# Patient Record
Sex: Female | Born: 1955 | Race: White | Hispanic: No | Marital: Married | Smoking: Never smoker
Health system: Southern US, Community
[De-identification: ages and names within clinical notes are randomized; demographics above are authoritative.]

## PROBLEM LIST (undated history)

## (undated) DIAGNOSIS — F419 Anxiety disorder, unspecified: Secondary | ICD-10-CM

## (undated) DIAGNOSIS — R51 Headache: Secondary | ICD-10-CM

## (undated) DIAGNOSIS — Z1211 Encounter for screening for malignant neoplasm of colon: Secondary | ICD-10-CM

## (undated) DIAGNOSIS — Z87442 Personal history of urinary calculi: Secondary | ICD-10-CM

## (undated) DIAGNOSIS — H269 Unspecified cataract: Secondary | ICD-10-CM

## (undated) DIAGNOSIS — H6690 Otitis media, unspecified, unspecified ear: Secondary | ICD-10-CM

## (undated) DIAGNOSIS — G709 Myoneural disorder, unspecified: Secondary | ICD-10-CM

## (undated) DIAGNOSIS — M1990 Unspecified osteoarthritis, unspecified site: Secondary | ICD-10-CM

## (undated) DIAGNOSIS — E039 Hypothyroidism, unspecified: Secondary | ICD-10-CM

## (undated) DIAGNOSIS — Z124 Encounter for screening for malignant neoplasm of cervix: Secondary | ICD-10-CM

## (undated) DIAGNOSIS — Z1239 Encounter for other screening for malignant neoplasm of breast: Secondary | ICD-10-CM

## (undated) DIAGNOSIS — R011 Cardiac murmur, unspecified: Secondary | ICD-10-CM

## (undated) DIAGNOSIS — Z8669 Personal history of other diseases of the nervous system and sense organs: Secondary | ICD-10-CM

## (undated) DIAGNOSIS — D689 Coagulation defect, unspecified: Secondary | ICD-10-CM

## (undated) DIAGNOSIS — T7840XA Allergy, unspecified, initial encounter: Secondary | ICD-10-CM

## (undated) DIAGNOSIS — R519 Headache, unspecified: Secondary | ICD-10-CM

## (undated) HISTORY — DX: Hypothyroidism, unspecified: E03.9

## (undated) HISTORY — PX: SPINE SURGERY: SHX786

## (undated) HISTORY — DX: Hypercalcemia: E83.52

## (undated) HISTORY — PX: PARTIAL HYSTERECTOMY: SHX80

## (undated) HISTORY — PX: JOINT REPLACEMENT: SHX530

## (undated) HISTORY — DX: Encounter for screening for malignant neoplasm of cervix: Z12.4

## (undated) HISTORY — DX: Anxiety disorder, unspecified: F41.9

## (undated) HISTORY — DX: Allergy, unspecified, initial encounter: T78.40XA

## (undated) HISTORY — DX: Encounter for screening for malignant neoplasm of colon: Z12.11

## (undated) HISTORY — PX: TOTAL ABDOMINAL HYSTERECTOMY: SHX209

## (undated) HISTORY — PX: CARDIAC VALVE REPLACEMENT: SHX585

## (undated) HISTORY — DX: Unspecified cataract: H26.9

## (undated) HISTORY — DX: Otitis media, unspecified, unspecified ear: H66.90

## (undated) HISTORY — PX: ANTERIOR CERVICAL DECOMP/DISCECTOMY FUSION: SHX1161

## (undated) HISTORY — DX: Coagulation defect, unspecified: D68.9

## (undated) HISTORY — DX: Unspecified osteoarthritis, unspecified site: M19.90

## (undated) HISTORY — DX: Encounter for other screening for malignant neoplasm of breast: Z12.39

## (undated) HISTORY — PX: OTHER SURGICAL HISTORY: SHX169

## (undated) HISTORY — DX: Personal history of other diseases of the nervous system and sense organs: Z86.69

## (undated) HISTORY — DX: Myoneural disorder, unspecified: G70.9

---

## 1987-09-14 HISTORY — PX: CHOLECYSTECTOMY: SHX55

## 2005-02-05 ENCOUNTER — Ambulatory Visit: Payer: Self-pay | Admitting: Unknown Physician Specialty

## 2005-02-10 ENCOUNTER — Ambulatory Visit: Payer: Self-pay | Admitting: Unknown Physician Specialty

## 2006-03-17 ENCOUNTER — Ambulatory Visit: Payer: Self-pay | Admitting: Unknown Physician Specialty

## 2007-04-18 ENCOUNTER — Ambulatory Visit: Payer: Self-pay | Admitting: Unknown Physician Specialty

## 2008-04-19 ENCOUNTER — Ambulatory Visit: Payer: Self-pay | Admitting: Unknown Physician Specialty

## 2008-05-03 ENCOUNTER — Ambulatory Visit: Payer: Self-pay | Admitting: Internal Medicine

## 2008-05-17 ENCOUNTER — Ambulatory Visit: Payer: Self-pay | Admitting: Internal Medicine

## 2008-06-07 LAB — HM COLONOSCOPY: HM Colonoscopy: NORMAL

## 2008-09-19 ENCOUNTER — Ambulatory Visit: Payer: Self-pay | Admitting: Internal Medicine

## 2009-04-22 ENCOUNTER — Ambulatory Visit: Payer: Self-pay | Admitting: Unknown Physician Specialty

## 2009-12-04 ENCOUNTER — Ambulatory Visit: Payer: Self-pay

## 2010-01-11 HISTORY — PX: MENISCUS REPAIR: SHX5179

## 2010-01-23 ENCOUNTER — Ambulatory Visit: Payer: Self-pay | Admitting: General Practice

## 2010-01-30 ENCOUNTER — Ambulatory Visit: Payer: Self-pay | Admitting: General Practice

## 2010-03-13 LAB — HM MAMMOGRAPHY: HM Mammogram: NORMAL

## 2010-06-12 ENCOUNTER — Ambulatory Visit: Payer: Self-pay | Admitting: Unknown Physician Specialty

## 2010-10-07 ENCOUNTER — Ambulatory Visit: Payer: Self-pay | Admitting: Internal Medicine

## 2010-10-09 LAB — HM DEXA SCAN

## 2011-06-23 ENCOUNTER — Ambulatory Visit: Payer: Self-pay | Admitting: Unknown Physician Specialty

## 2011-07-10 LAB — HM MAMMOGRAPHY: HM Mammogram: NORMAL

## 2011-09-02 ENCOUNTER — Telehealth: Payer: Self-pay | Admitting: Internal Medicine

## 2011-09-02 NOTE — Telephone Encounter (Signed)
[phone] Pt called is schedule to be here 09/17/11 for cpx but she will be out of her  liboxel grams  1 daily [facility] [address] [facility] get [facility] so you will know exactly what shes taking

## 2011-09-02 NOTE — Telephone Encounter (Signed)
Will wait to receive fax from pharmacy .

## 2011-09-09 ENCOUNTER — Other Ambulatory Visit: Payer: Self-pay | Admitting: *Deleted

## 2011-09-09 MED ORDER — LEVOTHYROXINE SODIUM 88 MCG PO TABS
88.0000 ug | ORAL_TABLET | Freq: Every day | ORAL | Status: DC
Start: 2011-09-09 — End: 2012-11-24

## 2011-09-09 NOTE — Telephone Encounter (Signed)
Faxed refill request from [facility] [address], last filled 06/10/11.

## 2011-09-10 ENCOUNTER — Encounter: Payer: Self-pay | Admitting: Internal Medicine

## 2011-09-14 HISTORY — PX: ANTERIOR FUSION CERVICAL SPINE: SUR626

## 2011-09-15 NOTE — Telephone Encounter (Signed)
Rx has been done. 

## 2011-09-17 ENCOUNTER — Encounter: Payer: Self-pay | Admitting: Internal Medicine

## 2011-09-17 ENCOUNTER — Ambulatory Visit (INDEPENDENT_AMBULATORY_CARE_PROVIDER_SITE_OTHER): Payer: PRIVATE HEALTH INSURANCE | Admitting: Internal Medicine

## 2011-09-17 DIAGNOSIS — Z1239 Encounter for other screening for malignant neoplasm of breast: Secondary | ICD-10-CM

## 2011-09-17 DIAGNOSIS — Z1211 Encounter for screening for malignant neoplasm of colon: Secondary | ICD-10-CM

## 2011-09-17 DIAGNOSIS — R5383 Other fatigue: Secondary | ICD-10-CM

## 2011-09-17 DIAGNOSIS — D649 Anemia, unspecified: Secondary | ICD-10-CM

## 2011-09-17 DIAGNOSIS — Z23 Encounter for immunization: Secondary | ICD-10-CM

## 2011-09-17 DIAGNOSIS — E039 Hypothyroidism, unspecified: Secondary | ICD-10-CM

## 2011-09-17 DIAGNOSIS — R5381 Other malaise: Secondary | ICD-10-CM

## 2011-09-17 DIAGNOSIS — Z124 Encounter for screening for malignant neoplasm of cervix: Secondary | ICD-10-CM

## 2011-09-17 LAB — COMPREHENSIVE METABOLIC PANEL
ALT: 20 U/L (ref 0–35)
AST: 24 U/L (ref 0–37)
Albumin: 4.4 g/dL (ref 3.5–5.2)
Alkaline Phosphatase: 70 U/L (ref 39–117)
Glucose, Bld: 87 mg/dL (ref 70–99)
Potassium: 4.2 mEq/L (ref 3.5–5.1)
Sodium: 143 mEq/L (ref 135–145)
Total Bilirubin: 0.5 mg/dL (ref 0.3–1.2)
Total Protein: 6.8 g/dL (ref 6.0–8.3)

## 2011-09-17 LAB — CBC WITH DIFFERENTIAL/PLATELET
Eosinophils Absolute: 0.1 10*3/uL (ref 0.0–0.7)
Eosinophils Relative: 2.1 % (ref 0.0–5.0)
HCT: 36.9 % (ref 36.0–46.0)
Lymphs Abs: 1.3 10*3/uL (ref 0.7–4.0)
MCHC: 34.1 g/dL (ref 30.0–36.0)
MCV: 91.8 fl (ref 78.0–100.0)
Monocytes Absolute: 0.4 10*3/uL (ref 0.1–1.0)
Neutrophils Relative %: 69 % (ref 43.0–77.0)
Platelets: 259 10*3/uL (ref 150.0–400.0)
WBC: 6.3 10*3/uL (ref 4.5–10.5)

## 2011-09-17 LAB — IRON AND TIBC: TIBC: 336 ug/dL (ref 250–470)

## 2011-09-17 LAB — TSH: TSH: 1.76 u[IU]/mL (ref 0.35–5.50)

## 2011-09-17 LAB — FERRITIN: Ferritin: 27.9 ng/mL (ref 10.0–291.0)

## 2011-09-17 NOTE — Patient Instructions (Signed)
I recommend that you join a gym for weight lifiting and aerobics to preserve your good health and increase your bone density

## 2011-09-17 NOTE — Progress Notes (Signed)
Subjective:    Patient ID: Theresa [name], female    DOB: 1956/01/30, 56 y.o.   MRN: [id number]  HPI Annual Exam-Postmenopausal Patient presents for annual exam. The patient has no complaints today. The patient is sexually active. GYN screening history: last pap: approximate date July 2012 and was normal and last mammogram: approximate date OCTOBER 2012 and was normal. The patient is not taking hormone replacement therapy. Patient denies post-menopausal vaginal bleeding. The patient wears seatbelts: yes. The patient participates in regular exercise: yes. Has the patient ever been transfused or tattooed?: no. The patient reports that there is not domestic violence in her life.  Past Medical History  Diagnosis Date  . Unspecified otitis media   . Hypercalcemia   . Breast screening, unspecified   . Special screening for malignant neoplasms, colon   . Screening for malignant neoplasm of the cervix   . Unspecified hypothyroidism     thyroid nodule  . H/O: Bell's palsy   . Osteoporosis     Past Surgical History  Procedure Date  . Partial hysterectomy     secondary to fibroids  . Meniscus repair May 2011    [facility]  . Cholecystectomy 1989  . Dental implant       Review of Systems  Constitutional: Negative for fever, chills, appetite change, fatigue and unexpected weight change.  HENT: Negative for ear pain, congestion, sore throat, trouble swallowing, neck pain, voice change and sinus pressure.   Eyes: Negative for visual disturbance.  Respiratory: Negative for cough, shortness of breath, wheezing and stridor.   Cardiovascular: Negative for chest pain, palpitations and leg swelling.  Gastrointestinal: Negative for nausea, vomiting, abdominal pain, diarrhea, constipation, blood in stool, abdominal distention and anal bleeding.  Genitourinary: Negative for dysuria, flank pain, vaginal bleeding, vaginal discharge, difficulty urinating and vaginal pain.  Musculoskeletal: Negative for  myalgias, arthralgias and gait problem.  Skin: Negative for color change and rash.  Neurological: Negative for dizziness and headaches.  Hematological: Negative for adenopathy. Does not bruise/bleed easily.  Psychiatric/Behavioral: Negative for suicidal ideas, sleep disturbance and dysphoric mood. The patient is not nervous/anxious.    BP 110/64  Pulse 64  Temp(Src) 98.3 F (36.8 C) (Oral)  Wt 141 lb (63.957 kg)     Objective:   Physical Exam  General Appearance:    Alert, cooperative, no distress, appears stated age Head:    Normocephalic, without obvious abnormality, atraumatic Eyes:    PERRL, conjunctiva/corneas clear, EOM's intact, fundi    benign, both eyes Ears:    Normal TM's and external ear canals, both ears Nose:   Nares normal, septum midline, mucosa normal, no drainage    or sinus tenderness Throat:   Lips, mucosa, and tongue normal; teeth and gums normal Neck:   Supple, symmetrical, trachea midline, no adenopathy;    thyroid:  no enlargement/tenderness/nodules; no carotid   bruit or JVD Back:     Symmetric, no curvature, ROM normal, no CVA tenderness Lungs:     Clear to auscultation bilaterally, respirations unlabored Chest Wall:    No tenderness or deformity  Heart:    Regular rate and rhythm, S1 and S2 normal, no murmur, rub   or gallop Abdomen:     Soft, non-tender, bowel sounds active all four quadrants,    no masses, no organomegaly Extremities:   Extremities normal, atraumatic, no cyanosis or edema Pulses:   2+ and symmetric all extremities Skin:   Skin color, texture, turgor normal, no rashes or lesions Lymph nodes:  Cervical, supraclavicular, and axillary nodes normal Neurologic:   CNII-XII intact, normal strength, sensation and reflexes    throughout       Assessment & Plan:   Unspecified hypothyroidism Managed with Synthroid,  TSH due  Screening for cervical cancer fone every 2 to 3 years, cervicx intact per patient.  Last one done July 2012 by  [facility]  Breast screening, unspecified Mammogram and bresat exam were done by [facility] July/Oct 2012.  Special screening for malignant neoplasms, colon Done July 2009.  Normal, Follow up in 2019.    Updated Medication List Outpatient Encounter Prescriptions as of 09/17/2011  Medication Sig Dispense Refill  . aspirin 81 MG tablet Take 81 mg by mouth daily.        [name] levothyroxine (SYNTHROID, LEVOTHROID) 88 MCG tablet Take 1 tablet (88 mcg total) by mouth daily.  90 tablet  5  . Multiple Vitamin (MULTIVITAMIN) tablet Take 1 tablet by mouth daily.        . Calcium Carbonate-Vitamin D (CALCIUM 600+D) 600-400 MG-UNIT per tablet Take by mouth as directed.       . Cholecalciferol (VITAMIN D3) 1000 UNITS CAPS Take by mouth daily.        . Coenzyme Q10 (CO Q10) 100 MG CAPS Take by mouth daily.            Subjective:          Review of Systems    Objective:        Assessment:      Plan:     See After Visit Summary for Counseling Recommendations

## 2011-09-18 DIAGNOSIS — Z1211 Encounter for screening for malignant neoplasm of colon: Secondary | ICD-10-CM | POA: Insufficient documentation

## 2011-09-18 DIAGNOSIS — Z124 Encounter for screening for malignant neoplasm of cervix: Secondary | ICD-10-CM | POA: Insufficient documentation

## 2011-09-18 DIAGNOSIS — Z1231 Encounter for screening mammogram for malignant neoplasm of breast: Secondary | ICD-10-CM | POA: Insufficient documentation

## 2011-09-18 DIAGNOSIS — E039 Hypothyroidism, unspecified: Secondary | ICD-10-CM | POA: Insufficient documentation

## 2011-09-18 DIAGNOSIS — Z1239 Encounter for other screening for malignant neoplasm of breast: Secondary | ICD-10-CM | POA: Insufficient documentation

## 2011-09-18 NOTE — Assessment & Plan Note (Signed)
Mammogram and bresat exam were done by [facility] July/Oct 2012.

## 2011-09-18 NOTE — Assessment & Plan Note (Signed)
Managed with Synthroid,  TSH due

## 2011-09-18 NOTE — Assessment & Plan Note (Signed)
fone every 2 to 3 years, cervicx intact per patient.  Last one done July 2012 by [facility]

## 2011-09-18 NOTE — Assessment & Plan Note (Signed)
Done July 2009.  Normal, Follow up in 2019.

## 2012-04-13 DIAGNOSIS — M5030 Other cervical disc degeneration, unspecified cervical region: Secondary | ICD-10-CM | POA: Insufficient documentation

## 2012-05-04 ENCOUNTER — Encounter: Payer: Self-pay | Admitting: Internal Medicine

## 2012-06-09 LAB — HM MAMMOGRAPHY: HM Mammogram: NORMAL

## 2012-10-23 ENCOUNTER — Encounter: Payer: Self-pay | Admitting: Internal Medicine

## 2012-10-23 DIAGNOSIS — M47812 Spondylosis without myelopathy or radiculopathy, cervical region: Secondary | ICD-10-CM | POA: Insufficient documentation

## 2012-11-24 ENCOUNTER — Telehealth: Payer: Self-pay | Admitting: Internal Medicine

## 2012-11-24 MED ORDER — LEVOTHYROXINE SODIUM 88 MCG PO TABS: 88.0000 ug | ORAL_TABLET | Freq: Every day | ORAL | Status: DC

## 2012-11-24 NOTE — Telephone Encounter (Signed)
See unrouted note.  

## 2012-11-24 NOTE — Telephone Encounter (Signed)
Yes ok to fil for 30 days levothyroxine 88 mcg .  E mailed.

## 2012-11-24 NOTE — Telephone Encounter (Signed)
Please advise if ok to foll with #30 and 0 refills to cover until her appt?

## 2012-11-24 NOTE — Telephone Encounter (Signed)
levothyroxine (SYNTHROID, LEVOTHROID) 88 MCG tablet   Patient has scheduled her physical ,but will run out of her medication next week.  Please put lab work in for her physical . She is schedule for the lab on 4.21.14

## 2012-11-24 NOTE — Telephone Encounter (Signed)
Med filled.  

## 2012-12-15 ENCOUNTER — Encounter: Payer: Self-pay | Admitting: Internal Medicine

## 2012-12-15 ENCOUNTER — Ambulatory Visit (INDEPENDENT_AMBULATORY_CARE_PROVIDER_SITE_OTHER): Payer: PRIVATE HEALTH INSURANCE | Admitting: Internal Medicine

## 2012-12-15 VITALS — BP 117/70 | HR 57 | Temp 98.1°F | Resp 15 | Wt 131.5 lb

## 2012-12-15 DIAGNOSIS — E039 Hypothyroidism, unspecified: Secondary | ICD-10-CM

## 2012-12-15 DIAGNOSIS — E010 Iodine-deficiency related diffuse (endemic) goiter: Secondary | ICD-10-CM

## 2012-12-15 DIAGNOSIS — E049 Nontoxic goiter, unspecified: Secondary | ICD-10-CM

## 2012-12-15 DIAGNOSIS — M47812 Spondylosis without myelopathy or radiculopathy, cervical region: Secondary | ICD-10-CM

## 2012-12-15 NOTE — Progress Notes (Signed)
Patient ID: Theresa [name], female   DOB: [birth year], 57 y.o.   MRN: [id number]   Patient Active Problem List  Diagnosis  . Screening for cervical cancer  . Special screening for malignant neoplasms, colon  . Breast screening, unspecified  . Unspecified hypothyroidism  . Cervical spine degeneration  . Thyromegaly    Subjective:  CC:   Chief Complaint  Patient presents with  . Follow-up    Patient reports swelling in area of thyroid.    HPI:   Theresa [name] a 57 y.o. female who presents with Thyroid enlargement. Patient notes that ever since her cervical spine fusion which was done in December 2013 by Dr. Jeral Fruit, she has noticed an asymmetric enlargement of her thyroid and a feeling of thickness in her esophagus which Dr. Jeral Fruit told her would remain for several months to a year after surgery. She's not having a dysphagia  . She has  a strong family history of nodules and goiters.  She is endorsing several symptoms of hypothyroidism which are nonspecific including dry skin and fatigue. She has not gained any weight. She has not had an excessive amount of hair loss. Unfortunately her neck pain has returned and she is now having epidural steroid injections. She is considering retiring early.   Past Medical History  Diagnosis Date  . Unspecified otitis media   . Hypercalcemia   . Breast screening, unspecified   . Special screening for malignant neoplasms, colon   . Screening for malignant neoplasm of the cervix   . Unspecified hypothyroidism     thyroid nodule  . H/O: Bell's palsy   . Osteoporosis     Past Surgical History  Procedure Laterality Date  . Partial hysterectomy      secondary to fibroids  . Meniscus repair  May 2011    [facility]  . Cholecystectomy  1989  . Dental implant    . Anterior fusion cervical spine  jan 2014    [facility]  c5-c6       The following portions of the patient's history were reviewed and updated as appropriate: Allergies, current medications,  and problem list.    Review of Systems:   Patient denies headache, fevers, malaise, unintentional weight loss, skin rash, eye pain, sinus congestion and sinus pain, sore throat, dysphagia,  hemoptysis , cough, dyspnea, wheezing, chest pain, palpitations, orthopnea, edema, abdominal pain, nausea, melena, diarrhea, constipation, flank pain, dysuria, hematuria, urinary  Frequency, nocturia, numbness, tingling, seizures,  Focal weakness, Loss of consciousness,  Tremor, insomnia, depression, anxiety, and suicidal ideation.     History   Social History  . Marital Status: Married    Spouse Name: N/A    Number of Children: N/A  . Years of Education: N/A   Occupational History  . dental hygienist    Social History Main Topics  . Smoking status: Never Smoker   . Smokeless tobacco: Not on file  . Alcohol Use: 3.5 oz/week    7 drink(s) per week     Comment: Occasional  . Drug Use: Not on file  . Sexually Active: Not on file   Other Topics Concern  . Not on file   Social History Narrative   Lives with spouse, son is at [facility].    Objective:  BP 117/70  Pulse 57  Temp(Src) 98.1 F (36.7 C) (Oral)  Resp 15  Wt 131 lb 8 oz (59.648 kg)  SpO2 98%  General appearance: alert, cooperative and appears stated age Ears: normal TM's and external  ear canals both ears Throat: lips, mucosa, and tongue normal; teeth and gums normal Neck: no adenopathy, no carotid bruit, supple, symmetrical, trachea midline and thyroid asymmetri, c larger on right, no nodules  Back: symmetric, no curvature. ROM normal. No CVA tenderness. Lungs: clear to auscultation bilaterally Heart: regular rate and rhythm, S1, S2 normal, no murmur, click, rub or gallop Abdomen: soft, non-tender; bowel sounds normal; no masses,  no organomegaly Pulses: 2+ and symmetric Skin: Skin color, texture, turgor normal. No rashes or lesions Lymph nodes: Cervical, supraclavicular, and axillary nodes normal.  Assessment and  Plan:  Cervical spine degeneration She underwent surgery by Dr. Jeral Fruit in December 2013 and was symptom-free for a few months but is now having recurrence of pain requiring epidural injections.  Thyromegaly She does appear to have thyroid enlargement on the right side today. However it is difficult determine whether the swelling is due to soft tissue swelling from her recent anterior cervical spine fusion. I have ordered a neck ultrasound and repeat thyroid function tests.   A total of 25 minutes was spent with patient more than half of which was spent in counseling, reviewing records from other providers and coordination of care.  Updated Medication List Outpatient Encounter Prescriptions as of 12/15/2012  Medication Sig Dispense Refill  . Calcium Carbonate-Vitamin D (CALCIUM 600+D) 600-400 MG-UNIT per tablet Take by mouth as directed.       . Cholecalciferol (VITAMIN D3) 1000 UNITS CAPS Take by mouth daily.        [name] levothyroxine (SYNTHROID, LEVOTHROID) 88 MCG tablet Take 1 tablet (88 mcg total) by mouth daily.  30 tablet  0  . Multiple Vitamin (MULTIVITAMIN) tablet Take 1 tablet by mouth daily.        [name] aspirin 81 MG tablet Take 81 mg by mouth daily.        . Coenzyme Q10 (CO Q10) 100 MG CAPS Take by mouth daily.         No facility-administered encounter medications on file as of 12/15/2012.     Orders Placed This Encounter  Procedures  . US Soft Tissue Head/Neck  . TSH + free T4    No Follow-up on file.

## 2012-12-16 ENCOUNTER — Encounter: Payer: Self-pay | Admitting: Internal Medicine

## 2012-12-16 DIAGNOSIS — E010 Iodine-deficiency related diffuse (endemic) goiter: Secondary | ICD-10-CM | POA: Insufficient documentation

## 2012-12-16 NOTE — Assessment & Plan Note (Signed)
She underwent surgery by Dr. Jeral Fruit in December 2013 and was symptom-free for a few months but is now having recurrence of pain requiring epidural injections.

## 2012-12-16 NOTE — Assessment & Plan Note (Signed)
She does appear to have thyroid enlargement on the right side today. However it is difficult determine whether the swelling is due to soft tissue swelling from her recent anterior cervical spine fusion. I have ordered a neck ultrasound and repeat thyroid function tests.

## 2012-12-18 ENCOUNTER — Encounter: Payer: Self-pay | Admitting: Internal Medicine

## 2012-12-25 ENCOUNTER — Encounter: Payer: Self-pay | Admitting: Internal Medicine

## 2012-12-25 ENCOUNTER — Ambulatory Visit (HOSPITAL_COMMUNITY)
Admission: RE | Admit: 2012-12-25 | Discharge: 2012-12-25 | Disposition: A | Discharge status: Home / Self Care | Payer: PRIVATE HEALTH INSURANCE | Source: Ambulatory Visit | Attending: Internal Medicine | Admitting: Internal Medicine

## 2012-12-25 DIAGNOSIS — E010 Iodine-deficiency related diffuse (endemic) goiter: Secondary | ICD-10-CM

## 2012-12-25 DIAGNOSIS — E049 Nontoxic goiter, unspecified: Secondary | ICD-10-CM | POA: Insufficient documentation

## 2013-01-01 ENCOUNTER — Other Ambulatory Visit: Payer: Self-pay | Admitting: Internal Medicine

## 2013-01-01 ENCOUNTER — Other Ambulatory Visit (INDEPENDENT_AMBULATORY_CARE_PROVIDER_SITE_OTHER): Payer: PRIVATE HEALTH INSURANCE

## 2013-01-01 DIAGNOSIS — R5381 Other malaise: Secondary | ICD-10-CM

## 2013-01-01 DIAGNOSIS — E785 Hyperlipidemia, unspecified: Secondary | ICD-10-CM

## 2013-01-01 NOTE — Telephone Encounter (Signed)
Rx sent to pharmacy by escript  

## 2013-01-02 ENCOUNTER — Telehealth: Payer: Self-pay | Admitting: *Deleted

## 2013-01-02 LAB — COMPREHENSIVE METABOLIC PANEL
ALT: 20 U/L (ref 0–35)
AST: 21 U/L (ref 0–37)
Alkaline Phosphatase: 59 U/L (ref 39–117)
Calcium: 9.2 mg/dL (ref 8.4–10.5)
Chloride: 105 mEq/L (ref 96–112)
Creatinine, Ser: 0.8 mg/dL (ref 0.4–1.2)
Potassium: 4.2 mEq/L (ref 3.5–5.1)

## 2013-01-02 LAB — CBC WITH DIFFERENTIAL/PLATELET
Basophils Absolute: 0 10*3/uL (ref 0.0–0.1)
Basophils Relative: 0.2 % (ref 0.0–3.0)
Eosinophils Absolute: 0.2 10*3/uL (ref 0.0–0.7)
Hemoglobin: 14.4 g/dL (ref 12.0–15.0)
Lymphocytes Relative: 17.6 % (ref 12.0–46.0)
Lymphs Abs: 1.1 10*3/uL (ref 0.7–4.0)
MCHC: 33.4 g/dL (ref 30.0–36.0)
MCV: 92.1 fl (ref 78.0–100.0)
Monocytes Absolute: 0.6 10*3/uL (ref 0.1–1.0)
Neutro Abs: 4.2 10*3/uL (ref 1.4–7.7)
RDW: 14.2 % (ref 11.5–14.6)

## 2013-01-02 LAB — LIPID PANEL
Cholesterol: 207 mg/dL — ABNORMAL HIGH (ref 0–200)
HDL: 56.1 mg/dL (ref >39.00)
Total CHOL/HDL Ratio: 4
VLDL: 18.2 mg/dL (ref 0.0–40.0)

## 2013-01-02 LAB — TSH: TSH: 1.1 u[IU]/mL (ref 0.35–5.50)

## 2013-01-02 NOTE — Telephone Encounter (Signed)
I have tubes on the pt, but no labels on them or orders in the chart, what labs and dx?  Thank you   

## 2013-01-02 NOTE — Telephone Encounter (Signed)
I have tubes on the pt, but no labels on them or orders in the chart, what labs and dx?  Thank you

## 2013-01-02 NOTE — Telephone Encounter (Signed)
I did not order anything on this patient

## 2013-01-03 ENCOUNTER — Encounter: Payer: Self-pay | Admitting: Internal Medicine

## 2013-01-05 ENCOUNTER — Ambulatory Visit (INDEPENDENT_AMBULATORY_CARE_PROVIDER_SITE_OTHER): Payer: PRIVATE HEALTH INSURANCE | Admitting: Internal Medicine

## 2013-01-05 ENCOUNTER — Encounter: Payer: Self-pay | Admitting: Internal Medicine

## 2013-01-05 VITALS — BP 118/65 | HR 60 | Temp 98.7°F | Resp 16 | Ht 66.0 in | Wt 130.5 lb

## 2013-01-05 DIAGNOSIS — E039 Hypothyroidism, unspecified: Secondary | ICD-10-CM

## 2013-01-05 DIAGNOSIS — H409 Unspecified glaucoma: Secondary | ICD-10-CM

## 2013-01-05 DIAGNOSIS — E010 Iodine-deficiency related diffuse (endemic) goiter: Secondary | ICD-10-CM

## 2013-01-05 DIAGNOSIS — Z1239 Encounter for other screening for malignant neoplasm of breast: Secondary | ICD-10-CM

## 2013-01-05 DIAGNOSIS — Z0000 Encounter for general adult medical examination without abnormal findings: Secondary | ICD-10-CM

## 2013-01-05 DIAGNOSIS — E049 Nontoxic goiter, unspecified: Secondary | ICD-10-CM

## 2013-01-05 DIAGNOSIS — Z124 Encounter for screening for malignant neoplasm of cervix: Secondary | ICD-10-CM

## 2013-01-05 DIAGNOSIS — M47812 Spondylosis without myelopathy or radiculopathy, cervical region: Secondary | ICD-10-CM

## 2013-01-05 NOTE — Progress Notes (Signed)
Patient ID: Theresa [name], female   DOB: Sep 27, 1955, 57 y.o.   MRN: [id number]    Subjective:     Theresa [name] is a 57 y.o. female and is here for a comprehensive physical exam. The patient reports no problems.  History   Social History  . Marital Status: Married    Spouse Name: N/A    Number of Children: N/A  . Years of Education: N/A   Occupational History  . dental hygienist    Social History Main Topics  . Smoking status: Never Smoker   . Smokeless tobacco: Not on file  . Alcohol Use: 3.5 oz/week    7 drink(s) per week     Comment: Occasional  . Drug Use: Not on file  . Sexually Active: Not on file   Other Topics Concern  . Not on file   Social History Narrative   Lives with spouse, son is at [facility].   Health Maintenance  Topic Date Due  . Mammogram  06/16/2012  . Influenza Vaccine  05/14/2013  . Pap Smear  03/17/2014  . Colonoscopy  06/07/2018  . Tetanus/tdap  09/16/2021    The following portions of the patient's history were reviewed and updated as appropriate: allergies, current medications, past family history, past medical history, past social history, past surgical history and problem list.  Review of Systems A comprehensive review of systems was negative.   Objective:  BP 118/65  Pulse 60  Temp(Src) 98.7 F (37.1 C) (Oral)  Resp 16  Ht 5\' 6"  (1.676 m)  Wt 130 lb 8 oz (59.194 kg)  BMI 21.07 kg/m2  SpO2 98% General appearance: alert, cooperative and appears stated age Ears: normal TM's and external ear canals both ears Throat: lips, mucosa, and tongue normal; teeth and gums normal Neck: no adenopathy, no carotid bruit, supple, symmetrical, trachea midline and thyroid not enlarged, symmetric, no tenderness/mass/nodules Back: symmetric, no curvature. ROM normal. No CVA tenderness. Lungs: clear to auscultation bilaterally Heart: regular rate and rhythm, S1, S2 normal, no murmur, click, rub or gallop Abdomen: soft, non-tender; bowel sounds  normal; no masses,  no organomegaly Pulses: 2+ and symmetric Skin: Skin color, texture, turgor normal. No rashes or lesions Lymph nodes: Cervical, supraclavicular, and axillary nodes normal.    Assessment:   Breast screening, unspecified Mammograms and breast exams are ordered and done by her gynecologist.    Cervical spine degeneration Receiving periodic C2-C4 nerve blocks for return of symptoms post surgical decompression by [facility] Dec 2013. Theresa [name] is managed with diazepam and oxycodone prescribed by [facility].   Thyromegaly Thyroid ultrasound noted enlargement of right thyroid lobe without discrete nodules.  TSH is normal on current dose of synthroid .,  Will repeat both in 6 months   Unspecified hypothyroidism Thyroid function is normal on current dose. No changes today   Glaucoma (increased eye pressure) Managed by [facility],   Screening for cervical cancer Managed by GYN, [facility]. Normal per patient Sept 2013.  Routine general medical examination at a health care facility Annual comprehensive exam was done excluding breast, pelvic and PAP smear. All screenings have been addressed .    Updated Medication List Outpatient Encounter Prescriptions as of 01/05/2013  Medication Sig Dispense Refill  . Ascorbic Acid (VITAMIN C) 1000 MG tablet Take 1,000 mg by mouth daily.      [name] aspirin 81 MG tablet Take 81 mg by mouth daily.        . Cholecalciferol (VITAMIN D3) 1000 UNITS CAPS Take by  mouth daily.        [name] levothyroxine (SYNTHROID, LEVOTHROID) 88 MCG tablet TAKE ONE TABLET BY MOUTH ONCE DAILY  30 tablet  11  . Multiple Vitamin (MULTIVITAMIN) tablet Take 1 tablet by mouth daily.        . Calcium Carbonate-Vitamin D (CALCIUM 600+D) 600-400 MG-UNIT per tablet Take by mouth as directed.       . Coenzyme Q10 (CO Q10) 100 MG CAPS Take by mouth daily.         No facility-administered encounter medications on file as of 01/05/2013.

## 2013-01-05 NOTE — Patient Instructions (Addendum)
Your recent blood work was excellent.  i would like to repeat your thyroid ultrasound and thyroid function in 6 months

## 2013-01-07 ENCOUNTER — Encounter: Payer: Self-pay | Admitting: Internal Medicine

## 2013-01-07 DIAGNOSIS — Z0001 Encounter for general adult medical examination with abnormal findings: Secondary | ICD-10-CM | POA: Insufficient documentation

## 2013-01-07 DIAGNOSIS — H409 Unspecified glaucoma: Secondary | ICD-10-CM | POA: Insufficient documentation

## 2013-01-07 NOTE — Assessment & Plan Note (Signed)
Thyroid function is normal on current dose. No changes today  

## 2013-01-07 NOTE — Assessment & Plan Note (Addendum)
Receiving periodic C2-C4 nerve blocks for return of symptoms post surgical decompression by [facility] Dec 2013. Danielle Dess is managed with diazepam and oxycodone prescribed by [facility].

## 2013-01-07 NOTE — Assessment & Plan Note (Addendum)
Thyroid ultrasound noted enlargement of right thyroid lobe without discrete nodules.  TSH is normal on current dose of synthroid .,  Will repeat both in 6 months

## 2013-01-07 NOTE — Assessment & Plan Note (Signed)
Managed by GYN, [facility]. Normal per patient Sept 2013.

## 2013-01-07 NOTE — Assessment & Plan Note (Signed)
Managed by [facility],

## 2013-01-07 NOTE — Assessment & Plan Note (Signed)
Annual comprehensive exam was done excluding breast, pelvic and PAP smear. All screenings have been addressed .  

## 2013-01-07 NOTE — Assessment & Plan Note (Signed)
Mammograms and breast exams are ordered and done by her gynecologist.

## 2013-05-14 LAB — HM PAP SMEAR: HM Pap smear: NORMAL

## 2013-06-06 ENCOUNTER — Encounter: Payer: Self-pay | Admitting: Internal Medicine

## 2013-06-06 DIAGNOSIS — E010 Iodine-deficiency related diffuse (endemic) goiter: Secondary | ICD-10-CM

## 2013-06-06 DIAGNOSIS — E039 Hypothyroidism, unspecified: Secondary | ICD-10-CM

## 2013-06-08 ENCOUNTER — Encounter: Payer: Self-pay | Admitting: Emergency Medicine

## 2013-06-08 ENCOUNTER — Other Ambulatory Visit (INDEPENDENT_AMBULATORY_CARE_PROVIDER_SITE_OTHER): Payer: PRIVATE HEALTH INSURANCE

## 2013-06-08 DIAGNOSIS — E049 Nontoxic goiter, unspecified: Secondary | ICD-10-CM

## 2013-06-08 DIAGNOSIS — E039 Hypothyroidism, unspecified: Secondary | ICD-10-CM

## 2013-06-08 DIAGNOSIS — E010 Iodine-deficiency related diffuse (endemic) goiter: Secondary | ICD-10-CM

## 2013-06-09 LAB — TSH+FREE T4: TSH: 1.5 u[IU]/mL (ref 0.450–4.500)

## 2013-06-11 ENCOUNTER — Encounter: Payer: Self-pay | Admitting: Internal Medicine

## 2013-06-13 LAB — HM MAMMOGRAPHY: HM Mammogram: NORMAL

## 2013-06-15 ENCOUNTER — Ambulatory Visit: Payer: Self-pay | Admitting: Internal Medicine

## 2013-06-19 ENCOUNTER — Telehealth: Payer: Self-pay | Admitting: Internal Medicine

## 2013-06-19 ENCOUNTER — Encounter: Payer: Self-pay | Admitting: *Deleted

## 2013-06-19 NOTE — Telephone Encounter (Signed)
Her thyroid ultrasound was normal

## 2013-06-19 NOTE — Telephone Encounter (Signed)
Letter mailed

## 2013-06-30 ENCOUNTER — Encounter: Payer: Self-pay | Admitting: Internal Medicine

## 2013-07-06 ENCOUNTER — Encounter: Payer: Self-pay | Admitting: Internal Medicine

## 2013-07-06 DIAGNOSIS — M8580 Other specified disorders of bone density and structure, unspecified site: Secondary | ICD-10-CM

## 2013-07-14 LAB — HM DEXA SCAN

## 2013-07-19 ENCOUNTER — Other Ambulatory Visit: Payer: Self-pay

## 2013-08-02 ENCOUNTER — Ambulatory Visit: Payer: Self-pay | Admitting: Internal Medicine

## 2013-08-08 ENCOUNTER — Telehealth: Payer: Self-pay | Admitting: Internal Medicine

## 2013-08-08 DIAGNOSIS — M8580 Other specified disorders of bone density and structure, unspecified site: Secondary | ICD-10-CM

## 2013-08-08 DIAGNOSIS — M810 Age-related osteoporosis without current pathological fracture: Secondary | ICD-10-CM | POA: Insufficient documentation

## 2013-08-08 NOTE — Telephone Encounter (Signed)
Bone Density scores received, she has osteopenia,  Mild to oderate.  Would repeat in 2 years and consider therapy then if there is a significant change. Continue calcium, vitamin d and weight bearing exercise on a regular basis.

## 2013-08-14 ENCOUNTER — Encounter: Payer: Self-pay | Admitting: Internal Medicine

## 2013-08-14 NOTE — Telephone Encounter (Signed)
Pt sent myChart message, requesting results. Responded via myChart.

## 2013-08-23 ENCOUNTER — Other Ambulatory Visit: Payer: Self-pay | Admitting: Neurosurgery

## 2013-08-23 DIAGNOSIS — M5412 Radiculopathy, cervical region: Secondary | ICD-10-CM

## 2013-08-30 ENCOUNTER — Encounter: Payer: Self-pay | Admitting: Internal Medicine

## 2013-08-31 ENCOUNTER — Ambulatory Visit
Admission: RE | Admit: 2013-08-31 | Discharge: 2013-08-31 | Disposition: A | Discharge status: Home / Self Care | Payer: PRIVATE HEALTH INSURANCE | Source: Ambulatory Visit | Attending: Neurosurgery | Admitting: Neurosurgery

## 2013-08-31 VITALS — BP 117/59 | HR 54

## 2013-08-31 DIAGNOSIS — M5412 Radiculopathy, cervical region: Secondary | ICD-10-CM

## 2013-08-31 DIAGNOSIS — M47812 Spondylosis without myelopathy or radiculopathy, cervical region: Secondary | ICD-10-CM

## 2013-08-31 MED ORDER — ONDANSETRON HCL 4 MG/2ML IJ SOLN: 4.0000 mg | Freq: Four times a day (QID) | INTRAMUSCULAR | Status: DC | PRN

## 2013-08-31 MED ORDER — DIAZEPAM 5 MG PO TABS: 10.0000 mg | ORAL_TABLET | Freq: Once | ORAL | Status: DC

## 2013-08-31 MED ORDER — IOHEXOL 300 MG/ML  SOLN
10.0000 mL | Freq: Once | INTRAMUSCULAR | Status: AC | PRN
  Administered 2013-08-31: 10 mL via INTRATHECAL

## 2013-09-03 DIAGNOSIS — M5412 Radiculopathy, cervical region: Secondary | ICD-10-CM | POA: Insufficient documentation

## 2013-10-14 ENCOUNTER — Telehealth: Payer: Self-pay | Admitting: Internal Medicine

## 2013-10-14 DIAGNOSIS — M8580 Other specified disorders of bone density and structure, unspecified site: Secondary | ICD-10-CM

## 2013-10-14 NOTE — Telephone Encounter (Signed)
Bone density scores have worsened.  Will need to discuss starting treatment at next visit.

## 2013-10-14 NOTE — Assessment & Plan Note (Signed)
Worsening osteopenia by repeat DEXA Nov 2014

## 2013-10-15 NOTE — Telephone Encounter (Signed)
Pt called back told her Bone Density scores have worsened and Dr. Darrick Huntsmanullo will discuss treatment at your next visit. Pt verbalized understanding.

## 2013-10-15 NOTE — Telephone Encounter (Signed)
Left message on voicemail to call office on home and mobile.  

## 2013-12-24 ENCOUNTER — Other Ambulatory Visit: Payer: Self-pay | Admitting: Internal Medicine

## 2014-01-11 ENCOUNTER — Encounter: Payer: Self-pay | Admitting: Internal Medicine

## 2014-01-11 ENCOUNTER — Ambulatory Visit (INDEPENDENT_AMBULATORY_CARE_PROVIDER_SITE_OTHER): Payer: PRIVATE HEALTH INSURANCE | Admitting: Internal Medicine

## 2014-01-11 ENCOUNTER — Encounter: Payer: PRIVATE HEALTH INSURANCE | Admitting: Internal Medicine

## 2014-01-11 VITALS — BP 108/62 | HR 63 | Temp 97.7°F | Resp 16 | Ht 66.0 in | Wt 125.0 lb

## 2014-01-11 DIAGNOSIS — M8580 Other specified disorders of bone density and structure, unspecified site: Secondary | ICD-10-CM

## 2014-01-11 DIAGNOSIS — E010 Iodine-deficiency related diffuse (endemic) goiter: Secondary | ICD-10-CM

## 2014-01-11 DIAGNOSIS — Z0000 Encounter for general adult medical examination without abnormal findings: Secondary | ICD-10-CM

## 2014-01-11 DIAGNOSIS — M47812 Spondylosis without myelopathy or radiculopathy, cervical region: Secondary | ICD-10-CM

## 2014-01-11 DIAGNOSIS — E559 Vitamin D deficiency, unspecified: Secondary | ICD-10-CM

## 2014-01-11 DIAGNOSIS — M949 Disorder of cartilage, unspecified: Secondary | ICD-10-CM

## 2014-01-11 DIAGNOSIS — E049 Nontoxic goiter, unspecified: Secondary | ICD-10-CM

## 2014-01-11 DIAGNOSIS — R634 Abnormal weight loss: Secondary | ICD-10-CM

## 2014-01-11 DIAGNOSIS — Z13220 Encounter for screening for lipoid disorders: Secondary | ICD-10-CM

## 2014-01-11 DIAGNOSIS — M899 Disorder of bone, unspecified: Secondary | ICD-10-CM

## 2014-01-11 DIAGNOSIS — E039 Hypothyroidism, unspecified: Secondary | ICD-10-CM

## 2014-01-11 LAB — COMPREHENSIVE METABOLIC PANEL
ALBUMIN: 4.6 g/dL (ref 3.5–5.2)
ALK PHOS: 72 U/L (ref 39–117)
ALT: 24 U/L (ref 0–35)
AST: 23 U/L (ref 0–37)
BUN: 13 mg/dL (ref 6–23)
CO2: 30 mEq/L (ref 19–32)
Calcium: 9.7 mg/dL (ref 8.4–10.5)
Chloride: 103 mEq/L (ref 96–112)
Creatinine, Ser: 0.7 mg/dL (ref 0.4–1.2)
GFR: 90.58 mL/min (ref >60.00)
GLUCOSE: 89 mg/dL (ref 70–99)
POTASSIUM: 4.6 meq/L (ref 3.5–5.1)
SODIUM: 141 meq/L (ref 135–145)
Total Bilirubin: 0.8 mg/dL (ref 0.3–1.2)
Total Protein: 7.2 g/dL (ref 6.0–8.3)

## 2014-01-11 LAB — TSH: TSH: 1.12 u[IU]/mL (ref 0.35–5.50)

## 2014-01-11 LAB — MAGNESIUM: Magnesium: 2 mg/dL (ref 1.5–2.5)

## 2014-01-11 MED ORDER — DULOXETINE HCL 30 MG PO CPEP: 30.0000 mg | ORAL_CAPSULE | Freq: Two times a day (BID) | ORAL | Status: DC

## 2014-01-11 NOTE — Progress Notes (Signed)
Patient ID: Theresa [name], female   DOB: 12-Jan-1956, 58 y.o.   MRN: [id number]   Subjective:     Theresa [name] is a 58 y.o. female and is here for a comprehensive physical exam. The patient reports problems - with neck pain.Theresa [name].Has had a second cervical disk fusion , initially of 5 , 6 and 7,[facility], followed by disks 3,, 4, and 5, since last visit for persistent radiculopathy and pain.  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   Takes percocet occasionally , diazepam for spasm . Currently working 5 hours daily and scheduled  to return to full time in 2 weeks,  And already having a daily pain and difficulty completing a half day .  Considering filing for disability.    History   Social History  . Marital Status: Married    Spouse Name: N/A    Number of Children: N/A  . Years of Education: N/A   Occupational History  . dental hygienist    Social History Main Topics  . Smoking status: Never Smoker   . Smokeless tobacco: Not on file  . Alcohol Use: 2.5 oz/week    5 drink(s) per week     Comment: Occasional  . Drug Use: Not on file  . Sexual Activity: Not on file   Other Topics Concern  . Not on file   Social History Narrative   Lives with spouse, [facility] [place].   Health Maintenance  Topic Date Due  . Influenza Vaccine  04/13/2014  . Mammogram  06/14/2015  . Pap Smear  05/14/2016  . Colonoscopy  06/07/2018  . Tetanus/tdap  09/16/2021    The following portions of the patient's history were reviewed and updated as appropriate: allergies, current medications, past family history, past medical history, past social history, past surgical history and problem list.  Review of Systems A comprehensive review of systems was negative.   Objective:   BP 108/62  Pulse 63  Temp(Src) 97.7 F (36.5 C) (Oral)  Resp 16  Ht 5\' 6"  (1.676 m)   Wt 125 lb (56.7 kg)  BMI 20.19 kg/m2  SpO2 95%  General appearance: alert, cooperative and appears stated age Head: Normocephalic, without obvious abnormality, atraumatic Eyes: conjunctivae/corneas clear. PERRL, EOM's intact. Fundi benign. Ears: normal TM's and external ear canals both ears Nose: Nares normal. Septum midline. Mucosa normal. No drainage or sinus tenderness. Throat: lips, mucosa, and tongue normal; teeth and gums normal Neck: no adenopathy, no carotid bruit, no JVD, supple, symmetrical, trachea midline, mild thyromegaly, asymmetric, surgical incision well healed, no tenderness/mass/nodules Lungs: clear to auscultation bilaterally Breasts: normal appearance, no masses or tenderness Heart: regular rate and rhythm, S1, S2 normal, no murmur, click, rub or gallop Abdomen: soft, non-tender; bowel sounds normal; no masses,  no organomegaly Extremities: extremities normal, atraumatic, no cyanosis or edema Pulses: 2+ and symmetric Skin: Skin color, texture, turgor normal. No rashes or lesions Neurologic: Alert and oriented X 3, normal strength and tone. Normal symmetric reflexes. Normal coordination and gait.    Assessment and plan:   Routine general medical examination at a health care facility Annual comprehensive exam was done excluding breast, pelvic and PAP smear. All screenings have been addressed .     Osteopenia Bone Density scores reviewed.  , she has osteopenia,  Mild to oderate.  Would repeat in  2 years and consider therapy then if there is a significant change. Continue calcium, vitamin d and weight bearing exercise on a regular basis.    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  tiral of cymbalta discussed for pain management.   Thyromegaly Thyroid ultrasound noted enlargement of right thyroid lobe without discrete nodules.  TSH is again  normal on current dose of synthroid . Lab Results  Component Value Date   TSH 1.12 01/11/2014       Unspecified hypothyroidism Thyroid function is WNL on current dose.  No current changes needed.   Lab Results  Component Value Date   TSH 1.12 01/11/2014     Screening for hyperlipidemia Fasting lipids are normal.  Lab Results  Component Value Date   CHOL 207* 01/02/2013   HDL 56.10 01/02/2013   LDLDIRECT 129.4 01/02/2013   TRIG 91.0 01/02/2013   CHOLHDL 4 01/02/2013      Updated Medication List Outpatient Encounter Prescriptions as of 01/11/2014  Medication Sig  . Calcium Carbonate-Vitamin D (CALCIUM 600+D) 600-400 MG-UNIT per tablet Take by mouth as directed.   . Cholecalciferol (VITAMIN D3) 1000 UNITS CAPS Take by mouth daily.    . diazepam (VALIUM) 5 MG tablet Take 1 tablet by mouth daily as needed. As needed for muscle spasm  . levothyroxine (SYNTHROID, LEVOTHROID) 88 MCG tablet TAKE ONE TABLET BY MOUTH ONCE DAILY  . Multiple Vitamin (MULTIVITAMIN) tablet Take 1 tablet by mouth daily.    Theresa [name]. oxyCODONE-acetaminophen (PERCOCET) 7.5-325 MG per tablet Take 1 tablet by mouth every 6 (six) hours as needed. As needed for pain  . Ascorbic Acid (VITAMIN C) 1000 MG tablet Take 1,000 mg by mouth daily.  Theresa [name]. aspirin 81 MG tablet Take 81 mg by mouth daily.    . Coenzyme Q10 (CO Q10) 100 MG CAPS Take by mouth daily.    . DULoxetine (CYMBALTA) 30 MG capsule Take 1 capsule (30 mg total) by mouth 2 (two) times daily.

## 2014-01-11 NOTE — Progress Notes (Signed)
Pre-visit discussion using our clinic review tool. No additional management support is needed unless otherwise documented below in the visit note.  

## 2014-01-11 NOTE — Patient Instructions (Addendum)
Trial of cymbalta for pain management.  Start with 30 mg daily after dinner.  If tolerated incease to 60 mg daily after 1-2 weeks  You have progression of osteopenia .  I want you to try to get 1800 mg calcium daily .   Your vitamin D RDA is 1000 units daily (unless your level is low today on labs)

## 2014-01-12 ENCOUNTER — Encounter: Payer: Self-pay | Admitting: Internal Medicine

## 2014-01-12 LAB — VITAMIN D 25 HYDROXY (VIT D DEFICIENCY, FRACTURES): VIT D 25 HYDROXY: 43 ng/mL (ref 30–89)

## 2014-01-13 ENCOUNTER — Encounter: Payer: Self-pay | Admitting: Internal Medicine

## 2014-01-13 DIAGNOSIS — Z13220 Encounter for screening for lipoid disorders: Secondary | ICD-10-CM

## 2014-01-13 NOTE — Assessment & Plan Note (Signed)
Thyroid function is WNL on current dose.  No current changes needed.   Lab Results  Component Value Date   TSH 1.12 01/11/2014

## 2014-01-13 NOTE — Assessment & Plan Note (Signed)
Bone Density scores reviewed.  , she has osteopenia,  Mild to oderate.  Would repeat in 2 years and consider therapy then if there is a significant change. Continue calcium, vitamin d and weight bearing exercise on a regular basis.

## 2014-01-13 NOTE — Assessment & Plan Note (Signed)
Fasting lipids are normal.  Lab Results  Component Value Date   CHOL 207* 01/02/2013   HDL 56.10 01/02/2013   LDLDIRECT 129.4 01/02/2013   TRIG 91.0 01/02/2013   CHOLHDL 4 01/02/2013

## 2014-01-13 NOTE — Assessment & Plan Note (Signed)
Annual comprehensive exam was done excluding breast, pelvic and PAP smear. All screenings have been addressed .  

## 2014-01-13 NOTE — Assessment & Plan Note (Signed)
Thyroid ultrasound noted enlargement of right thyroid lobe without discrete nodules.  TSH is again normal on current dose of synthroid . Lab Results  Component Value Date   TSH 1.12 01/11/2014

## 2014-01-13 NOTE — Assessment & Plan Note (Addendum)
With second anterior cervical disk fusion followed by incomplete relied of neck and shoulder pain, limited mobility and recurrent headaches.  tiral of cymbalta discussed for pain management.

## 2014-01-14 MED ORDER — LEVOTHYROXINE SODIUM 88 MCG PO TABS: ORAL_TABLET | ORAL | Status: DC

## 2014-03-04 DIAGNOSIS — M542 Cervicalgia: Secondary | ICD-10-CM | POA: Insufficient documentation

## 2014-05-01 ENCOUNTER — Encounter: Payer: Self-pay | Admitting: Internal Medicine

## 2014-06-25 LAB — HM MAMMOGRAPHY: HM Mammogram: NORMAL

## 2015-01-17 ENCOUNTER — Telehealth: Payer: Self-pay | Admitting: *Deleted

## 2015-01-17 ENCOUNTER — Other Ambulatory Visit (INDEPENDENT_AMBULATORY_CARE_PROVIDER_SITE_OTHER): Payer: PRIVATE HEALTH INSURANCE

## 2015-01-17 DIAGNOSIS — E038 Other specified hypothyroidism: Secondary | ICD-10-CM | POA: Diagnosis not present

## 2015-01-17 DIAGNOSIS — E034 Atrophy of thyroid (acquired): Secondary | ICD-10-CM | POA: Diagnosis not present

## 2015-01-17 DIAGNOSIS — R5383 Other fatigue: Secondary | ICD-10-CM

## 2015-01-17 DIAGNOSIS — E785 Hyperlipidemia, unspecified: Secondary | ICD-10-CM

## 2015-01-17 DIAGNOSIS — Z1159 Encounter for screening for other viral diseases: Secondary | ICD-10-CM

## 2015-01-17 LAB — CBC WITH DIFFERENTIAL/PLATELET
BASOS PCT: 1.2 % (ref 0.0–3.0)
Basophils Absolute: 0.1 10*3/uL (ref 0.0–0.1)
Eosinophils Absolute: 0.2 10*3/uL (ref 0.0–0.7)
Eosinophils Relative: 3.3 % (ref 0.0–5.0)
HCT: 41.2 % (ref 36.0–46.0)
Hemoglobin: 14.1 g/dL (ref 12.0–15.0)
LYMPHS PCT: 25.1 % (ref 12.0–46.0)
Lymphs Abs: 1.7 10*3/uL (ref 0.7–4.0)
MCHC: 34.2 g/dL (ref 30.0–36.0)
MCV: 89.8 fl (ref 78.0–100.0)
Monocytes Absolute: 0.7 10*3/uL (ref 0.1–1.0)
Monocytes Relative: 10.1 % (ref 3.0–12.0)
Neutro Abs: 4 10*3/uL (ref 1.4–7.7)
Neutrophils Relative %: 60.3 % (ref 43.0–77.0)
Platelets: 296 10*3/uL (ref 150.0–400.0)
RBC: 4.59 Mil/uL (ref 3.87–5.11)
RDW: 13.1 % (ref 11.5–15.5)
WBC: 6.7 10*3/uL (ref 4.0–10.5)

## 2015-01-17 LAB — LIPID PANEL
CHOL/HDL RATIO: 3
Cholesterol: 188 mg/dL (ref 0–200)
HDL: 62.9 mg/dL (ref >39.00)
LDL Cholesterol: 110 mg/dL — ABNORMAL HIGH (ref 0–99)
NONHDL: 125.1
Triglycerides: 78 mg/dL (ref 0.0–149.0)
VLDL: 15.6 mg/dL (ref 0.0–40.0)

## 2015-01-17 LAB — TSH: TSH: 1.64 u[IU]/mL (ref 0.35–4.50)

## 2015-01-17 LAB — COMPREHENSIVE METABOLIC PANEL
ALT: 21 U/L (ref 0–35)
AST: 21 U/L (ref 0–37)
Albumin: 4.2 g/dL (ref 3.5–5.2)
Alkaline Phosphatase: 82 U/L (ref 39–117)
BUN: 16 mg/dL (ref 6–23)
CO2: 36 mEq/L — ABNORMAL HIGH (ref 19–32)
CREATININE: 0.79 mg/dL (ref 0.40–1.20)
Calcium: 9.8 mg/dL (ref 8.4–10.5)
Chloride: 102 mEq/L (ref 96–112)
GFR: 79.79 mL/min (ref >60.00)
Glucose, Bld: 94 mg/dL (ref 70–99)
Potassium: 4.6 mEq/L (ref 3.5–5.1)
Sodium: 141 mEq/L (ref 135–145)
Total Bilirubin: 0.5 mg/dL (ref 0.2–1.2)
Total Protein: 6.8 g/dL (ref 6.0–8.3)

## 2015-01-17 NOTE — Telephone Encounter (Signed)
Labs and dx?  

## 2015-01-17 NOTE — Telephone Encounter (Signed)
Eber JonesCarolyn,  They were put in the wrong way  CBC w diff Lipid CMEt  tSh  hep C  Vit d

## 2015-01-18 LAB — HEPATITIS C ANTIBODY: HCV AB: NEGATIVE

## 2015-01-19 ENCOUNTER — Encounter: Payer: Self-pay | Admitting: Internal Medicine

## 2015-01-20 MED ORDER — LEVOTHYROXINE SODIUM 88 MCG PO TABS: ORAL_TABLET | ORAL | Status: DC

## 2015-01-24 ENCOUNTER — Encounter: Payer: Self-pay | Admitting: Internal Medicine

## 2015-01-24 ENCOUNTER — Ambulatory Visit (INDEPENDENT_AMBULATORY_CARE_PROVIDER_SITE_OTHER): Payer: PRIVATE HEALTH INSURANCE | Admitting: Internal Medicine

## 2015-01-24 VITALS — BP 122/60 | HR 60 | Temp 97.8°F | Resp 14 | Ht 66.25 in | Wt 132.5 lb

## 2015-01-24 DIAGNOSIS — E038 Other specified hypothyroidism: Secondary | ICD-10-CM

## 2015-01-24 DIAGNOSIS — Z0000 Encounter for general adult medical examination without abnormal findings: Secondary | ICD-10-CM

## 2015-01-24 DIAGNOSIS — E034 Atrophy of thyroid (acquired): Secondary | ICD-10-CM

## 2015-01-24 NOTE — Patient Instructions (Signed)
Your last bone density test was done in 2014 and had a significant change in your T score, so we will repeat it this Fall .  I recommend getting the majority of your calcium and Vitamin D  through diet rather than supplements,  given the recent association of calcium supplements with increased coronary artery calcium scores  Try the [employer] brand almond milk called "Aalmond Light" in the Original formula.  Delicious,  Low carb,  Low cal,  Cholesterol free!  Health Maintenance Adopting a healthy lifestyle and getting preventive care can go a long way to promote health and wellness. Talk with your health care provider about what schedule of regular examinations is right for you. This is a good chance for you to check in with your provider about disease prevention and staying healthy. In between checkups, there are plenty of things you can do on your own. Experts have done a lot of research about which lifestyle changes and preventive measures are most likely to keep you healthy. Ask your health care provider for more information. WEIGHT AND DIET  Eat a healthy diet  Be sure to include plenty of vegetables, fruits, low-fat dairy products, and lean protein.  Do not eat a lot of foods high in solid fats, added sugars, or salt.  Get regular exercise. This is one of the most important things you can do for your health.  Most adults should exercise for at least 150 minutes each week. The exercise should increase your heart rate and make you sweat (moderate-intensity exercise).  Most adults should also do strengthening exercises at least twice a week. This is in addition to the moderate-intensity exercise.  Maintain a healthy weight  Body mass index (BMI) is a measurement that can be used to identify possible weight problems. It estimates body fat based on height and weight. Your health care provider can help determine your BMI and help you achieve or maintain a healthy weight.  For females 57 years of  age and older:   A BMI below 18.5 is considered underweight.  A BMI of 18.5 to 24.9 is normal.  A BMI of 25 to 29.9 is considered overweight.  A BMI of 30 and above is considered obese.  Watch levels of cholesterol and blood lipids  You should start having your blood tested for lipids and cholesterol at 57 years of age, then have this test every 5 years.  You may need to have your cholesterol levels checked more often if:  Your lipid or cholesterol levels are high.  You are older than 57 years of age.  You are at high risk for heart disease.  CANCER SCREENING   Lung Cancer  Lung cancer screening is recommended for adults 57-28 years old who are at high risk for lung cancer because of a history of smoking.  A yearly low-dose CT scan of the lungs is recommended for people who:  Currently smoke.  Have quit within the past 15 years.  Have at least a 30-pack-year history of smoking. A pack year is smoking an average of one pack of cigarettes a day for 1 year.  Yearly screening should continue until it has been 15 years since you quit.  Yearly screening should stop if you develop a health problem that would prevent you from having lung cancer treatment.  Breast Cancer  Practice breast self-awareness. This means understanding how your breasts normally appear and feel.  It also means doing regular breast self-exams. Let your health care provider  know about any changes, no matter how small.  If you are in your 20s or 30s, you should have a clinical breast exam (CBE) by a health care provider every 1-3 years as part of a regular health exam.  If you are 37 or older, have a CBE every year. Also consider having a breast X-ray (mammogram) every year.  If you have a family history of breast cancer, talk to your health care provider about genetic screening.  If you are at high risk for breast cancer, talk to your health care provider about having an MRI and a mammogram every  year.  Breast cancer gene (BRCA) assessment is recommended for women who have family members with BRCA-related cancers. BRCA-related cancers include:  Breast.  Ovarian.  Tubal.  Peritoneal cancers.  Results of the assessment will determine the need for genetic counseling and BRCA1 and BRCA2 testing. Cervical Cancer Routine pelvic examinations to screen for cervical cancer are no longer recommended for nonpregnant women who are considered low risk for cancer of the pelvic organs (ovaries, uterus, and vagina) and who do not have symptoms. A pelvic examination may be necessary if you have symptoms including those associated with pelvic infections. Ask your health care provider if a screening pelvic exam is right for you.   The Pap test is the screening test for cervical cancer for women who are considered at risk.  If you had a hysterectomy for a problem that was not cancer or a condition that could lead to cancer, then you no longer need Pap tests.  If you are older than 57 years, and you have had normal Pap tests for the past 10 years, you no longer need to have Pap tests.  If you have had past treatment for cervical cancer or a condition that could lead to cancer, you need Pap tests and screening for cancer for at least 20 years after your treatment.  If you no longer get a Pap test, assess your risk factors if they change (such as having a new sexual partner). This can affect whether you should start being screened again.  Some women have medical problems that increase their chance of getting cervical cancer. If this is the case for you, your health care provider may recommend more frequent screening and Pap tests.  The human papillomavirus (HPV) test is another test that may be used for cervical cancer screening. The HPV test looks for the virus that can cause cell changes in the cervix. The cells collected during the Pap test can be tested for HPV.  The HPV test can be used to screen  women 8 years of age and older. Getting tested for HPV can extend the interval between normal Pap tests from three to five years.  An HPV test also should be used to screen women of any age who have unclear Pap test results.  After 57 years of age, women should have HPV testing as often as Pap tests.  Colorectal Cancer  This type of cancer can be detected and often prevented.  Routine colorectal cancer screening usually begins at 57 years of age and continues through 57 years of age.  Your health care provider may recommend screening at an earlier age if you have risk factors for colon cancer.  Your health care provider may also recommend using home test kits to check for hidden blood in the stool.  A small camera at the end of a tube can be used to examine your colon directly (  sigmoidoscopy or colonoscopy). This is done to check for the earliest forms of colorectal cancer.  Routine screening usually begins at age 25.  Direct examination of the colon should be repeated every 5-10 years through 57 years of age. However, you may need to be screened more often if early forms of precancerous polyps or small growths are found. Skin Cancer  Check your skin from head to toe regularly.  Tell your health care provider about any new moles or changes in moles, especially if there is a change in a mole's shape or color.  Also tell your health care provider if you have a mole that is larger than the size of a pencil eraser.  Always use sunscreen. Apply sunscreen liberally and repeatedly throughout the day.  Protect yourself by wearing long sleeves, pants, a wide-brimmed hat, and sunglasses whenever you are outside. HEART DISEASE, DIABETES, AND HIGH BLOOD PRESSURE   Have your blood pressure checked at least every 1-2 years. High blood pressure causes heart disease and increases the risk of stroke.  If you are between 35 years and 67 years old, ask your health care provider if you should take  aspirin to prevent strokes.  Have regular diabetes screenings. This involves taking a blood sample to check your fasting blood sugar level.  If you are at a normal weight and have a low risk for diabetes, have this test once every three years after 57 years of age.  If you are overweight and have a high risk for diabetes, consider being tested at a younger age or more often. PREVENTING INFECTION  Hepatitis B  If you have a higher risk for hepatitis B, you should be screened for this virus. You are considered at high risk for hepatitis B if:  You were born in a country where hepatitis B is common. Ask your health care provider which countries are considered high risk.  Your parents were born in a high-risk country, and you have not been immunized against hepatitis B (hepatitis B vaccine).  You have HIV or AIDS.  You use needles to inject street drugs.  You live with someone who has hepatitis B.  You have had sex with someone who has hepatitis B.  You get hemodialysis treatment.  You take certain medicines for conditions, including cancer, organ transplantation, and autoimmune conditions. Hepatitis C  Blood testing is recommended for:  Everyone born from [birth year] through 1963.  Anyone with known risk factors for hepatitis C. Sexually transmitted infections (STIs)  You should be screened for sexually transmitted infections (STIs) including gonorrhea and chlamydia if:  You are sexually active and are younger than 57 years of age.  You are older than 57 years of age and your health care provider tells you that you are at risk for this type of infection.  Your sexual activity has changed since you were last screened and you are at an increased risk for chlamydia or gonorrhea. Ask your health care provider if you are at risk.  If you do not have HIV, but are at risk, it may be recommended that you take a prescription medicine daily to prevent HIV infection. This is called  pre-exposure prophylaxis (PrEP). You are considered at risk if:  You are sexually active and do not regularly use condoms or know the HIV status of your partner(s).  You take drugs by injection.  You are sexually active with a partner who has HIV. Talk with your health care provider about whether you are at  high risk of being infected with HIV. If you choose to begin PrEP, you should first be tested for HIV. You should then be tested every 3 months for as long as you are taking PrEP.  PREGNANCY   If you are premenopausal and you may become pregnant, ask your health care provider about preconception counseling.  If you may become pregnant, take 400 to 800 micrograms (mcg) of folic acid every day.  If you want to prevent pregnancy, talk to your health care provider about birth control (contraception). OSTEOPOROSIS AND MENOPAUSE   Osteoporosis is a disease in which the bones lose minerals and strength with aging. This can result in serious bone fractures. Your risk for osteoporosis can be identified using a bone density scan.  If you are 43 years of age or older, or if you are at risk for osteoporosis and fractures, ask your health care provider if you should be screened.  Ask your health care provider whether you should take a calcium or vitamin D supplement to lower your risk for osteoporosis.  Menopause may have certain physical symptoms and risks.  Hormone replacement therapy may reduce some of these symptoms and risks. Talk to your health care provider about whether hormone replacement therapy is right for you.  HOME CARE INSTRUCTIONS   Schedule regular health, dental, and eye exams.  Stay current with your immunizations.   Do not use any tobacco products including cigarettes, chewing tobacco, or electronic cigarettes.  If you are pregnant, do not drink alcohol.  If you are breastfeeding, limit how much and how often you drink alcohol.  Limit alcohol intake to no more than 1  drink per day for nonpregnant women. One drink equals 12 ounces of beer, 5 ounces of wine, or 1 ounces of hard liquor.  Do not use street drugs.  Do not share needles.  Ask your health care provider for help if you need support or information about quitting drugs.  Tell your health care provider if you often feel depressed.  Tell your health care provider if you have ever been abused or do not feel safe at home. Document Released: 03/15/2011 Document Revised: 01/14/2014 Document Reviewed: 08/01/2013 [facility] Patient Information 2015 [place], [place]. This information is not intended to replace advice given to you by your health care provider. Make sure you discuss any questions you have with your health care provider.

## 2015-01-24 NOTE — Progress Notes (Signed)
Patient ID: Theresa [name], female   DOB: 05/10/1956, 59 y.o.   MRN: [id number]   Subjective:     Theresa [name] is a 59 y.o. female and is here for a comprehensive physical exam. The patient reports no problems.  History   Social History  . Marital Status: Married    Spouse Name: N/A  . Number of Children: N/A  . Years of Education: N/A   Occupational History  . dental hygienist    Social History Main Topics  . Smoking status: Never Smoker   . Smokeless tobacco: Not on file  . Alcohol Use: 2.5 oz/week    5 drink(s) per week     Comment: Occasional  . Drug Use: Not on file  . Sexual Activity: Not on file   Other Topics Concern  . Not on file   Social History Narrative   Lives with spouse, [facility].   Health Maintenance  Topic Date Due  . HIV Screening  04/04/1973  . INFLUENZA VACCINE  04/14/2015  . PAP SMEAR  05/14/2016  . MAMMOGRAM  06/25/2016  . COLONOSCOPY  06/07/2018  . TETANUS/TDAP  09/16/2021    The following portions of the patient's history were reviewed and updated as appropriate: allergies, current medications, past family history, past medical history, past social history, past surgical history and problem list.  Review of Systems  Patient denies headache, fevers, malaise, unintentional weight loss, skin rash, eye pain, sinus congestion and sinus pain, sore throat, dysphagia,  hemoptysis , cough, dyspnea, wheezing, chest pain, palpitations, orthopnea, edema, abdominal pain, nausea, melena, diarrhea, constipation, flank pain, dysuria, hematuria, urinary  Frequency, nocturia, numbness, tingling, seizures,  Focal weakness, Loss of consciousness,  Tremor, insomnia, depression, anxiety, and suicidal ideation.      Objective:   BP 122/60 mmHg  Pulse 60  Temp(Src) 97.8 F (36.6 C) (Oral)  Resp 14  Ht 5' 6.25" (1.683 m)  Wt 132 lb 8 oz (60.102 kg)  BMI 21.22 kg/m2  SpO2 98%   General appearance: alert, cooperative and appears stated  age Head: Normocephalic, without obvious abnormality, atraumatic Eyes: conjunctivae/corneas clear. PERRL, EOM's intact. Fundi benign. Ears: normal TM's and external ear canals both ears Nose: Nares normal. Septum midline. Mucosa normal. No drainage or sinus tenderness. Throat: lips, mucosa, and tongue normal; teeth and gums normal Neck: no adenopathy, no carotid bruit, no JVD, supple, symmetrical, trachea midline and thyroid not enlarged, symmetric, no tenderness/mass/nodules Lungs: clear to auscultation bilaterally Breasts: normal appearance, no masses or tenderness Heart: regular rate and rhythm, S1, S2 normal, no murmur, click, rub or gallop Abdomen: soft, non-tender; bowel sounds normal; no masses,  no organomegaly Extremities: extremities normal, atraumatic, no cyanosis or edema Pulses: 2+ and symmetric Skin: Skin color, texture, turgor normal. No rashes or lesions Neurologic: Alert and oriented X 3, normal strength and tone. Normal symmetric reflexes. Normal coordination and gait.   Assessment and Plan:   Hypothyroidism Thyroid function is WNL on current dose.  No current changes needed.   Lab Results  Component Value Date   TSH 1.64 01/17/2015      Visit for preventive health examination Annual wellness  exam was done as well as a comprehensive physical exam and management of acute and chronic conditions .  During the course of the visit the patient was educated and counseled about appropriate screening and preventive services including :  diabetes screening, lipid analysis with projected  10 year  risk for CAD , nutrition counseling,  colorectal cancer screening, and recommended immunizations.  Printed recommendations for health maintenance screenings was given.     Updated Medication List Outpatient Encounter Prescriptions as of 01/24/2015  Medication Sig  . aspirin 81 MG tablet Take 81 mg by mouth daily.    . Calcium Carbonate-Vitamin D (CALCIUM 600+D) 600-400 MG-UNIT per  tablet Take by mouth as directed.   . Cholecalciferol (VITAMIN D3) 1000 UNITS CAPS Take by mouth daily.    [name]. levothyroxine (SYNTHROID, LEVOTHROID) 88 MCG tablet TAKE ONE TABLET BY MOUTH ONCE DAILY  . Multiple Vitamin (MULTIVITAMIN) tablet Take 1 tablet by mouth daily.    . [DISCONTINUED] Ascorbic Acid (VITAMIN C) 1000 MG tablet Take 1,000 mg by mouth daily.  . [DISCONTINUED] Coenzyme Q10 (CO Q10) 100 MG CAPS Take by mouth daily.    . [DISCONTINUED] diazepam (VALIUM) 5 MG tablet Take 1 tablet by mouth daily as needed. As needed for muscle spasm  . [DISCONTINUED] DULoxetine (CYMBALTA) 30 MG capsule Take 1 capsule (30 mg total) by mouth 2 (two) times daily.  . [DISCONTINUED] oxyCODONE-acetaminophen (PERCOCET) 7.5-325 MG per tablet Take 1 tablet by mouth every 6 (six) hours as needed. As needed for pain   No facility-administered encounter medications on file as of 01/24/2015.

## 2015-01-24 NOTE — Progress Notes (Signed)
Pre visit review using our clinic review tool, if applicable. No additional management support is needed unless otherwise documented below in the visit note. 

## 2015-01-26 NOTE — Assessment & Plan Note (Signed)

## 2015-01-26 NOTE — Assessment & Plan Note (Signed)
Thyroid function is WNL on current dose.  No current changes needed.   Lab Results  Component Value Date   TSH 1.64 01/17/2015

## 2015-01-27 MED ORDER — LEVOTHYROXINE SODIUM 88 MCG PO TABS: ORAL_TABLET | ORAL | Status: DC

## 2015-01-27 NOTE — Telephone Encounter (Signed)
Refill for Levothyroxine sent as requested.

## 2015-04-25 ENCOUNTER — Telehealth: Payer: Self-pay | Admitting: Internal Medicine

## 2015-04-25 DIAGNOSIS — M8580 Other specified disorders of bone density and structure, unspecified site: Secondary | ICD-10-CM

## 2015-04-25 DIAGNOSIS — Z1239 Encounter for other screening for malignant neoplasm of breast: Secondary | ICD-10-CM

## 2015-04-25 NOTE — Telephone Encounter (Signed)
Pt called to request a mammogram (needs to be in November, and scheduled at [facility]) and bone density scan. Please advise/msn

## 2015-04-29 NOTE — Telephone Encounter (Signed)
Orders in please schedule

## 2015-06-20 ENCOUNTER — Encounter: Payer: Self-pay | Admitting: Internal Medicine

## 2015-07-18 ENCOUNTER — Ambulatory Visit
Admission: RE | Admit: 2015-07-18 | Discharge: 2015-07-18 | Disposition: A | Discharge status: Home / Self Care | Payer: PRIVATE HEALTH INSURANCE | Source: Ambulatory Visit | Attending: Internal Medicine | Admitting: Internal Medicine

## 2015-07-18 DIAGNOSIS — Z1231 Encounter for screening mammogram for malignant neoplasm of breast: Secondary | ICD-10-CM | POA: Insufficient documentation

## 2015-07-18 DIAGNOSIS — Z1239 Encounter for other screening for malignant neoplasm of breast: Secondary | ICD-10-CM

## 2015-08-04 ENCOUNTER — Ambulatory Visit
Admission: RE | Admit: 2015-08-04 | Discharge: 2015-08-04 | Disposition: A | Discharge status: Home / Self Care | Payer: PRIVATE HEALTH INSURANCE | Source: Ambulatory Visit | Attending: Internal Medicine | Admitting: Internal Medicine

## 2015-08-04 DIAGNOSIS — M8580 Other specified disorders of bone density and structure, unspecified site: Secondary | ICD-10-CM | POA: Insufficient documentation

## 2015-08-04 DIAGNOSIS — Z13820 Encounter for screening for osteoporosis: Secondary | ICD-10-CM | POA: Insufficient documentation

## 2015-08-10 ENCOUNTER — Encounter: Payer: Self-pay | Admitting: Internal Medicine

## 2015-08-17 ENCOUNTER — Other Ambulatory Visit: Payer: Self-pay | Admitting: Internal Medicine

## 2015-11-14 ENCOUNTER — Other Ambulatory Visit: Payer: Self-pay | Admitting: Internal Medicine

## 2016-01-30 ENCOUNTER — Encounter: Payer: Self-pay | Admitting: Internal Medicine

## 2016-01-30 ENCOUNTER — Ambulatory Visit (INDEPENDENT_AMBULATORY_CARE_PROVIDER_SITE_OTHER): Payer: No Typology Code available for payment source | Admitting: Internal Medicine

## 2016-01-30 VITALS — BP 118/70 | HR 68 | Temp 98.0°F | Ht 66.0 in | Wt 133.0 lb

## 2016-01-30 DIAGNOSIS — F5105 Insomnia due to other mental disorder: Secondary | ICD-10-CM

## 2016-01-30 DIAGNOSIS — E559 Vitamin D deficiency, unspecified: Secondary | ICD-10-CM

## 2016-01-30 DIAGNOSIS — E034 Atrophy of thyroid (acquired): Secondary | ICD-10-CM

## 2016-01-30 DIAGNOSIS — Z90710 Acquired absence of both cervix and uterus: Secondary | ICD-10-CM | POA: Diagnosis not present

## 2016-01-30 DIAGNOSIS — Z0000 Encounter for general adult medical examination without abnormal findings: Secondary | ICD-10-CM

## 2016-01-30 DIAGNOSIS — M8580 Other specified disorders of bone density and structure, unspecified site: Secondary | ICD-10-CM

## 2016-01-30 DIAGNOSIS — F419 Anxiety disorder, unspecified: Secondary | ICD-10-CM

## 2016-01-30 DIAGNOSIS — M47812 Spondylosis without myelopathy or radiculopathy, cervical region: Secondary | ICD-10-CM

## 2016-01-30 DIAGNOSIS — E038 Other specified hypothyroidism: Secondary | ICD-10-CM

## 2016-01-30 DIAGNOSIS — R5382 Chronic fatigue, unspecified: Secondary | ICD-10-CM | POA: Diagnosis not present

## 2016-01-30 DIAGNOSIS — E785 Hyperlipidemia, unspecified: Secondary | ICD-10-CM

## 2016-01-30 MED ORDER — PREDNISONE 10 MG PO TABS: ORAL_TABLET | ORAL | Status: DC

## 2016-01-30 MED ORDER — RALOXIFENE HCL 60 MG PO TABS: 60.0000 mg | ORAL_TABLET | Freq: Every day | ORAL | Status: DC

## 2016-01-30 MED ORDER — ALPRAZOLAM 0.25 MG PO TABS: 0.2500 mg | ORAL_TABLET | Freq: Two times a day (BID) | ORAL | Status: DC | PRN

## 2016-01-30 NOTE — Progress Notes (Signed)
Patient ID: Theresa [name], female    DOB: 1956-03-18  Age: 60 y.o. MRN: [id number]  The patient is here for annual PHYSICAL examination and management of other chronic and acute problems.   PAP SMEARS done by Chad Side 2014 mammogram normal 2016 Normal colonoscopy 2009  [place]  Hep c neg 2016   Started having recurrence of neck problems a month ago , evidenced by recurrent occipital headaches.  She has a history of two prior cervical disk operations, the most recent surgery by Hilda Lias 2015 .  She has had fusion of multiple vertebra She has developed throat tightness, which in the past occurred with nerve impingement.  She has no radiculopathic symptoms and has not called botero yet.  The risk factors are reflected in the social history.  The roster of all physicians providing medical care to patient - is listed in the Snapshot section of the chart.   Home safety : The patient has smoke detectors in the home. They wear seatbelts.  There are no firearms at home. There is no violence in the home.   There is no risks for hepatitis, STDs or HIV. There is no   history of blood transfusion. They have no travel history to infectious disease endemic areas of the world.  The patient has seen their dentist in the last six month. They have seen their eye doctor in the last year.    They do not  have excessive sun exposure. Discussed the need for sun protection: hats, long sleeves and use of sunscreen if there is significant sun exposure.   Diet: the importance of a healthy diet is discussed. They do have a healthy diet.  The benefits of regular aerobic exercise were discussed. She walks 4 times per week ,  20 minutes.   Depression screen: there are no signs or vegative symptoms of depression- irritability, change in appetite, anhedonia, sadness/tearfullness.   The following portions of the patient's history were reviewed and updated as appropriate: allergies, current medications, past  family history, past medical history,  past surgical history, past social history  and problem list.  Visual acuity was not assessed per patient preference since she has regular follow up with her ophthalmologist. Hearing and body mass index were assessed and reviewed.   During the course of the visit the patient was educated and counseled about appropriate screening and preventive services including : fall prevention , diabetes screening, nutrition counseling, colorectal cancer screening, and recommended immunizations.    CC: The primary encounter diagnosis was Hypothyroidism due to acquired atrophy of thyroid. Diagnoses of Chronic fatigue, Hyperlipidemia, Vitamin D deficiency, S/P TAH (total abdominal hysterectomy), Visit for preventive health examination, Cervical spine degeneration, Insomnia secondary to anxiety, and Osteopenia were also pertinent to this visit.  History Allyah has a past medical history of Unspecified otitis media; Hypercalcemia; Breast screening, unspecified; Special screening for malignant neoplasms, colon; Screening for malignant neoplasm of the cervix; Unspecified hypothyroidism; H/O: Bell's palsy; and Osteoporosis.   She has past surgical history that includes Partial hysterectomy; Meniscus repair (May 2011); Cholecystectomy (1989); dental implant; Anterior fusion cervical spine (jan 2014); Spine surgery; and Anterior cervical decomp/discectomy fusion (Dec 2014).   Her family history includes Alzheimer's disease in her mother; Breast cancer (age of onset: 36) in her sister; Cancer (age of onset: 20) in her paternal aunt and sister; Hyperlipidemia in her father; Thyroid nodules in her maternal grandmother, mother, and sister.She reports that she has never smoked. She does not have any smokeless  tobacco history on file. She reports that she drinks about 2.5 oz of alcohol per week. Her drug history is not on file.  Outpatient Prescriptions Prior to Visit  Medication Sig  Dispense Refill  . aspirin 81 MG tablet Take 81 mg by mouth daily.      . Calcium Carbonate-Vitamin D (CALCIUM 600+D) 600-400 MG-UNIT per tablet Take by mouth as directed.     . Cholecalciferol (VITAMIN D3) 1000 UNITS CAPS Take by mouth daily.      [name] levothyroxine (SYNTHROID, LEVOTHROID) 88 MCG tablet TAKE ONE TABLET BY MOUTH ONCE DAILY 90 tablet 0  . Multiple Vitamin (MULTIVITAMIN) tablet Take 1 tablet by mouth daily.       No facility-administered medications prior to visit.    Review of Systems  Patient denies  fevers, malaise, unintentional weight loss, skin rash, eye pain, sinus congestion and sinus pain, sore throat,   hemoptysis , cough, dyspnea, wheezing, chest pain, palpitations, orthopnea, edema, abdominal pain, nausea, melena, diarrhea, constipation, flank pain, dysuria, hematuria, urinary  Frequency, nocturia, numbness, tingling, seizures,  Focal weakness, Loss of consciousness,  Tremor,, depression,  and suicidal ideation.     Objective:  BP 118/70 mmHg  Pulse 68  Temp(Src) 98 F (36.7 C) (Oral)  Ht  (1.676 m)  Wt 133 lb (60.328 kg)  BMI 21.48 kg/m2  Physical Exam   General Appearance:    Alert, cooperative, no distress, appears stated age  Head:    Normocephalic, without obvious abnormality, atraumatic  Eyes:    PERRL, conjunctiva/corneas clear, EOM's intact, fundi    benign, both eyes  Ears:    Normal TM's and external ear canals, both ears  Nose:   Nares normal, septum midline, mucosa normal, no drainage    or sinus tenderness  Throat:   Lips, mucosa, and tongue normal; teeth and gums normal  Neck:   Supple, symmetrical, trachea midline, no adenopathy;    thyroid:  no enlargement/tenderness/nodules; no carotid   bruit or JVD  Back:     Symmetric, no curvature, ROM normal, no CVA tenderness  Lungs:     Clear to auscultation bilaterally, respirations unlabored  Chest Wall:    No tenderness or deformity   Heart:    Regular rate and rhythm, S1 and S2 normal,  no murmur, rub   or gallop  Breast Exam:    No tenderness, masses, or nipple abnormality  Abdomen:     Soft, non-tender, bowel sounds active all four quadrants,    no masses, no organomegaly  Genitalia:    Pelvic: cervix surgically absent, external genitalia normal, no adnexal masses or tenderness,  rectovaginal septum normal, uterus surgically  absent and vagina normal without discharge  Extremities:   Extremities normal, atraumatic, no cyanosis or edema  Pulses:   2+ and symmetric all extremities  Skin:   Skin color, texture, turgor normal, no rashes or lesions  Lymph nodes:   Cervical, supraclavicular, and axillary nodes normal  Neurologic:   CNII-XII intact, normal strength, sensation and reflexes    throughout      Assessment & Plan:   Problem List Items Addressed This Visit    Cervical spine degeneration    She has undergone ACDF of C3-C5 one year after ACDF of C5 to C7,  Symptoms currently endorsing  Throat tightness,  Mild dysphagia.  Trial of prednisone taper.       Relevant Medications   predniSONE (DELTASONE) 10 MG tablet   Visit for preventive health examination  Annual comprehensive preventive exam was done as well as an evaluation and management of chronic conditions .  During the course of the visit the patient was educated and counseled about appropriate screening and preventive services including :  diabetes screening, lipid analysis with projected  10 year  risk for CAD , nutrition counseling, breast, cervical and colorectal cancer screening, and recommended immunizations.  Printed recommendations for health maintenance screenings was given.       Osteopenia    she has osteopenia,  Mild to moderate.   No change by 2 yr follow u p Nov 2016 DEXA  T Score -2.[name]. discussed trial of Evista,  No contraiindications       Insomnia secondary to anxiety    Early morning waking occurring 3-4 times per week. Contributors include constant worry about her adult son who is  clinically depressed.  Discussed prn use of alprazolam,  The risks and benefits of benzodiazepine use were discussed with patient today including excessive sedation leading to respiratory depression,  impaired thinking/driving, and addiction.  Patient was advised to avoid concurrent use with alcohol, to use medication only as needed and not to share with others  .        Relevant Medications   ALPRAZolam (XANAX) 0.25 MG tablet   S/P TAH (total abdominal hysterectomy)   Hypothyroidism - Primary   Relevant Orders   TSH    Other Visit Diagnoses    Chronic fatigue        Relevant Orders    Comprehensive metabolic panel    CBC with Differential/Platelet    Lipid panel    HIV antibody    Hyperlipidemia        Vitamin D deficiency        Relevant Orders    VITAMIN D 25 Hydroxy (Vit-D Deficiency, Fractures)       I am having Ms. Kosmicki start on ALPRAZolam, predniSONE, and raloxifene. I am also having her maintain her Calcium Carbonate-Vitamin D, multivitamin, aspirin, Vitamin D3, and levothyroxine.  Meds ordered this encounter  Medications  . ALPRAZolam (XANAX) 0.25 MG tablet    Sig: Take 1 tablet (0.25 mg total) by mouth 2 (two) times daily as needed for anxiety.    Dispense:  30 tablet    Refill:  5  . predniSONE (DELTASONE) 10 MG tablet    Sig: 6 tablets on Day 1 , then reduce by 1 tablet daily until gone    Dispense:  21 tablet    Refill:  0  . raloxifene (EVISTA) 60 MG tablet    Sig: Take 1 tablet (60 mg total) by mouth daily.    Dispense:  30 tablet    Refill:  5    There are no discontinued medications.  Follow-up: Return in about 5 days (around 02/04/2016), or fasting labs  asap .   Sherlene ShamsULLO, Beverlyann Broxterman L, MD

## 2016-01-30 NOTE — Progress Notes (Signed)
Pre visit review using our clinic review tool, if applicable. No additional management support is needed unless otherwise documented below in the visit note. 

## 2016-01-30 NOTE — Patient Instructions (Signed)
6 day Prednisone taper fo ryour neck  Alprazolam low dose for early morning waking  Evista for osteopenia (T scores -2.3 )  Raloxifene tablets What is this medicine? RALOXIFENE (ral OX i feen) reduces the amount of calcium lost from bones. It is used to treat and prevent osteoporosis in women who have experienced menopause. This medicine may be used for other purposes; ask your health care provider or pharmacist if you have questions. What should I tell my health care provider before I take this medicine? They need to know if you have any of these conditions: -a history of blood clots -cancer -heart failure -liver disease -premenopausal -an unusual or allergic reaction to raloxifene, other medicines, foods, dyes, or preservatives -pregnant or trying to get pregnant -breast-feeding How should I use this medicine? Take this medicine by mouth with a glass of water. Follow the directions on the prescription label. The tablets can be taken with or without food. Take your doses at regular intervals. Do not take your medicine more often than directed. Talk to your pediatrician regarding the use of this medicine in children. Special care may be needed. Overdosage: If you think you have taken too much of this medicine contact a poison control center or emergency room at once. NOTE: This medicine is only for you. Do not share this medicine with others. What if I miss a dose? If you miss a dose, take it as soon as you can. If it is almost time for your next dose, take only that dose. Do not take double or extra doses. What may interact with this medicine? -ampicillin -cholestyramine -colestipol -diazepam -diazoxide -female hormones like hormone replacement therapy -lidocaine -warfarin This list may not describe all possible interactions. Give your health care provider a list of all the medicines, herbs, non-prescription drugs, or dietary supplements you use. Also tell them if you smoke,  drink alcohol, or use illegal drugs. Some items may interact with your medicine. What should I watch for while using this medicine? Visit your doctor or health care professional for regular checks on your progress. Do not stop taking this medicine except on the advice of your doctor or health care professional. You should make sure you get enough calcium and vitamin D in your diet while you are taking this medicine. Discuss your dietary needs with your health care professional or nutritionist. Exercise may help to prevent bone loss. Discuss your exercise needs with your doctor or health care professional. This medicine can rarely cause blood clots. You should avoid long periods of bed rest while taking this medicine. If you are going to have surgery, tell your doctor or health care professional that you are taking this medicine. This medicine should be stopped at least 3 days before surgery. After surgery, it should be restarted only after you are walking again. It should not be restarted while you still need long periods of bed rest. You should not smoke while taking this medicine. Smoking may also increase your risk of blood clots. Smoking can also decrease the effects of this medicine. This medicine does not prevent hot flashes. It may cause hot flashes in some patients at the start of therapy. What side effects may I notice from receiving this medicine? Side effects that you should report to your doctor or health care professional as soon as possible: -change in vision -chest pain -difficulty breathing -leg pain or swelling -skin rash, itching Side effects that usually do not require medical attention (report to your doctor or  health care professional if they continue or are bothersome): -fluid build-up -leg cramps -stomach pain -sweating This list may not describe all possible side effects. Call your doctor for medical advice about side effects. You may report side effects to FDA at  1-800-FDA-1088. Where should I keep my medicine? Keep out of the reach of children. Store at room temperature between 15 and 30 degrees C (59 and 86 degrees F). Throw away any unused medicine after the expiration date. NOTE: This sheet is a summary. It may not cover all possible information. If you have questions about this medicine, talk to your doctor, pharmacist, or health care provider.    2016, Elsevier/Gold Standard. (2008-08-15 15:15:14)  Menopause is a normal process in which your reproductive ability comes to an end. This process happens gradually over a span of months to years, usually between the ages of 9 and 51. Menopause is complete when you have missed 12 consecutive menstrual periods. It is important to talk with your health care provider about some of the most common conditions that affect postmenopausal women, such as heart disease, cancer, and bone loss (osteoporosis). Adopting a healthy lifestyle and getting preventive care can help to promote your health and wellness. Those actions can also lower your chances of developing some of these common conditions. WHAT SHOULD I KNOW ABOUT MENOPAUSE? During menopause, you may experience a number of symptoms, such as:  Moderate-to-severe hot flashes.  Night sweats.  Decrease in sex drive.  Mood swings.  Headaches.  Tiredness.  Irritability.  Memory problems.  Insomnia. Choosing to treat or not to treat menopausal changes is an individual decision that you make with your health care provider. WHAT SHOULD I KNOW ABOUT HORMONE REPLACEMENT THERAPY AND SUPPLEMENTS? Hormone therapy products are effective for treating symptoms that are associated with menopause, such as hot flashes and night sweats. Hormone replacement carries certain risks, especially as you become older. If you are thinking about using estrogen or estrogen with progestin treatments, discuss the benefits and risks with your health care provider. WHAT SHOULD I  KNOW ABOUT HEART DISEASE AND STROKE? Heart disease, heart attack, and stroke become more likely as you age. This may be due, in part, to the hormonal changes that your body experiences during menopause. These can affect how your body processes dietary fats, triglycerides, and cholesterol. Heart attack and stroke are both medical emergencies. There are many things that you can do to help prevent heart disease and stroke:  Have your blood pressure checked at least every 1-2 years. High blood pressure causes heart disease and increases the risk of stroke.  If you are 7-72 years old, ask your health care provider if you should take aspirin to prevent a heart attack or a stroke.  Do not use any tobacco products, including cigarettes, chewing tobacco, or electronic cigarettes. If you need help quitting, ask your health care provider.  It is important to eat a healthy diet and maintain a healthy weight.  Be sure to include plenty of vegetables, fruits, low-fat dairy products, and lean protein.  Avoid eating foods that are high in solid fats, added sugars, or salt (sodium).  Get regular exercise. This is one of the most important things that you can do for your health.  Try to exercise for at least 150 minutes each week. The type of exercise that you do should increase your heart rate and make you sweat. This is known as moderate-intensity exercise.  Try to do strengthening exercises at least twice each  week. Do these in addition to the moderate-intensity exercise.  Know your numbers.Ask your health care provider to check your cholesterol and your blood glucose. Continue to have your blood tested as directed by your health care provider. WHAT SHOULD I KNOW ABOUT CANCER SCREENING? There are several types of cancer. Take the following steps to reduce your risk and to catch any cancer development as early as possible. Breast Cancer  Practice breast self-awareness.  This means understanding how  your breasts normally appear and feel.  It also means doing regular breast self-exams. Let your health care provider know about any changes, no matter how small.  If you are 40 or older, have a clinician do a breast exam (clinical breast exam or CBE) every year. Depending on your age, family history, and medical history, it may be recommended that you also have a yearly breast X-ray (mammogram).  If you have a family history of breast cancer, talk with your health care provider about genetic screening.  If you are at high risk for breast cancer, talk with your health care provider about having an MRI and a mammogram every year.  Breast cancer (BRCA) gene test is recommended for women who have family members with BRCA-related cancers. Results of the assessment will determine the need for genetic counseling and BRCA1 and for BRCA2 testing. BRCA-related cancers include these types:  Breast. This occurs in males or females.  Ovarian.  Tubal. This may also be called fallopian tube cancer.  Cancer of the abdominal or pelvic lining (peritoneal cancer).  Prostate.  Pancreatic. Cervical, Uterine, and Ovarian Cancer Your health care provider may recommend that you be screened regularly for cancer of the pelvic organs. These include your ovaries, uterus, and vagina. This screening involves a pelvic exam, which includes checking for microscopic changes to the surface of your cervix (Pap test).  For women ages 21-65, health care providers may recommend a pelvic exam and a Pap test every three years. For women ages 37-65, they may recommend the Pap test and pelvic exam, combined with testing for human papilloma virus (HPV), every five years. Some types of HPV increase your risk of cervical cancer. Testing for HPV may also be done on women of any age who have unclear Pap test results.  Other health care providers may not recommend any screening for nonpregnant women who are considered low risk for  pelvic cancer and have no symptoms. Ask your health care provider if a screening pelvic exam is right for you.  If you have had past treatment for cervical cancer or a condition that could lead to cancer, you need Pap tests and screening for cancer for at least 20 years after your treatment. If Pap tests have been discontinued for you, your risk factors (such as having a new sexual partner) need to be reassessed to determine if you should start having screenings again. Some women have medical problems that increase the chance of getting cervical cancer. In these cases, your health care provider may recommend that you have screening and Pap tests more often.  If you have a family history of uterine cancer or ovarian cancer, talk with your health care provider about genetic screening.  If you have vaginal bleeding after reaching menopause, tell your health care provider.  There are currently no reliable tests available to screen for ovarian cancer. Lung Cancer Lung cancer screening is recommended for adults 34-46 years old who are at high risk for lung cancer because of a history of smoking.  A yearly low-dose CT scan of the lungs is recommended if you:  Currently smoke.  Have a history of at least 30 pack-years of smoking and you currently smoke or have quit within the past 15 years. A pack-year is smoking an average of one pack of cigarettes per day for one year. Yearly screening should:  Continue until it has been 15 years since you quit.  Stop if you develop a health problem that would prevent you from having lung cancer treatment. Colorectal Cancer  This type of cancer can be detected and can often be prevented.  Routine colorectal cancer screening usually begins at age 65 and continues through age 75.  If you have risk factors for colon cancer, your health care provider may recommend that you be screened at an earlier age.  If you have a family history of colorectal cancer, talk with  your health care provider about genetic screening.  Your health care provider may also recommend using home test kits to check for hidden blood in your stool.  A small camera at the end of a tube can be used to examine your colon directly (sigmoidoscopy or colonoscopy). This is done to check for the earliest forms of colorectal cancer.  Direct examination of the colon should be repeated every 5-10 years until age 36. However, if early forms of precancerous polyps or small growths are found or if you have a family history or genetic risk for colorectal cancer, you may need to be screened more often. Skin Cancer  Check your skin from head to toe regularly.  Monitor any moles. Be sure to tell your health care provider:  About any new moles or changes in moles, especially if there is a change in a mole's shape or color.  If you have a mole that is larger than the size of a pencil eraser.  If any of your family members has a history of skin cancer, especially at a young age, talk with your health care provider about genetic screening.  Always use sunscreen. Apply sunscreen liberally and repeatedly throughout the day.  Whenever you are outside, protect yourself by wearing long sleeves, pants, a wide-brimmed hat, and sunglasses. WHAT SHOULD I KNOW ABOUT OSTEOPOROSIS? Osteoporosis is a condition in which bone destruction happens more quickly than new bone creation. After menopause, you may be at an increased risk for osteoporosis. To help prevent osteoporosis or the bone fractures that can happen because of osteoporosis, the following is recommended:  If you are 47-39 years old, get at least 1,000 mg of calcium and at least 600 mg of vitamin D per day.  If you are older than age 61 but younger than age 71, get at least 1,200 mg of calcium and at least 600 mg of vitamin D per day.  If you are older than age 76, get at least 1,200 mg of calcium and at least 800 mg of vitamin D per day. Smoking  and excessive alcohol intake increase the risk of osteoporosis. Eat foods that are rich in calcium and vitamin D, and do weight-bearing exercises several times each week as directed by your health care provider. WHAT SHOULD I KNOW ABOUT HOW MENOPAUSE AFFECTS [place]? Depression may occur at any age, but it is more common as you become older. Common symptoms of depression include:  Low or sad mood.  Changes in sleep patterns.  Changes in appetite or eating patterns.  Feeling an overall lack of motivation or enjoyment of activities that  you previously enjoyed.  Frequent crying spells. Talk with your health care provider if you think that you are experiencing depression. WHAT SHOULD I KNOW ABOUT IMMUNIZATIONS? It is important that you get and maintain your immunizations. These include:  Tetanus, diphtheria, and pertussis (Tdap) booster vaccine.  Influenza every year before the flu season begins.  Pneumonia vaccine.  Shingles vaccine. Your health care provider may also recommend other immunizations.   This information is not intended to replace advice given to you by your health care provider. Make sure you discuss any questions you have with your health care provider.   Document Released: 10/22/2005 Document Revised: 09/20/2014 Document Reviewed: 05/02/2014 Elsevier Interactive Patient Education [employer].

## 2016-01-31 DIAGNOSIS — F419 Anxiety disorder, unspecified: Secondary | ICD-10-CM | POA: Insufficient documentation

## 2016-01-31 DIAGNOSIS — F5105 Insomnia due to other mental disorder: Secondary | ICD-10-CM

## 2016-01-31 NOTE — Assessment & Plan Note (Signed)
She has undergone ACDF of C3-C5 one year after ACDF of C5 to C7,  Symptoms currently endorsing  Throat tightness,  Mild dysphagia.  Trial of prednisone taper.

## 2016-01-31 NOTE — Assessment & Plan Note (Signed)
Annual comprehensive preventive exam was done as well as an evaluation and management of chronic conditions .  During the course of the visit the patient was educated and counseled about appropriate screening and preventive services including :  diabetes screening, lipid analysis with projected  10 year  risk for CAD , nutrition counseling, breast, cervical and colorectal cancer screening, and recommended immunizations.  Printed recommendations for health maintenance screenings was given 

## 2016-01-31 NOTE — Assessment & Plan Note (Signed)
she has osteopenia,  Mild to moderate.   No change by 2 yr follow u p Nov 2016 DEXA  T Score -2.[name]. discussed trial of Evista,  No contraiindications

## 2016-01-31 NOTE — Assessment & Plan Note (Signed)
Early morning waking occurring 3-4 times per week. Contributors include constant worry about her adult son who is clinically depressed.  Discussed prn use of alprazolam,  The risks and benefits of benzodiazepine use were discussed with patient today including excessive sedation leading to respiratory depression,  impaired thinking/driving, and addiction.  Patient was advised to avoid concurrent use with alcohol, to use medication only as needed and not to share with others  .

## 2016-02-13 ENCOUNTER — Other Ambulatory Visit (INDEPENDENT_AMBULATORY_CARE_PROVIDER_SITE_OTHER): Payer: No Typology Code available for payment source

## 2016-02-13 DIAGNOSIS — R5382 Chronic fatigue, unspecified: Secondary | ICD-10-CM | POA: Diagnosis not present

## 2016-02-13 DIAGNOSIS — E034 Atrophy of thyroid (acquired): Secondary | ICD-10-CM

## 2016-02-13 DIAGNOSIS — E559 Vitamin D deficiency, unspecified: Secondary | ICD-10-CM

## 2016-02-13 LAB — COMPREHENSIVE METABOLIC PANEL
ALBUMIN: 4.3 g/dL (ref 3.6–5.1)
ALT: 17 U/L (ref 6–29)
AST: 20 U/L (ref 10–35)
Alkaline Phosphatase: 64 U/L (ref 33–130)
BUN: 18 mg/dL (ref 7–25)
CALCIUM: 9.8 mg/dL (ref 8.6–10.4)
CHLORIDE: 102 mmol/L (ref 98–110)
CO2: 29 mmol/L (ref 20–31)
CREATININE: 0.95 mg/dL (ref 0.50–1.05)
Glucose, Bld: 94 mg/dL (ref 65–99)
Potassium: 4.9 mmol/L (ref 3.5–5.3)
Sodium: 141 mmol/L (ref 135–146)
Total Bilirubin: 0.6 mg/dL (ref 0.2–1.2)
Total Protein: 6.5 g/dL (ref 6.1–8.1)

## 2016-02-13 LAB — CBC WITH DIFFERENTIAL/PLATELET
BASOS ABS: 63 {cells}/uL (ref 0–200)
Basophils Relative: 1 %
EOS PCT: 3 %
Eosinophils Absolute: 189 cells/uL (ref 15–500)
HCT: 41.8 % (ref 35.0–45.0)
Hemoglobin: 13.9 g/dL (ref 11.7–15.5)
Lymphocytes Relative: 23 %
Lymphs Abs: 1449 cells/uL (ref 850–3900)
MCH: 30.3 pg (ref 27.0–33.0)
MCHC: 33.3 g/dL (ref 32.0–36.0)
MCV: 91.3 fL (ref 80.0–100.0)
MONOS PCT: 8 %
MPV: 10 fL (ref 7.5–12.5)
Monocytes Absolute: 504 cells/uL (ref 200–950)
NEUTROS ABS: 4095 {cells}/uL (ref 1500–7800)
Neutrophils Relative %: 65 %
PLATELETS: 255 10*3/uL (ref 140–400)
RBC: 4.58 MIL/uL (ref 3.80–5.10)
RDW: 14 % (ref 11.0–15.0)
WBC: 6.3 10*3/uL (ref 3.8–10.8)

## 2016-02-13 LAB — LIPID PANEL
CHOL/HDL RATIO: 2 ratio (ref <=5.0)
CHOLESTEROL: 203 mg/dL — AB (ref 125–200)
HDL: 100 mg/dL (ref >=46)
LDL Cholesterol: 92 mg/dL (ref <130)
Triglycerides: 56 mg/dL (ref <150)
VLDL: 11 mg/dL (ref <30)

## 2016-02-13 LAB — TSH: TSH: 1.35 m[IU]/L

## 2016-02-14 LAB — VITAMIN D 25 HYDROXY (VIT D DEFICIENCY, FRACTURES): VIT D 25 HYDROXY: 44 ng/mL (ref 30–100)

## 2016-02-14 LAB — HIV ANTIBODY (ROUTINE TESTING W REFLEX): HIV 1&2 Ab, 4th Generation: NONREACTIVE

## 2016-02-15 ENCOUNTER — Encounter: Payer: Self-pay | Admitting: Internal Medicine

## 2016-02-16 ENCOUNTER — Other Ambulatory Visit: Payer: Self-pay

## 2016-02-16 MED ORDER — LEVOTHYROXINE SODIUM 88 MCG PO TABS: 88.0000 ug | ORAL_TABLET | Freq: Every day | ORAL | Status: DC

## 2016-04-26 ENCOUNTER — Ambulatory Visit (INDEPENDENT_AMBULATORY_CARE_PROVIDER_SITE_OTHER): Payer: No Typology Code available for payment source | Admitting: Internal Medicine

## 2016-04-26 DIAGNOSIS — W57XXXA Bitten or stung by nonvenomous insect and other nonvenomous arthropods, initial encounter: Secondary | ICD-10-CM

## 2016-04-26 DIAGNOSIS — S70361A Insect bite (nonvenomous), right thigh, initial encounter: Secondary | ICD-10-CM | POA: Diagnosis not present

## 2016-04-26 MED ORDER — CEPHALEXIN 500 MG PO CAPS: 500.0000 mg | ORAL_CAPSULE | Freq: Four times a day (QID) | ORAL | 0 refills | Status: DC

## 2016-04-26 MED ORDER — PREDNISONE 10 MG PO TABS: ORAL_TABLET | ORAL | 0 refills | Status: DC

## 2016-04-26 NOTE — Patient Instructions (Signed)
Prednisone taper and keflex for 7 days   Please take a probiotic ( Align, Floraque or Culturelle), the generic version of one of these over the counter medications, or a PROBIOTIC BEVERAGE  (kombucha,  KEVITA DRINKS AVAILABLE IN THE REFRIGERATED VEGETABLE SECTION OF ANY GROCERY STORE ) Yogurt, or another dietary source) for a minimum of 3 weeks to prevent a serious antibiotic associated diarrhea  Called clostridium dificile colitis.  Taking a probiotic may also prevent vaginitis due to yeast infections and can be continued indefinitely if you feel that it improves your digestion or your elimination (bowels).  Schiff makes a delicious probiotic for $20 (chocolate truffle style)  That [employer] carries  Use Zyrtec or allegra for the itching

## 2016-04-26 NOTE — Progress Notes (Signed)
Pre visit review using our clinic review tool, if applicable. No additional management support is needed unless otherwise documented below in the visit note. 

## 2016-04-26 NOTE — Progress Notes (Signed)
Subjective:  Patient ID: Theresa [name], female    DOB: 07/08/1956  Age: 60 y.o. MRN: [id number]  CC: The encounter diagnosis was Insect bite of thigh with local reaction, right, initial encounter.  HPI Theresa [name] presents for  Localized skin reaction to presumed Bug bite ON RIGHT LATERAL THIGH,  Occurred 24 hours ago.  Central area started itching and swelling and grew from the initial size of [name] quarter to the area of 3 b x 3 cm.  No fevers , no drainage,  Area is warm and red and itchy. Has been taking Benadryl and throat has been scratchy .   Outpatient Medications Prior to Visit  Medication Sig Dispense Refill  . ALPRAZolam (XANAX) 0.25 MG tablet Take 1 tablet (0.25 mg total) by mouth 2 (two) times daily as needed for anxiety. 30 tablet 5  . aspirin 81 MG tablet Take 81 mg by mouth daily.      . Calcium Carbonate-Vitamin D (CALCIUM 600+D) 600-400 MG-UNIT per tablet Take by mouth as directed.     [name] levothyroxine (SYNTHROID, LEVOTHROID) 88 MCG tablet Take 1 tablet (88 mcg total) by mouth daily. 90 tablet 3  . Multiple Vitamin (MULTIVITAMIN) tablet Take 1 tablet by mouth daily.      . raloxifene (EVISTA) 60 MG tablet Take 1 tablet (60 mg total) by mouth daily. 30 tablet 5  . Cholecalciferol (VITAMIN D3) 1000 UNITS CAPS Take by mouth daily.      . predniSONE (DELTASONE) 10 MG tablet 6 tablets on Day 1 , then reduce by 1 tablet daily until gone 21 tablet 0   No facility-administered medications prior to visit.     Review of Systems;  Patient denies headache, fevers, malaise, unintentional weight loss, skin rash, eye pain, sinus congestion and sinus pain, sore throat, dysphagia,  hemoptysis , cough, dyspnea, wheezing, chest pain, palpitations, orthopnea, edema, abdominal pain, nausea, melena, diarrhea, constipation, flank pain, dysuria, hematuria, urinary  Frequency, nocturia, numbness, tingling, seizures,  Focal weakness, Loss of consciousness,  Tremor, insomnia, depression, anxiety, and  suicidal ideation.      Objective:  BP (!) 126/58   Pulse 66   Temp 98.6 F (37 C)   Resp 12   Wt 138 lb (62.6 kg)   BMI 22.27 kg/m   BP Readings from Last 3 Encounters:  04/26/16 (!) 126/58  01/30/16 118/70  01/24/15 122/60    Wt Readings from Last 3 Encounters:  04/26/16 138 lb (62.6 kg)  01/30/16 133 lb (60.3 kg)  01/24/15 132 lb 8 oz (60.1 kg)    General appearance: alert, cooperative and appears stated age Ears: normal TM's and external ear canals both ears Throat: lips, mucosa, and tongue normal; teeth and gums normal Neck: no adenopathy, no carotid bruit, supple, symmetrical, trachea midline and thyroid not enlarged, symmetric, no tenderness/mass/nodules Skin: right thigh with sequelae of inset bite with surrounding cellulitis.  Lymph nodes: Cervical, supraclavicular, and axillary nodes normal.  No results found for: HGBA1C  Lab Results  Component Value Date   CREATININE 0.95 02/13/2016   CREATININE 0.79 01/17/2015   CREATININE 0.7 01/11/2014    Lab Results  Component Value Date   WBC 6.3 02/13/2016   HGB 13.9 02/13/2016   HCT 41.8 02/13/2016   PLT 255 02/13/2016   GLUCOSE 94 02/13/2016   CHOL 203 (H) 02/13/2016   TRIG 56 02/13/2016   HDL 100 02/13/2016   LDLDIRECT 129.4 01/02/2013   LDLCALC 92 02/13/2016   ALT 17 02/13/2016  AST 20 02/13/2016   NA 141 02/13/2016   K 4.9 02/13/2016   CL 102 02/13/2016   CREATININE 0.95 02/13/2016   BUN 18 02/13/2016   CO2 29 02/13/2016   TSH 1.35 02/13/2016    Dg Bone Density  Result Date: 08/04/2015 EXAM: DUAL X-RAY ABSORPTIOMETRY (DXA) FOR BONE MINERAL DENSITY IMPRESSION: Dear Dr. Duncan Dulleresa Theo Krumholz, Your patient Theresa Peonebecca [name] Etheredge completed [name] FRAX assessment on 08/04/2015 using the Lunar iDXA DXA System (analysis version: 14.10) manufactured by [employer]. The following summarizes the results of our evaluation. PATIENT BIOGRAPHICAL: Name: Theresa [name], Theresa [name] Patient ID: [id number] Birth Date: 03-30-1956 Height:     66.0 in. Gender:     [age]    [age]       Weight:    138.5 lbs. Ethnicity:  White                            Exam Date: 08/04/2015 FRAX* RESULTS:  (version: 3.5) 10-year Probability of Fracture1 Major Osteoporotic Fracture2 Hip Fracture 16.8% 1.5% Population: [place] (Caucasian) Risk Factors: Family Hist. (Parent hip fracture) Based on Femur (Right) Neck BMD 1 -The 10-year probability of fracture may be lower than reported if the patient has received treatment. 2 -Major Osteoporotic Fracture: Clinical Spine, Forearm, Hip or Shoulder *FRAX is [name] [employer]support[employer]administrative [employer]rad[employer] of the [employer] [facility] Metabolic Bone Disease, [name] World Science writerHealth Organization (WHO) [employer]. ASSESSMENT: The probability of [name] major osteoporotic fracture is 16.8% within the next ten years. The probability of [name] hip fracture is 1.5% within the next ten years. . Dear Dr. Duncan Dulleresa Cashe Gatt, Your patient Theresa [name] completed [name] BMD test on 08/04/2015 using the Lunar iDXA DXA System (analysis version: 14.10) manufactured by [employer]. The following summarizes the results of our evaluation. PATIENT BIOGRAPHICAL: Name: Theresa [name], Theresa [name] Patient ID: [id number] Birth Date: 03-30-1956 Height: 66.0 in. Gender: Female Exam Date: 08/04/2015 Weight: 138.5 lbs. Indications: Caucasian, Family Hx of Osteoporosis, Height Loss, Hypothyroid, Hysterectomy, Parent Hip Fracture, Postmenopausal, Family Hist. (Parent hip fracture) Fractures: Treatments: CALCIUM VIT D, LEVOTHYROXINE ASSESSMENT: The BMD measured at Femur Neck Right is 0.728 g/cm2 with [name] T-score of -2.2. This patient is considered osteopenic according to World Health Organization [facility](WHO) criteria. Site Region Measured Measured WHO Young Adult BMD Date       Age      Classification T-score AP Spine L1-L4 08/04/2015 57.3 Osteopenia -1.6 1.000 g/cm2 AP Spine L1-L4 08/02/2013 55.3 Osteopenia -1.6 0.997 g/cm2 AP Spine L1-L4 10/07/2010 52.5 Osteopenia -1.1 1.064 g/cm2 AP Spine L1-L4  09/19/2008 50.4 Normal -1.0 1.072 g/cm2 DualFemur Neck Right 08/04/2015 57.3 Osteopenia -2.2 0.728 g/cm2 DualFemur Neck Right 08/02/2013 55.3 Osteopenia -2.1 0.745 g/cm2 DualFemur Neck Right 10/07/2010 52.5 Osteopenia -1.5 0.833 g/cm2 DualFemur Neck Right 09/19/2008 50.4 Osteopenia -1.3 0.852 g/cm2 World Health Organization [facility](WHO) criteria for post-menopausal, Caucasian Women: Normal:       T-score at or above -1 SD Osteopenia:   T-score between -1 and -2.5 SD Osteoporosis: T-score at or below -2.5 SD RECOMMENDATIONS: National Osteoporosis Foundation recommends that FDA-approved medical therapies be considered in postmenopausal women and men age 58 or older with [name]: 1. Hip or vertebral (clinical or morphometric) fracture. 2. T-score of < -2.5 at the spine or hip. 3. Ten-year fracture probability by FRAX of 3% or greater for hip fracture or 20% or greater for major osteoporotic fracture. All treatment decisions require clinical judgment and  consideration of individual patient factors, including patient preferences, co-morbidities, previous drug use, risk factors not captured in the FRAX model (e.g. falls, vitamin D deficiency, increased bone turnover, interval significant decline in bone density) and possible under - or over-estimation of fracture risk by FRAX. All patients should ensure an adequate intake of dietary calcium (1200 mg/d) and vitamin D (800 IU daily) unless contraindicated. FOLLOW-UP: People with diagnosed cases of osteoporosis or at high risk for fracture should have regular bone mineral density tests. For patients eligible for Medicare, routine testing is allowed once every 2 years. The testing frequency can be increased to one year for patients who have rapidly progressing disease, those who are receiving or discontinuing medical therapy to restore bone mass, or have additional risk factors. I have reviewed this report, and agree with the above findings. [facility] Electronically Signed    By: Bretta BangWilliam  Woodruff III M.D.   On: 08/04/2015 14:08    Assessment & Plan:   Problem List Items Addressed This Visit    Insect bite of thigh with local reaction    Will treat with keflex  And prednisone for surrounding  Erythema        Other Visit Diagnoses   None.     I have discontinued Ms. Pavey's Vitamin D3 and predniSONE. I am also having her start on cephALEXin and predniSONE. Additionally, I am having her maintain her Calcium Carbonate-Vitamin D, multivitamin, aspirin, ALPRAZolam, raloxifene, and levothyroxine.  Meds ordered this encounter  Medications  . cephALEXin (KEFLEX) 500 MG capsule    Sig: Take 1 capsule (500 mg total) by mouth 4 (four) times daily.    Dispense:  28 capsule    Refill:  0  . predniSONE (DELTASONE) 10 MG tablet    Sig: 6 tablets on Day 1 , then reduce by 1 tablet daily until gone    Dispense:  21 tablet    Refill:  0    Medications Discontinued During This Encounter  Medication Reason  . predniSONE (DELTASONE) 10 MG tablet Completed Course  . Cholecalciferol (VITAMIN D3) 1000 UNITS CAPS Duplicate    Follow-up: No Follow-up on file.   Sherlene ShamsULLO, Jackelin Correia L, MD

## 2016-04-27 DIAGNOSIS — W57XXXA Bitten or stung by nonvenomous insect and other nonvenomous arthropods, initial encounter: Secondary | ICD-10-CM | POA: Insufficient documentation

## 2016-04-27 DIAGNOSIS — S70369A Insect bite (nonvenomous), unspecified thigh, initial encounter: Secondary | ICD-10-CM | POA: Insufficient documentation

## 2016-04-27 NOTE — Assessment & Plan Note (Signed)
Will treat with keflex  And prednisone for surrounding  Erythema

## 2016-05-07 ENCOUNTER — Other Ambulatory Visit: Payer: Self-pay | Admitting: Family

## 2016-05-08 LAB — HEPATITIS B SURFACE ANTIBODY,QUALITATIVE: HEP B SURFACE AB, QUAL: REACTIVE

## 2016-06-10 ENCOUNTER — Other Ambulatory Visit: Payer: Self-pay | Admitting: Internal Medicine

## 2016-06-10 DIAGNOSIS — Z1231 Encounter for screening mammogram for malignant neoplasm of breast: Secondary | ICD-10-CM

## 2016-07-16 ENCOUNTER — Encounter: Payer: Self-pay | Admitting: Internal Medicine

## 2016-07-23 ENCOUNTER — Ambulatory Visit
Admission: RE | Admit: 2016-07-23 | Discharge: 2016-07-23 | Disposition: A | Discharge status: Home / Self Care | Payer: No Typology Code available for payment source | Source: Ambulatory Visit | Attending: Internal Medicine | Admitting: Internal Medicine

## 2016-07-23 DIAGNOSIS — Z1231 Encounter for screening mammogram for malignant neoplasm of breast: Secondary | ICD-10-CM | POA: Diagnosis not present

## 2016-08-24 ENCOUNTER — Other Ambulatory Visit: Payer: Self-pay | Admitting: Internal Medicine

## 2016-08-24 NOTE — Telephone Encounter (Signed)
Last filled 05/28/16. Last OV where Xanax discussed was 01/30/16. Has no scheduled follow up.

## 2016-08-24 NOTE — Telephone Encounter (Signed)
Needs OV every 6 month for alprazolam refill.s last OV august , so she needs a feb appt please schedule

## 2016-10-05 DIAGNOSIS — M549 Dorsalgia, unspecified: Secondary | ICD-10-CM | POA: Insufficient documentation

## 2016-10-22 DIAGNOSIS — M25559 Pain in unspecified hip: Secondary | ICD-10-CM | POA: Insufficient documentation

## 2016-10-22 DIAGNOSIS — M5440 Lumbago with sciatica, unspecified side: Secondary | ICD-10-CM | POA: Insufficient documentation

## 2016-10-22 DIAGNOSIS — M47816 Spondylosis without myelopathy or radiculopathy, lumbar region: Secondary | ICD-10-CM | POA: Insufficient documentation

## 2016-10-22 DIAGNOSIS — M5416 Radiculopathy, lumbar region: Secondary | ICD-10-CM | POA: Insufficient documentation

## 2016-10-22 DIAGNOSIS — M5136 Other intervertebral disc degeneration, lumbar region: Secondary | ICD-10-CM | POA: Insufficient documentation

## 2016-10-28 DIAGNOSIS — M5126 Other intervertebral disc displacement, lumbar region: Secondary | ICD-10-CM | POA: Insufficient documentation

## 2016-12-14 ENCOUNTER — Other Ambulatory Visit: Payer: Self-pay | Admitting: Internal Medicine

## 2016-12-14 DIAGNOSIS — E034 Atrophy of thyroid (acquired): Secondary | ICD-10-CM

## 2016-12-14 DIAGNOSIS — Z13220 Encounter for screening for lipoid disorders: Secondary | ICD-10-CM

## 2016-12-14 DIAGNOSIS — Z0000 Encounter for general adult medical examination without abnormal findings: Secondary | ICD-10-CM

## 2016-12-14 NOTE — Telephone Encounter (Signed)
Refilled: 08/24/2016 Last OV: 01/30/2016 Next OV: not scheduled

## 2016-12-14 NOTE — Telephone Encounter (Signed)
Refill denied.  6 month follow up required for refills on all benzodiazepines

## 2016-12-15 NOTE — Telephone Encounter (Signed)
Scheduled pt both an office visit for a CPE and a fasting lab appt for the week before. Ordered a CMP, Vitamin D, TSH, CBC, and a lipid panel. Is there anything else that needs to be ordered?

## 2016-12-19 MED ORDER — ALPRAZOLAM 0.25 MG PO TABS: 0.2500 mg | ORAL_TABLET | Freq: Two times a day (BID) | ORAL | 0 refills | Status: DC | PRN

## 2016-12-19 NOTE — Telephone Encounter (Signed)
Perfect.  Thank you. Alprazolam refilled for 30 days,  Please print

## 2016-12-20 MED ORDER — ALPRAZOLAM 0.25 MG PO TABS: 0.2500 mg | ORAL_TABLET | Freq: Two times a day (BID) | ORAL | 0 refills | Status: DC | PRN

## 2016-12-20 NOTE — Telephone Encounter (Signed)
Signed and faxed

## 2016-12-20 NOTE — Telephone Encounter (Signed)
Printed and placed in quick sign folder 

## 2017-01-21 ENCOUNTER — Encounter: Payer: Self-pay | Admitting: Internal Medicine

## 2017-01-28 ENCOUNTER — Other Ambulatory Visit (INDEPENDENT_AMBULATORY_CARE_PROVIDER_SITE_OTHER): Payer: No Typology Code available for payment source

## 2017-01-28 DIAGNOSIS — Z0000 Encounter for general adult medical examination without abnormal findings: Secondary | ICD-10-CM

## 2017-01-28 DIAGNOSIS — Z13220 Encounter for screening for lipoid disorders: Secondary | ICD-10-CM

## 2017-01-28 DIAGNOSIS — E034 Atrophy of thyroid (acquired): Secondary | ICD-10-CM

## 2017-01-28 LAB — CBC WITH DIFFERENTIAL/PLATELET
BASOS ABS: 64 {cells}/uL (ref 0–200)
Basophils Relative: 1 %
EOS ABS: 320 {cells}/uL (ref 15–500)
Eosinophils Relative: 5 %
HEMATOCRIT: 39.1 % (ref 35.0–45.0)
Hemoglobin: 13.1 g/dL (ref 11.7–15.5)
Lymphocytes Relative: 19 %
Lymphs Abs: 1216 cells/uL (ref 850–3900)
MCH: 31.6 pg (ref 27.0–33.0)
MCHC: 33.5 g/dL (ref 32.0–36.0)
MCV: 94.2 fL (ref 80.0–100.0)
MONO ABS: 704 {cells}/uL (ref 200–950)
MONOS PCT: 11 %
MPV: 10 fL (ref 7.5–12.5)
NEUTROS ABS: 4096 {cells}/uL (ref 1500–7800)
Neutrophils Relative %: 64 %
PLATELETS: 257 10*3/uL (ref 140–400)
RBC: 4.15 MIL/uL (ref 3.80–5.10)
RDW: 14.2 % (ref 11.0–15.0)
WBC: 6.4 10*3/uL (ref 3.8–10.8)

## 2017-01-28 LAB — COMPREHENSIVE METABOLIC PANEL
ALT: 14 U/L (ref 6–29)
AST: 19 U/L (ref 10–35)
Albumin: 4.3 g/dL (ref 3.6–5.1)
Alkaline Phosphatase: 56 U/L (ref 33–130)
BILIRUBIN TOTAL: 0.5 mg/dL (ref 0.2–1.2)
BUN: 10 mg/dL (ref 7–25)
CALCIUM: 8.8 mg/dL (ref 8.6–10.4)
CO2: 30 mmol/L (ref 20–31)
CREATININE: 0.82 mg/dL (ref 0.50–1.05)
Chloride: 104 mmol/L (ref 98–110)
GLUCOSE: 86 mg/dL (ref 65–99)
Potassium: 4.7 mmol/L (ref 3.5–5.3)
SODIUM: 141 mmol/L (ref 135–146)
Total Protein: 6.2 g/dL (ref 6.1–8.1)

## 2017-01-28 LAB — LIPID PANEL
CHOL/HDL RATIO: 2.1 ratio (ref <5.0)
Cholesterol: 182 mg/dL (ref <200)
HDL: 87 mg/dL (ref >50)
LDL Cholesterol: 83 mg/dL (ref <100)
Triglycerides: 60 mg/dL (ref <150)
VLDL: 12 mg/dL (ref <30)

## 2017-01-28 NOTE — Addendum Note (Signed)
Addended by: Warden FillersWRIGHT, Keisa Blow S on: 01/28/2017 08:59 AM   Modules accepted: Orders

## 2017-01-29 LAB — VITAMIN D 25 HYDROXY (VIT D DEFICIENCY, FRACTURES): Vit D, 25-Hydroxy: 47 ng/mL (ref 30–100)

## 2017-01-29 LAB — TSH: TSH: 1.72 m[IU]/L

## 2017-01-31 ENCOUNTER — Encounter: Payer: Self-pay | Admitting: Internal Medicine

## 2017-02-04 ENCOUNTER — Ambulatory Visit (INDEPENDENT_AMBULATORY_CARE_PROVIDER_SITE_OTHER): Payer: No Typology Code available for payment source | Admitting: Internal Medicine

## 2017-02-04 ENCOUNTER — Encounter: Payer: Self-pay | Admitting: Internal Medicine

## 2017-02-04 VITALS — BP 138/74 | HR 69 | Temp 98.0°F | Resp 16 | Ht 66.0 in | Wt 136.6 lb

## 2017-02-04 DIAGNOSIS — M85859 Other specified disorders of bone density and structure, unspecified thigh: Secondary | ICD-10-CM | POA: Diagnosis not present

## 2017-02-04 DIAGNOSIS — E010 Iodine-deficiency related diffuse (endemic) goiter: Secondary | ICD-10-CM | POA: Diagnosis not present

## 2017-02-04 DIAGNOSIS — E034 Atrophy of thyroid (acquired): Secondary | ICD-10-CM

## 2017-02-04 DIAGNOSIS — Z0000 Encounter for general adult medical examination without abnormal findings: Secondary | ICD-10-CM

## 2017-02-04 DIAGNOSIS — I38 Endocarditis, valve unspecified: Secondary | ICD-10-CM | POA: Diagnosis not present

## 2017-02-04 MED ORDER — ALPRAZOLAM 0.25 MG PO TABS: 0.2500 mg | ORAL_TABLET | Freq: Two times a day (BID) | ORAL | 3 refills | Status: DC | PRN

## 2017-02-04 NOTE — Progress Notes (Signed)
Patient ID: Theresa [name], female    DOB: 1956/08/01  Age: 61 y.o. MRN: [id number]  The patient is here for annual  Physical examination and management of other chronic and acute problems.  Health Maintenance PAP normal 2014, [facility] Colonoscopy 2009 Mammogram nNov M nOV 201    The risk factors are reflected in the social history.  The roster of all physicians providing medical care to patient - is listed in the Snapshot section of the chart.  Activities of daily living:  The patient is 100% independent in all ADLs: dressing, toileting, feeding as well as independent mobility  Home safety : The patient has smoke detectors in the home. They wear seatbelts.  There are no firearms at home. There is no violence in the home.   There is no risks for hepatitis, STDs or HIV. There is no   history of blood transfusion. They have no travel history to infectious disease endemic areas of the world.  The patient has seen their dentist in the last six month. They have seen their eye doctor in the last year.  They do not  have excessive sun exposure. Discussed the need for sun protection: hats, long sleeves and use of sunscreen if there is significant sun exposure.   Diet: the importance of a healthy diet is discussed. They do have a healthy diet.  The benefits of regular aerobic exercise were discussed. Theresa [name] walks 4 times per week ,  20 minutes.   Depression screen: there are no signs or vegative symptoms of depression- irritability, change in appetite, anhedonia, sadness/tearfullness.   The following portions of the patient's history were reviewed and updated as appropriate: allergies, current medications, past family history, past medical history,  past surgical history, past social history  and problem list.  Visual acuity was not assessed per patient preference since Theresa [name] has regular follow up with her ophthalmologist. Hearing and body mass index were assessed and reviewed.   During the  course of the visit the patient was educated and counseled about appropriate screening and preventive services including : fall prevention , diabetes screening, nutrition counseling, colorectal cancer screening, and recommended immunizations.    CC: The primary encounter diagnosis was Thyromegaly. Diagnoses of Murmur, diastolic, Osteopenia of neck of femur, unspecified laterality, Visit for preventive health examination, Hypothyroidism due to acquired atrophy of thyroid, and Diastolic murmur were also pertinent to this visit.  Labs reviewed; all normal   Having back surgery by Nudelman at end of year  Fusion of L3 AND l4,  HAD NO RELIEF WITH ESI IN February   Started back on Evista after sister broke 8 bones.  Hot flashes not bad. Only off of it for a month.      [name] has a past medical history of Breast screening, unspecified; H/O: Bell's palsy; Hypercalcemia; Osteoporosis; Screening for malignant neoplasm of the cervix; Special screening for malignant neoplasms, colon; Unspecified hypothyroidism; and Unspecified otitis media.   Theresa [name] has a past surgical history that includes Partial hysterectomy; Meniscus repair (May 2011); Cholecystectomy (1989); dental implant; Anterior fusion cervical spine (jan 2014); Spine surgery; and Anterior cervical decomp/discectomy fusion (Dec 2014).   Her family history includes Alzheimer's disease in her mother; Breast cancer (age of onset: 56) in her sister; Cancer (age of onset: 17) in her paternal aunt and sister; Hyperlipidemia in her father; Thyroid nodules in her maternal grandmother, mother, and sister.Theresa [name] reports that Theresa [name] has never smoked. Theresa [name] has never used smokeless tobacco. Theresa [name] reports that  Theresa [name] drinks about 2.5 oz of alcohol per week . Her drug history is not on file.  Outpatient Medications Prior to Visit  Medication Sig Dispense Refill  . aspirin 81 MG tablet Take 81 mg by mouth daily.      [name] levothyroxine (SYNTHROID, LEVOTHROID) 88 MCG  tablet Take 1 tablet (88 mcg total) by mouth daily. 90 tablet 3  . Multiple Vitamin (MULTIVITAMIN) tablet Take 1 tablet by mouth daily.      . raloxifene (EVISTA) 60 MG tablet Take 1 tablet (60 mg total) by mouth daily. 30 tablet 5  . ALPRAZolam (XANAX) 0.25 MG tablet Take 1 tablet (0.25 mg total) by mouth 2 (two) times daily as needed. for anxiety 30 tablet 0  . Calcium Carbonate-Vitamin D (CALCIUM 600+D) 600-400 MG-UNIT per tablet Take by mouth as directed.     . cephALEXin (KEFLEX) 500 MG capsule Take 1 capsule (500 mg total) by mouth 4 (four) times daily. (Patient not taking: Reported on 02/04/2017) 28 capsule 0  . predniSONE (DELTASONE) 10 MG tablet 6 tablets on Day 1 , then reduce by 1 tablet daily until gone (Patient not taking: Reported on 02/04/2017) 21 tablet 0   No facility-administered medications prior to visit.     Review of Systems   Patient denies headache, fevers, malaise, unintentional weight loss, skin rash, eye pain, sinus congestion and sinus pain, sore throat, dysphagia,  hemoptysis , cough, dyspnea, wheezing, chest pain, palpitations, orthopnea, edema, abdominal pain, nausea, melena, diarrhea, constipation, flank pain, dysuria, hematuria, urinary  Frequency, nocturia, numbness, tingling, seizures,  Focal weakness, Loss of consciousness,  Tremor, insomnia, depression, anxiety, and suicidal ideation.      Objective:  BP 138/74 (BP Location: Left Arm, Patient Position: Sitting, Cuff Size: Normal)   Pulse 69   Temp 98 F (36.7 C) (Oral)   Resp 16   Ht 5\' 6"  (1.676 m)   Wt 136 lb 9.6 oz (62 kg)   SpO2 98%   BMI 22.05 kg/m   Physical Exam   General appearance: alert, cooperative and appears stated age Head: Normocephalic, without obvious abnormality, atraumatic Eyes: conjunctivae/corneas clear. PERRL, EOM's intact. Fundi benign. Ears: normal TM's and external ear canals both ears Nose: Nares normal. Septum midline. Mucosa normal. No drainage or sinus  tenderness. Throat: lips, mucosa, and tongue normal; teeth and gums normal Neck: no adenopathy, no carotid bruit, no JVD, supple, symmetrical, trachea midline and thyroid not enlarged, symmetric, no tenderness/mass/nodules Lungs: clear to auscultation bilaterally Breasts: normal appearance, no masses or tenderness Heart: regular rate and rhythm, S1, S2 normal, no murmur, click, rub or gallop Abdomen: soft, non-tender; bowel sounds normal; no masses,  no organomegaly Extremities: extremities normal, atraumatic, no cyanosis or edema Pulses: 2+ and symmetric Skin: Skin color, texture, turgor normal. No rashes or lesions Neurologic: Alert and oriented X 3, normal strength and tone. Normal symmetric reflexes. Normal coordination and gait.      Assessment & Plan:   Problem List Items Addressed This Visit    Visit for preventive health examination    Annual comprehensive preventive exam was done as well as an evaluation and management of chronic conditions .  During the course of the visit the patient was educated and counseled about appropriate screening and preventive services including :  diabetes screening, lipid analysis with projected  10 year  risk for CAD , nutrition counseling, breast, cervical and colorectal cancer screening, and recommended immunizations.  Printed recommendations for health maintenance screenings was given  Thyromegaly - Primary    Theresa [name] has an enlarged right thyroid lobe by previous ultrasound andis due fore repeat evaluation       Relevant Orders   US Soft Tissue Head/Neck   Osteopenia    Progression by most recent DEXA with T score -2.2 and risk of fracture 17%.  Theresa [name] has resumed Evista.  Discussed the current controversies surrounding the risks and benefits of calcium supplementation.  Encouraged her to increase dietary calcium through natural foods including almond/coconut milk.  Advised to suspend Evista one month prior to lumbar surgery to minimize risk of  perioperative blood clot       Hypothyroidism    Thyroid function is WNL on current dose.  No current changes needed.   Lab Results  Component Value Date   TSH 1.72 01/28/2017         Diastolic murmur    Hear on exam today.  ECHO ordered.        Other Visit Diagnoses    Murmur, diastolic       Relevant Orders   ECHOCARDIOGRAM COMPLETE      I have discontinued Ms. Latona's Calcium Carbonate-Vitamin D, cephALEXin, and predniSONE. I am also having her maintain her multivitamin, aspirin, raloxifene, levothyroxine, Biotin, Vitamin D3, and ALPRAZolam.  Meds ordered this encounter  Medications  . Biotin 1000 MCG CHEW    Sig: Chew by mouth.  . Cholecalciferol (VITAMIN D3) 1000 units CAPS    Sig: Take by mouth daily.  [name]. ALPRAZolam (XANAX) 0.25 MG tablet    Sig: Take 1 tablet (0.25 mg total) by mouth 2 (two) times daily as needed. for anxiety    Dispense:  30 tablet    Refill:  3    Medications Discontinued During This Encounter  Medication Reason  . Calcium Carbonate-Vitamin D (CALCIUM 600+D) 600-400 MG-UNIT per tablet Patient has not taken in last 30 days  . cephALEXin (KEFLEX) 500 MG capsule Therapy completed  . predniSONE (DELTASONE) 10 MG tablet Therapy completed  . ALPRAZolam (XANAX) 0.25 MG tablet Reorder    Follow-up: No Follow-up on file.   Sherlene ShamsULLO, TERESA L, MD

## 2017-02-04 NOTE — Patient Instructions (Addendum)
Ultrasound of thyroid has been ordered  ECHO ordered to evaluate your  Murmur   Continue 1000 IUS of D3 until October,  Then incraese to 2000 Ius  Daily   We wil check labs again the month of your surgery .  We will repeat DEXA IN  November.    SUSPEND Evista one month before your surgery to minimize risk of blood clot.  I recommend getting the majority of your calcium and Vitamin D  through diet rather than supplements given the recent association of calcium supplements with increased coronary artery calcium scores  Try the almond and cashew milks that most grocery stores  now carry  in the dairy  Section>   They are lactose free:  Silk brand Almond Light,  Original formula.  Delicious,  Low carb,  Low cal,  Cholesterol free   Here's a high calcium delicious smoothie recipe I use to get my calcium in :  Dannon  Lt Fit greek yougurt Almond milk premier protien  vanilla shake frozen dark cherries almond extract (spash) Nutmeg (tiny amount!)

## 2017-02-05 ENCOUNTER — Other Ambulatory Visit: Payer: Self-pay | Admitting: Internal Medicine

## 2017-02-06 DIAGNOSIS — I38 Endocarditis, valve unspecified: Secondary | ICD-10-CM | POA: Insufficient documentation

## 2017-02-06 NOTE — Assessment & Plan Note (Signed)
Progression by most recent DEXA with T score -2.2 and risk of fracture 17%.  She has resumed Evista.  Discussed the current controversies surrounding the risks and benefits of calcium supplementation.  Encouraged her to increase dietary calcium through natural foods including almond/coconut milk.  Advised to suspend Evista one month prior to lumbar surgery to minimize risk of perioperative blood clot

## 2017-02-06 NOTE — Assessment & Plan Note (Signed)
Annual comprehensive preventive exam was done as well as an evaluation and management of chronic conditions .  During the course of the visit the patient was educated and counseled about appropriate screening and preventive services including :  diabetes screening, lipid analysis with projected  10 year  risk for CAD , nutrition counseling, breast, cervical and colorectal cancer screening, and recommended immunizations.  Printed recommendations for health maintenance screenings was given 

## 2017-02-06 NOTE — Assessment & Plan Note (Signed)
She has an enlarged right thyroid lobe by previous ultrasound andis due fore repeat evaluation

## 2017-02-06 NOTE — Assessment & Plan Note (Signed)
Thyroid function is WNL on current dose.  No current changes needed.   Lab Results  Component Value Date   TSH 1.72 01/28/2017

## 2017-02-06 NOTE — Assessment & Plan Note (Signed)
Hear on exam today.  ECHO ordered.

## 2017-02-11 ENCOUNTER — Ambulatory Visit
Admission: RE | Admit: 2017-02-11 | Discharge: 2017-02-11 | Disposition: A | Discharge status: Home / Self Care | Payer: No Typology Code available for payment source | Source: Ambulatory Visit | Attending: Internal Medicine | Admitting: Internal Medicine

## 2017-02-11 DIAGNOSIS — E034 Atrophy of thyroid (acquired): Secondary | ICD-10-CM | POA: Insufficient documentation

## 2017-02-11 DIAGNOSIS — E010 Iodine-deficiency related diffuse (endemic) goiter: Secondary | ICD-10-CM

## 2017-02-11 DIAGNOSIS — E041 Nontoxic single thyroid nodule: Secondary | ICD-10-CM | POA: Insufficient documentation

## 2017-02-13 ENCOUNTER — Encounter: Payer: Self-pay | Admitting: Internal Medicine

## 2017-03-11 ENCOUNTER — Other Ambulatory Visit: Payer: Self-pay

## 2017-03-11 ENCOUNTER — Ambulatory Visit (INDEPENDENT_AMBULATORY_CARE_PROVIDER_SITE_OTHER): Payer: No Typology Code available for payment source

## 2017-03-11 DIAGNOSIS — I38 Endocarditis, valve unspecified: Secondary | ICD-10-CM

## 2017-03-11 DIAGNOSIS — Q248 Other specified congenital malformations of heart: Secondary | ICD-10-CM

## 2017-03-15 ENCOUNTER — Encounter: Payer: Self-pay | Admitting: Internal Medicine

## 2017-03-15 DIAGNOSIS — Q248 Other specified congenital malformations of heart: Secondary | ICD-10-CM | POA: Insufficient documentation

## 2017-03-15 NOTE — Assessment & Plan Note (Signed)
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   I recommended  follow up with one of our cardiologists to have annual surveillance of your valve and answer any questions she may have. .  I will make the referral.

## 2017-03-22 ENCOUNTER — Encounter: Payer: Self-pay | Admitting: Internal Medicine

## 2017-03-23 ENCOUNTER — Telehealth: Payer: Self-pay | Admitting: *Deleted

## 2017-03-23 ENCOUNTER — Other Ambulatory Visit: Payer: Self-pay | Admitting: Internal Medicine

## 2017-03-23 DIAGNOSIS — K921 Melena: Secondary | ICD-10-CM

## 2017-03-23 NOTE — Telephone Encounter (Signed)
Patient scheduled.

## 2017-03-23 NOTE — Telephone Encounter (Signed)
Spoke with pt to get a little more information and when asked about the spotting she stated that she told the person she spoke with that she was bleeding while having a bowel movement. The pt also stated that she has already spoken with Dr. Darrick Huntsmanullo via email and Dr. Darrick Huntsmanullo told her to schedule an appt. Pt would like to be scheduled for Tuesday July 17th at 4:30pm.

## 2017-03-23 NOTE — Telephone Encounter (Signed)
Patient requested to see Dr. Darrick Huntsmanullo only for spotting. Please give a time and date to place pt  Pt contact [phone] or [phone]

## 2017-03-25 ENCOUNTER — Encounter: Payer: Self-pay | Admitting: Internal Medicine

## 2017-03-29 ENCOUNTER — Ambulatory Visit (INDEPENDENT_AMBULATORY_CARE_PROVIDER_SITE_OTHER): Payer: No Typology Code available for payment source

## 2017-03-29 ENCOUNTER — Encounter: Payer: Self-pay | Admitting: Internal Medicine

## 2017-03-29 ENCOUNTER — Ambulatory Visit (INDEPENDENT_AMBULATORY_CARE_PROVIDER_SITE_OTHER): Payer: No Typology Code available for payment source | Admitting: Internal Medicine

## 2017-03-29 VITALS — BP 124/66 | HR 60 | Temp 98.4°F | Resp 15 | Ht 66.0 in | Wt 140.0 lb

## 2017-03-29 DIAGNOSIS — R197 Diarrhea, unspecified: Secondary | ICD-10-CM

## 2017-03-29 DIAGNOSIS — R1032 Left lower quadrant pain: Secondary | ICD-10-CM | POA: Diagnosis not present

## 2017-03-29 DIAGNOSIS — K921 Melena: Secondary | ICD-10-CM | POA: Diagnosis not present

## 2017-03-29 NOTE — Progress Notes (Signed)
Subjective:  Patient ID: Theresa [name], female    DOB: 16-Feb-1956  Age: 61 y.o. MRN: [id number]  CC: The primary encounter diagnosis was Abdominal pain, LLQ. Diagnoses of Diarrhea of presumed infectious origin and Hematochezia were also pertinent to this visit.  HPI Theresa PeonRebecca A Laffoon presents for evaluation of blood in stools.  First episode occurred on July 8  Stool was very loose,  Had explosive diarrhea for a full day and started with first episode,  Was ccompanied by  abdominal cramping, fever of 102, and body aches.  No nausea or vomiting.  Stools have been occurring every few hours .and have been liquid in consistency without variation. She denies any recent travel to endemic areas,  No recent camping,  No household members affected. .  No unintentional weight loss.      Prior to July 8 stools were formed and normal caliber and color. .    Last colonscopy normal Sept 2009    Outpatient Medications Prior to Visit  Medication Sig Dispense Refill  . ALPRAZolam (XANAX) 0.25 MG tablet Take 1 tablet (0.25 mg total) by mouth 2 (two) times daily as needed. for anxiety 30 tablet 3  . Biotin 1000 MCG CHEW Chew by mouth.    . Cholecalciferol (VITAMIN D3) 1000 units CAPS Take by mouth daily.    [name]. levothyroxine (SYNTHROID, LEVOTHROID) 88 MCG tablet TAKE ONE TABLET BY MOUTH ONCE DAILY 90 tablet 3  . Multiple Vitamin (MULTIVITAMIN) tablet Take 1 tablet by mouth daily.      . raloxifene (EVISTA) 60 MG tablet TAKE ONE TABLET BY MOUTH ONCE DAILY (Patient not taking: Reported on 03/29/2017) 30 tablet 5  . aspirin 81 MG tablet Take 81 mg by mouth daily.       No facility-administered medications prior to visit.     Review of Systems;  Patient denies headache, fevers, malaise, unintentional weight loss, skin rash, eye pain, sinus congestion and sinus pain, sore throat, dysphagia,  hemoptysis , cough, dyspnea, wheezing, chest pain, palpitations, orthopnea, edema, abdominal pain, nausea, melena, diarrhea,  constipation, flank pain, dysuria, hematuria, urinary  Frequency, nocturia, numbness, tingling, seizures,  Focal weakness, Loss of consciousness,  Tremor, insomnia, depression, anxiety, and suicidal ideation.      Objective:  BP 124/66 (BP Location: Left Arm, Patient Position: Sitting, Cuff Size: Normal)   Pulse 60   Temp 98.4 F (36.9 C) (Oral)   Resp 15   Ht 5\' 6"  (1.676 m)   Wt 140 lb (63.5 kg)   SpO2 98%   BMI 22.60 kg/m   BP Readings from Last 3 Encounters:  03/29/17 124/66  02/04/17 138/74  04/26/16 (!) 126/58    Wt Readings from Last 3 Encounters:  03/29/17 140 lb (63.5 kg)  02/04/17 136 lb 9.6 oz (62 kg)  04/26/16 138 lb (62.6 kg)    General appearance: alert, cooperative and appears stated age Ears: normal TM's and external ear canals both ears Throat: lips, mucosa, and tongue normal; teeth and gums normal Neck: no adenopathy, no carotid bruit, supple, symmetrical, trachea midline and thyroid not enlarged, symmetric, no tenderness/mass/nodules Back: symmetric, no curvature. ROM normal. No CVA tenderness. Lungs: clear to auscultation bilaterally Heart: regular rate and rhythm, S1, S2 normal, no murmur, click, rub or gallop Abdomen: soft, non-tender; bowel sounds normal; no masses,  no organomegaly Pulses: 2+ and symmetric Skin: Skin color, texture, turgor normal. No rashes or lesions Lymph nodes: Cervical, supraclavicular, and axillary nodes normal.  No results found for: HGBA1C  Lab Results  Component Value Date   CREATININE 0.82 01/28/2017   CREATININE 0.95 02/13/2016   CREATININE 0.79 01/17/2015    Lab Results  Component Value Date   WBC 6.4 01/28/2017   HGB 13.1 01/28/2017   HCT 39.1 01/28/2017   PLT 257 01/28/2017   GLUCOSE 86 01/28/2017   CHOL 182 01/28/2017   TRIG 60 01/28/2017   HDL 87 01/28/2017   LDLDIRECT 129.4 01/02/2013   LDLCALC 83 01/28/2017   ALT 14 01/28/2017   AST 19 01/28/2017   NA 141 01/28/2017   K 4.7 01/28/2017   CL 104  01/28/2017   CREATININE 0.82 01/28/2017   BUN 10 01/28/2017   CO2 30 01/28/2017   TSH 1.72 01/28/2017     Assessment & Plan:   Problem List Items Addressed This Visit    Hematochezia    Ddx includes infectious colitis Vs diverticulitis with diverticular bleed.  She is not toxic appearing, denies nausea,  and has not had a fever in 24 hours.  Plain abdominal films  show a normal bowel gas pattern.   Discussed starting empiric antibiotics but decided on waiting .  Clear liquid diet advised.  GI pathogen panel by PCR ordered,  Has GI evaluation with Dr Loreta Ave in 2 days.        Other Visit Diagnoses    Abdominal pain, LLQ    -  Primary   Relevant Orders   DG Abd 1 View (Completed)   Gastrointestinal Pathogen Panel PCR   Diarrhea of presumed infectious origin       Relevant Orders   Gastrointestinal Pathogen Panel PCR      I have discontinued Ms. Fare's aspirin. I am also having her maintain her multivitamin, Biotin, Vitamin D3, ALPRAZolam, levothyroxine, and raloxifene.  No orders of the defined types were placed in this encounter.   Medications Discontinued During This Encounter  Medication Reason  . aspirin 81 MG tablet Patient has not taken in last 30 days    Follow-up: No Follow-up on file.   Sherlene Shams, MD

## 2017-03-29 NOTE — Patient Instructions (Signed)
You may be recovering from diiverticulitis based on your exam.  The stool tests will be helpful in ruling out bacterial and viral infections that can cause bloody diarrhea   Diverticulitis Diverticulitis is inflammation or infection of small pouches in your colon that form when you have a condition called diverticulosis. The pouches in your colon are called diverticula. Your colon, or large intestine, is where water is absorbed and stool is formed. Complications of diverticulitis can include:  Bleeding.  Severe infection.  Severe pain.  Perforation of your colon.  Obstruction of your colon.  What are the causes? Diverticulitis is caused by bacteria. Diverticulitis happens when stool becomes trapped in diverticula. This allows bacteria to grow in the diverticula, which can lead to inflammation and infection. What increases the risk? People with diverticulosis are at risk for diverticulitis. Eating a diet that does not include enough fiber from fruits and vegetables may make diverticulitis more likely to develop. What are the signs or symptoms? Symptoms of diverticulitis may include:  Abdominal pain and tenderness. The pain is normally located on the left side of the abdomen, but may occur in other areas.  Fever and chills.  Bloating.  Cramping.  Nausea.  Vomiting.  Constipation.  Diarrhea.  Blood in your stool.  How is this diagnosed? Your health care provider will ask you about your medical history and do a physical exam. You may need to have tests done because many medical conditions can cause the same symptoms as diverticulitis. Tests may include:  Blood tests.  Urine tests.  Imaging tests of the abdomen, including X-rays and CT scans.  When your condition is under control, your health care provider may recommend that you have a colonoscopy. A colonoscopy can show how severe your diverticula are and whether something else is causing your symptoms. How is this  treated? Most cases of diverticulitis are mild and can be treated at home. Treatment may include:  Taking over-the-counter pain medicines.  Following a clear liquid diet.  Taking antibiotic medicines by mouth for 7-10 days.  More severe cases may be treated at a hospital. Treatment may include:  Not eating or drinking.  Taking prescription pain medicine.  Receiving antibiotic medicines through an IV tube.  Receiving fluids and nutrition through an IV tube.  Surgery.  Follow these instructions at home:  Follow your health care provider's instructions carefully.  Follow a full liquid diet or other diet as directed by your health care provider. After your symptoms improve, your health care provider may tell you to change your diet. He or she may recommend you eat a high-fiber diet. Fruits and vegetables are good sources of fiber. Fiber makes it easier to pass stool.  Take fiber supplements or probiotics as directed by your health care provider.  Only take medicines as directed by your health care provider.  Keep all your follow-up appointments. Contact a health care provider if:  Your pain does not improve.  You have a hard time eating food.  Your bowel movements do not return to normal. Get help right away if:  Your pain becomes worse.  Your symptoms do not get better.  Your symptoms suddenly get worse.  You have a fever.  You have repeated vomiting.  You have bloody or black, tarry stools. This information is not intended to replace advice given to you by your health care provider. Make sure you discuss any questions you have with your health care provider. Document Released: 06/09/2005 Document Revised: 02/05/2016 Document Reviewed:  07/25/2013 Elsevier Interactive Patient Education  2017 [employer].

## 2017-03-30 ENCOUNTER — Other Ambulatory Visit: Payer: No Typology Code available for payment source

## 2017-03-30 DIAGNOSIS — R197 Diarrhea, unspecified: Secondary | ICD-10-CM

## 2017-03-30 DIAGNOSIS — R1032 Left lower quadrant pain: Secondary | ICD-10-CM

## 2017-03-31 ENCOUNTER — Encounter: Payer: Self-pay | Admitting: Internal Medicine

## 2017-03-31 DIAGNOSIS — K921 Melena: Secondary | ICD-10-CM | POA: Insufficient documentation

## 2017-03-31 NOTE — Assessment & Plan Note (Addendum)
Ddx includes infectious colitis Vs diverticulitis with diverticular bleed.  She is not toxic appearing, denies nausea,  and has not had a fever in 24 hours.  Plain abdominal films  show a normal bowel gas pattern.   Discussed starting empiric antibiotics but decided on waiting .  Clear liquid diet advised.  GI pathogen panel by PCR ordered,  Has GI evaluation with Dr Loreta AveMann in 2 days.

## 2017-04-04 ENCOUNTER — Telehealth: Payer: Self-pay

## 2017-04-04 LAB — GASTROINTESTINAL PATHOGEN PANEL PCR
C. DIFFICILE TOX A/B, PCR: DETECTED — AB
E coli (STEC) stx1/stx2, PCR: DETECTED — CR

## 2017-04-04 MED ORDER — VANCOMYCIN HCL 125 MG PO CAPS: 125.0000 mg | ORAL_CAPSULE | Freq: Four times a day (QID) | ORAL | 0 refills | Status: DC

## 2017-04-04 NOTE — Telephone Encounter (Signed)
Critical lab.  C-Dif- was detected E-Coli- detected/ Indeterminate Cryptosporidium- Indeterminate/Abnormal.  [facility].

## 2017-04-04 NOTE — Telephone Encounter (Signed)
I am Sending an rx for vancomycin  to take 4 times daily for  ten days to [facility]

## 2017-04-04 NOTE — Addendum Note (Signed)
Addended by: Sherlene ShamsULLO, Thomasene Dubow L on: 04/04/2017 07:08 PM   Modules accepted: Orders

## 2017-04-04 NOTE — Telephone Encounter (Signed)
Pt was notified and gave verbal understanding

## 2017-04-21 LAB — HM COLONOSCOPY

## 2017-04-22 DIAGNOSIS — M23306 Other meniscus derangements, unspecified meniscus, right knee: Secondary | ICD-10-CM | POA: Insufficient documentation

## 2017-04-29 ENCOUNTER — Ambulatory Visit: Payer: No Typology Code available for payment source | Admitting: Gastroenterology

## 2017-04-29 LAB — HM COLONOSCOPY

## 2017-06-03 ENCOUNTER — Ambulatory Visit (INDEPENDENT_AMBULATORY_CARE_PROVIDER_SITE_OTHER): Payer: No Typology Code available for payment source | Admitting: Internal Medicine

## 2017-06-03 ENCOUNTER — Encounter: Payer: Self-pay | Admitting: Internal Medicine

## 2017-06-03 VITALS — BP 142/82 | HR 66 | Ht 66.0 in | Wt 139.8 lb

## 2017-06-03 DIAGNOSIS — I359 Nonrheumatic aortic valve disorder, unspecified: Secondary | ICD-10-CM

## 2017-06-03 NOTE — Progress Notes (Signed)
Cardiology Office Note   Date:  06/03/2017   ID:  Theresa [name], DOB 05-20-1956, MRN [id number]  PCP:  Sherlene Shams, MD  Cardiologist:   Dietrich Pates, MD   Pt referred by Duncan Dull for AV dz      History of Present Illness: Theresa [name] is a 61 y.o. 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 ALPRAZolam (XANAX) 0.25 MG tablet Take 0.25 mg by mouth at bedtime as needed for anxiety.  . Biotin 1000 MCG CHEW Chew by mouth.  . Cholecalciferol (VITAMIN D3) 1000 units CAPS Take by mouth daily.  [name] levothyroxine (SYNTHROID, LEVOTHROID) 88 MCG tablet TAKE ONE TABLET BY MOUTH ONCE DAILY  . Multiple Vitamin (MULTIVITAMIN) tablet Take 1 tablet by mouth daily.    . raloxifene (EVISTA) 60 MG tablet TAKE ONE TABLET BY MOUTH ONCE DAILY     Allergies:   Meloxicam; Topamax [topiramate]; Adhesive [tape]; and Tramadol   Past Medical History:  Diagnosis Date  . Breast screening, unspecified   . H/O: Bell's palsy   . Hypercalcemia   . Osteoporosis   . Screening for malignant neoplasm of the cervix   . Special screening for malignant neoplasms, colon   . Unspecified hypothyroidism    thyroid nodule  . Unspecified otitis media     Past Surgical History:  Procedure Laterality Date  . ANTERIOR CERVICAL DECOMP/DISCECTOMY FUSION  Dec 2014   C3 ,4 ,5 [place]  . ANTERIOR FUSION CERVICAL SPINE  jan 2014   Botero  c5-c6  . CHOLECYSTECTOMY  1989  . dental implant    . MENISCUS REPAIR  May 2011   [place]  . PARTIAL HYSTERECTOMY     secondary to fibroids  . SPINE SURGERY     [place], C3, 4 5     Social History:  The patient  reports that she has never smoked. She has never used smokeless tobacco. She reports that she drinks about 2.5 oz of alcohol per week .    Family History:  The patient's family history includes Alzheimer's disease in her mother; Breast cancer (age of onset: 34) in her sister; Cancer (age of onset: 65) in her paternal aunt and sister; Hyperlipidemia in her father; Thyroid nodules in her maternal grandmother, mother, and sister.    ROS:  Please see the history of present illness. All other systems are reviewed and  Negative to the above problem except as noted.    PHYSICAL EXAM: VS:  BP (!) 142/82   Pulse 66   Ht  (1.676 m)   Wt 139 lb 12.8 oz (63.4 kg)   BMI 22.56 kg/m   GEN: Well nourished, well developed, in no acute distress  HEENT: normal  Neck: no JVD, carotid bruits, or masses Cardiac: RRR  Gr II/VI diastolic murmur LSB, rubs, or gallops,no edema  Respiratory:  clear to auscultation bilaterally, normal work of breathing GI: soft, nontender, nondistended, + BS  No hepatomegaly  MS: no deformity Moving all extremities   Skin: warm and dry, no rash Neuro:  Strength and sensation are intact Psych: euthymic mood, full affect   EKG:  EKG is ordered  today.  SR 66 bpm  Gr II/VI diastolic murmur LSB      Lipid Panel    Component Value Date/Time   CHOL 182 01/28/2017 0900   TRIG 60 01/28/2017 0900   HDL 87 01/28/2017 0900   CHOLHDL 2.1 01/28/2017 0900   VLDL 12 01/28/2017 0900   LDLCALC 83 01/28/2017 0900   LDLDIRECT 129.4 01/02/2013 1453      Wt Readings from Last 3 Encounters:  06/03/17 139 lb 12.8 oz (63.4 kg)  03/29/17 140 lb (63.5 kg)  02/04/17 136 lb 9.6 oz (62 kg)      ASSESSMENT AND PLAN:  1  AV dz  Pt with quadricuspid valve  Moderate AI  No AS   LVEDD 51 mm   Pt asymptomatic  LVEDD upper normal I would recom f/u echo later this winter   If unchanged the 1 year with clnic appt No extremme wt lifting  Othrewase stay active     Current medicines are reviewed at length with the patient today.  The patient does not have concerns regarding medicines.  Signed, Dietrich Pates, MD  06/03/2017  10:51 AM    [facility] [address] [place], [place], [place]  [address] Phone: [phone]; Fax: [phone]

## 2017-06-03 NOTE — Patient Instructions (Signed)
Your physician recommends that you continue on your current medications as directed. Please refer to the Current Medication list given to you today.  Please schedule an echocardiogram [facility] office) in around the end of January 2019.  After that we will schedule echo January 2020 [facility] office) and then a visit with Dr. Tenny Craw [facility] office)

## 2017-07-08 ENCOUNTER — Other Ambulatory Visit: Payer: Self-pay | Admitting: Neurosurgery

## 2017-07-11 ENCOUNTER — Encounter: Payer: Self-pay | Admitting: Internal Medicine

## 2017-07-11 DIAGNOSIS — E034 Atrophy of thyroid (acquired): Secondary | ICD-10-CM

## 2017-07-11 DIAGNOSIS — Z79899 Other long term (current) drug therapy: Secondary | ICD-10-CM

## 2017-07-15 ENCOUNTER — Other Ambulatory Visit (INDEPENDENT_AMBULATORY_CARE_PROVIDER_SITE_OTHER): Payer: No Typology Code available for payment source

## 2017-07-15 ENCOUNTER — Telehealth: Payer: Self-pay | Admitting: Internal Medicine

## 2017-07-15 DIAGNOSIS — E034 Atrophy of thyroid (acquired): Secondary | ICD-10-CM | POA: Diagnosis not present

## 2017-07-15 DIAGNOSIS — M85859 Other specified disorders of bone density and structure, unspecified thigh: Secondary | ICD-10-CM

## 2017-07-15 DIAGNOSIS — Z79899 Other long term (current) drug therapy: Secondary | ICD-10-CM

## 2017-07-15 LAB — COMPREHENSIVE METABOLIC PANEL
AG Ratio: 1.8 (calc) (ref 1.0–2.5)
ALBUMIN MSPROF: 4.2 g/dL (ref 3.6–5.1)
ALT: 13 U/L (ref 6–29)
AST: 20 U/L (ref 10–35)
Alkaline phosphatase (APISO): 61 U/L (ref 33–130)
BILIRUBIN TOTAL: 0.5 mg/dL (ref 0.2–1.2)
BUN: 14 mg/dL (ref 7–25)
CO2: 27 mmol/L (ref 20–32)
Calcium: 9.2 mg/dL (ref 8.6–10.4)
Chloride: 103 mmol/L (ref 98–110)
Creat: 0.76 mg/dL (ref 0.50–1.05)
GLUCOSE: 89 mg/dL (ref 65–99)
Globulin: 2.3 g/dL (calc) (ref 1.9–3.7)
POTASSIUM: 4.8 mmol/L (ref 3.5–5.3)
SODIUM: 139 mmol/L (ref 135–146)
TOTAL PROTEIN: 6.5 g/dL (ref 6.1–8.1)

## 2017-07-15 LAB — TSH: TSH: 1.23 mIU/L (ref 0.40–4.50)

## 2017-07-15 NOTE — Addendum Note (Signed)
Addended by: Penne LashWIGGINS, Brodi Nery N on: 07/15/2017 10:49 AM   Modules accepted: Orders

## 2017-07-15 NOTE — Telephone Encounter (Signed)
Pt needs a referral for a bone density test. Please call her at [phone].

## 2017-07-15 NOTE — Telephone Encounter (Signed)
Order has been placed for Dexa scan will you please sign off on it.

## 2017-07-19 ENCOUNTER — Encounter: Payer: Self-pay | Admitting: Internal Medicine

## 2017-07-19 ENCOUNTER — Other Ambulatory Visit: Payer: Self-pay | Admitting: Internal Medicine

## 2017-07-19 DIAGNOSIS — Z1231 Encounter for screening mammogram for malignant neoplasm of breast: Secondary | ICD-10-CM

## 2017-08-12 ENCOUNTER — Other Ambulatory Visit: Payer: Self-pay | Admitting: Internal Medicine

## 2017-08-12 ENCOUNTER — Ambulatory Visit
Admission: RE | Admit: 2017-08-12 | Discharge: 2017-08-12 | Disposition: A | Discharge status: Home / Self Care | Payer: No Typology Code available for payment source | Source: Ambulatory Visit | Attending: Internal Medicine | Admitting: Internal Medicine

## 2017-08-12 DIAGNOSIS — Z1231 Encounter for screening mammogram for malignant neoplasm of breast: Secondary | ICD-10-CM | POA: Insufficient documentation

## 2017-08-12 NOTE — Telephone Encounter (Signed)
Refilled: looking in historical med list looks like it was refilled on 12/20/2016 Last OV: 03/29/2017 Next OV: 02/10/2018

## 2017-08-12 NOTE — Telephone Encounter (Signed)
Printed, signed and faxed.  

## 2017-08-22 ENCOUNTER — Inpatient Hospital Stay (HOSPITAL_COMMUNITY): Admission: RE | Admit: 2017-08-22 | Payer: No Typology Code available for payment source | Source: Ambulatory Visit

## 2017-08-25 NOTE — Pre-Procedure Instructions (Signed)
Theresa [name]  08/25/2017      [facility] [address], [place] - [address] [place] [address] Phone: [phone] Fax: [phone]    Your procedure is scheduled on Wednesday, Dec. 19th   [facility] Admitting at 6:30 AM             (posted surgery time 8:30a - 1:02p)   Call this number if you have problems the morning of surgery:  [phone]   Remember:                4-5 days prior to surgery, STOP TAKING any Vitamins, Herbal Supplements, Anti-inflammatories   Do not eat food or drink liquids after midnight, Tuesday.   Take these medicines the morning of surgery with A SIP OF WATER : Xanax, Levothyroxine   Do not wear jewelry, make-up or nail polish.  Do not wear lotions, powders, perfumes, or deodorant.  Do not shave 48 hours prior to surgery.     Do not bring valuables to the hospital.  [facility] is not responsible for any belongings or valuables.  Contacts, dentures or bridgework may not be worn into surgery.  Leave your suitcase in the car.  After surgery it may be brought to your room.  For patients admitted to the hospital, discharge time will be determined by your treatment team.  Please read over the following fact sheets that you were given. Pain Booklet, MRSA Information and Surgical Site Infection Prevention       [name]- Preparing For Surgery  Before surgery, you can play an important role. Because skin is not sterile, your skin needs to be as free of germs as possible. You can reduce the number of germs on your skin by washing with CHG (chlorahexidine gluconate) Soap before surgery.  CHG is an antiseptic cleaner which kills germs and bonds with the skin to continue killing germs even after washing.  Please do not use if you have an allergy to CHG or antibacterial soaps. If your skin becomes reddened/irritated stop using the CHG.  Do not shave (including legs and underarms) for at least  48 hours prior to first CHG shower. It is OK to shave your face.  Please follow these instructions carefully.   1. Shower the NIGHT BEFORE SURGERY and the MORNING OF SURGERY with CHG.   2. If you chose to wash your hair, wash your hair first as usual with your normal shampoo.  3. After you shampoo, rinse your hair and body thoroughly to remove the shampoo.  4. Use CHG as you would any other liquid soap. You can apply CHG directly to the skin and wash gently with a scrungie or a clean washcloth.   5. Apply the CHG Soap to your body ONLY FROM THE NECK DOWN.  Do not use on open wounds or open sores. Avoid contact with your eyes, ears, mouth and genitals (private parts). Wash Face and genitals (private parts)  with your normal soap.  6. Wash thoroughly, paying special attention to the area where your surgery will be performed.  7. Thoroughly rinse your body with warm water from the neck down.  8. DO NOT shower/wash with your normal soap after using and rinsing off the CHG Soap.  9. Pat yourself dry with a CLEAN TOWEL.  10. Wear CLEAN PAJAMAS to bed the night before surgery, wear comfortable clothes the morning of surgery  11. Place CLEAN SHEETS on your  bed the night of your first shower and DO NOT SLEEP WITH PETS.    Day of Surgery: Do not apply any deodorants/lotions. Please wear clean clothes to the hospital/surgery center.

## 2017-08-26 ENCOUNTER — Encounter (HOSPITAL_COMMUNITY)
Admission: RE | Admit: 2017-08-26 | Discharge: 2017-08-26 | Disposition: A | Discharge status: Home / Self Care | Payer: No Typology Code available for payment source | Source: Ambulatory Visit | Attending: Neurosurgery | Admitting: Neurosurgery

## 2017-08-26 ENCOUNTER — Encounter (HOSPITAL_COMMUNITY): Payer: Self-pay

## 2017-08-26 ENCOUNTER — Other Ambulatory Visit: Payer: Self-pay

## 2017-08-26 DIAGNOSIS — E039 Hypothyroidism, unspecified: Secondary | ICD-10-CM | POA: Diagnosis not present

## 2017-08-26 DIAGNOSIS — M48061 Spinal stenosis, lumbar region without neurogenic claudication: Secondary | ICD-10-CM | POA: Diagnosis not present

## 2017-08-26 HISTORY — DX: Headache: R51

## 2017-08-26 HISTORY — DX: Headache, unspecified: R51.9

## 2017-08-26 HISTORY — DX: Cardiac murmur, unspecified: R01.1

## 2017-08-26 HISTORY — DX: Personal history of urinary calculi: Z87.442

## 2017-08-26 LAB — BASIC METABOLIC PANEL
Anion gap: 6 (ref 5–15)
BUN: 12 mg/dL (ref 6–20)
CO2: 28 mmol/L (ref 22–32)
CREATININE: 0.75 mg/dL (ref 0.44–1.00)
Calcium: 9.3 mg/dL (ref 8.9–10.3)
Chloride: 104 mmol/L (ref 101–111)
GFR calc Af Amer: >60 mL/min (ref >60)
GLUCOSE: 94 mg/dL (ref 65–99)
Potassium: 4.3 mmol/L (ref 3.5–5.1)
SODIUM: 138 mmol/L (ref 135–145)

## 2017-08-26 LAB — SURGICAL PCR SCREEN
MRSA, PCR: NEGATIVE
Staphylococcus aureus: NEGATIVE

## 2017-08-26 LAB — CBC
HCT: 41.4 % (ref 36.0–46.0)
Hemoglobin: 13.8 g/dL (ref 12.0–15.0)
MCH: 31 pg (ref 26.0–34.0)
MCHC: 33.3 g/dL (ref 30.0–36.0)
MCV: 93 fL (ref 78.0–100.0)
PLATELETS: 247 10*3/uL (ref 150–400)
RBC: 4.45 MIL/uL (ref 3.87–5.11)
RDW: 13.3 % (ref 11.5–15.5)
WBC: 5.9 10*3/uL (ref 4.0–10.5)

## 2017-08-26 LAB — ABO/RH: ABO/RH(D): A NEG

## 2017-08-26 LAB — TYPE AND SCREEN
ABO/RH(D): A NEG
Antibody Screen: NEGATIVE

## 2017-08-26 NOTE — Pre-Procedure Instructions (Signed)
Theresa PeonRebecca A [name]  08/26/2017      [facility] [address], [place] - [address] [place] [address] Phone: [phone] Fax: [phone]    Your procedure is scheduled on Wednesday, Dec. 19th   [facility] Ad[facility] at 6:30 AM           Call this number if you have problems the morning of surgery:  [phone]   Remember:                Starting  Today12/14/18, STOP TAKING any All Vitamins, Herbal Supplements, Anti-inflammatories (advil, aleve, ibuprofen,naproxen) aspirin,fish oil,biotin   Do not eat food or drink liquids after midnight, Tuesday.   Take these medicines the morning of surgery with A SIP OF WATER : Xanax, Levothyroxine   Do not wear jewelry, make-up or nail polish.  Do not wear lotions, powders, perfumes, or deodorant.  Do not shave 48 hours prior to surgery.     Do not bring valuables to the hospital.  [facility] is not responsible for any belongings or valuables.  Contacts, dentures or bridgework may not be worn into surgery.  Leave your suitcase in the car.  After surgery it may be brought to your room.  For patients admitted to the hospital, discharge time will be determined by your treatment team.  Please read over the  fact sheets that you were given. Pain Booklet, MRSA Information and Surgical Site Infection Prevention       [place]- Preparing For Surgery  Before surgery, you can play an important role. Because skin is not sterile, your skin needs to be as free of germs as possible. You can reduce the number of germs on your skin by washing with CHG (chlorahexidine gluconate) Soap before surgery.  CHG is an antiseptic cleaner which kills germs and bonds with the skin to continue killing germs even after washing.  Please do not use if you have an allergy to CHG or antibacterial soaps. If your skin becomes reddened/irritated stop using the CHG.  Do not shave (including legs and underarms)  for at least 48 hours prior to first CHG shower. It is OK to shave your face.  Please follow these instructions carefully.   1. Shower the NIGHT BEFORE SURGERY and the MORNING OF SURGERY with CHG.   2. If you chose to wash your hair, wash your hair first as usual with your normal shampoo.  3. After you shampoo, rinse your hair and body thoroughly to remove the shampoo.  4. Use CHG as you would any other liquid soap. You can apply CHG directly to the skin and wash gently with a scrungie or a clean washcloth.   5. Apply the CHG Soap to your body ONLY FROM THE NECK DOWN.  Do not use on open wounds or open sores. Avoid contact with your eyes, ears, mouth and genitals (private parts). Wash Face and genitals (private parts)  with your normal soap.  6. Wash thoroughly, paying special attention to the area where your surgery will be performed.  7. Thoroughly rinse your body with warm water from the neck down.  8. DO NOT shower/wash with your normal soap after using and rinsing off the CHG Soap.  9. Pat yourself dry with a CLEAN TOWEL.  10. Wear CLEAN PAJAMAS to bed the night before surgery, wear comfortable clothes the morning of surgery  11. Place CLEAN SHEETS on your bed the night of your first  shower and DO NOT SLEEP WITH PETS.    Day of Surgery: Do not apply any deodorants/lotions. Please wear clean clothes to the hospital/surgery center.

## 2017-08-26 NOTE — Pre-Procedure Instructions (Signed)
  Theresa [name]  08/26/2017      [facility] - [place], [place] - [address] [place] [address] Phone: [phone] Fax: [phone]    Your procedure is scheduled on Wednesday, Dec. 19th   Report to [facility] Admitting at 6:30 AM           Call this number if you have problems the morning of surgery:  [phone]   Remember:                Starting  Today12/14/18, STOP TAKING any All Vitamins, Herbal Supplements, Anti-inflammatories (advil, aleve, ibuprofen,naproxen) aspirin,fish oil,biotin   Do not eat food or drink liquids after midnight, Tuesday.   Take these medicines the morning of surgery with A SIP OF WATER : Xanax, Levothyroxine   Do not wear jewelry, make-up or nail polish.  Do not wear lotions, powders, perfumes, or deodorant.  Do not shave 48 hours prior to surgery.     Do not bring valuables to the hospital.  [facility] is not responsible for any belongings or valuables.  Contacts, dentures or bridgework may not be worn into surgery.  Leave your suitcase in the car.  After surgery it may be brought to your room.  For patients admitted to the hospital, discharge time will be determined by your treatment team.  Please read over the  fact sheets that you were given. Pain Booklet, MRSA Information and Surgical Site Infection Prevention       [facility]- Preparing For Surgery  Before surgery, you can play an important role. Because skin is not sterile, your skin needs to be as free of germs as possible. You can reduce the number of germs on your skin by washing with CHG (chlorahexidine gluconate) Soap before surgery.  CHG is an antiseptic cleaner which kills germs and bonds with the skin to continue killing germs even after washing.  Please do not use if you have an allergy to CHG or antibacterial soaps. If your skin becomes reddened/irritated stop using the CHG.  Do not shave (including legs and underarms)  for at least 48 hours prior to first CHG shower. It is OK to shave your face.  Please follow these instructions carefully.   1. Shower the NIGHT BEFORE SURGERY and the MORNING OF SURGERY with CHG.   2. If you chose to wash your hair, wash your hair first as usual with your normal shampoo.  3. After you shampoo, rinse your hair and body thoroughly to remove the shampoo.  4. Use CHG as you would any other liquid soap. You can apply CHG directly to the skin and wash gently with a scrungie or a clean washcloth.   5. Apply the CHG Soap to your body ONLY FROM THE NECK DOWN.  Do not use on open wounds or open sores. Avoid contact with your eyes, ears, mouth and genitals (private parts). Wash Face and genitals (private parts)  with your normal soap.  6. Wash thoroughly, paying special attention to the area where your surgery will be performed.  7. Thoroughly rinse your body with warm water from the neck down.  8. DO NOT shower/wash with your normal soap after using and rinsing off the CHG Soap.  9. Pat yourself dry with a CLEAN TOWEL.  10. Wear CLEAN PAJAMAS to bed the night before surgery, wear comfortable clothes the morning of surgery  11. Place CLEAN SHEETS on your bed the night of your first   shower and DO NOT SLEEP WITH PETS.    Day of Surgery: Do not apply any deodorants/lotions. Please wear clean clothes to the hospital/surgery center.

## 2017-08-29 NOTE — Progress Notes (Signed)
Anesthesia Chart Review:  Pt is a 61 year old female scheduled for L3-4 lumbar decompression, PLIF, PLA on 08/31/2017 with Shirlean Kellyobert Nudelman, MD  - PCP is Duncan Dulleresa Tullo, MD - Cardiologist is Dietrich PatesPaula Ross, MD. Last office visit 06/03/17.   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 Never smoker. BMI 23  Medications include: levothyroxine  BP 130/71   Pulse 69   Temp 36.8 C   Resp 20   Ht 5\' 6"  (1.676 m)   Wt 141 lb 6.4 oz (64.1 kg)   SpO2 98%   BMI 22.82 kg/m    Preoperative labs reviewed.    EKG 06/03/17: NSR. Possible LA enlargement. Septal infarct, age undetermined  Echo 03/11/17:  Left ventricle: Systolic function was normal. The estimated ejection fraction was in the range of 55% to 60%. Left ventricular diastolic function parameters were normal. - Aortic valve: Quadricuspid; mildly thickened leaflets. There was moderate regurgitation. - Mitral valve: Mildly thickened leaflets . Mild, late   systolicprolapse. There was mild regurgitation. - Pulmonary arteries: Systolic pressure was within the normal range. 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  If no changes, I anticipate pt can proceed with surgery as scheduled.   Theresa Mastngela Ivania Teagarden, FNP-BC [facility] Phone: [phone] 08/29/2017 10:43 AM

## 2017-08-30 ENCOUNTER — Ambulatory Visit
Admission: RE | Admit: 2017-08-30 | Discharge: 2017-08-30 | Disposition: A | Discharge status: Home / Self Care | Payer: No Typology Code available for payment source | Source: Ambulatory Visit | Attending: Internal Medicine | Admitting: Internal Medicine

## 2017-08-30 DIAGNOSIS — M8588 Other specified disorders of bone density and structure, other site: Secondary | ICD-10-CM | POA: Insufficient documentation

## 2017-08-30 DIAGNOSIS — M85851 Other specified disorders of bone density and structure, right thigh: Secondary | ICD-10-CM | POA: Diagnosis not present

## 2017-08-30 DIAGNOSIS — M85859 Other specified disorders of bone density and structure, unspecified thigh: Secondary | ICD-10-CM | POA: Insufficient documentation

## 2017-08-31 ENCOUNTER — Encounter (HOSPITAL_COMMUNITY)
Admission: RE | Disposition: A | Discharge status: Home / Self Care | Payer: Self-pay | Source: Ambulatory Visit | Attending: Neurosurgery

## 2017-08-31 ENCOUNTER — Encounter (HOSPITAL_COMMUNITY): Payer: Self-pay

## 2017-08-31 ENCOUNTER — Encounter: Payer: Self-pay | Admitting: Internal Medicine

## 2017-08-31 ENCOUNTER — Inpatient Hospital Stay (HOSPITAL_COMMUNITY)
Admission: RE | Admit: 2017-08-31 | Discharge: 2017-09-01 | DRG: 455 | Disposition: A | Discharge status: Home / Self Care | Payer: No Typology Code available for payment source | Source: Ambulatory Visit | Attending: Neurosurgery | Admitting: Neurosurgery

## 2017-08-31 ENCOUNTER — Inpatient Hospital Stay (HOSPITAL_COMMUNITY): Payer: No Typology Code available for payment source | Admitting: Anesthesiology

## 2017-08-31 ENCOUNTER — Inpatient Hospital Stay (HOSPITAL_COMMUNITY): Payer: No Typology Code available for payment source

## 2017-08-31 ENCOUNTER — Inpatient Hospital Stay (HOSPITAL_COMMUNITY): Payer: No Typology Code available for payment source | Admitting: Vascular Surgery

## 2017-08-31 DIAGNOSIS — Z91048 Other nonmedicinal substance allergy status: Secondary | ICD-10-CM | POA: Diagnosis not present

## 2017-08-31 DIAGNOSIS — Z885 Allergy status to narcotic agent status: Secondary | ICD-10-CM | POA: Diagnosis not present

## 2017-08-31 DIAGNOSIS — Z79890 Hormone replacement therapy (postmenopausal): Secondary | ICD-10-CM

## 2017-08-31 DIAGNOSIS — M48062 Spinal stenosis, lumbar region with neurogenic claudication: Principal | ICD-10-CM | POA: Diagnosis present

## 2017-08-31 DIAGNOSIS — Z79899 Other long term (current) drug therapy: Secondary | ICD-10-CM

## 2017-08-31 DIAGNOSIS — E039 Hypothyroidism, unspecified: Secondary | ICD-10-CM | POA: Diagnosis present

## 2017-08-31 DIAGNOSIS — M4726 Other spondylosis with radiculopathy, lumbar region: Secondary | ICD-10-CM | POA: Diagnosis present

## 2017-08-31 DIAGNOSIS — Z886 Allergy status to analgesic agent status: Secondary | ICD-10-CM

## 2017-08-31 DIAGNOSIS — Z419 Encounter for procedure for purposes other than remedying health state, unspecified: Secondary | ICD-10-CM

## 2017-08-31 DIAGNOSIS — Z90710 Acquired absence of both cervix and uterus: Secondary | ICD-10-CM

## 2017-08-31 DIAGNOSIS — Z981 Arthrodesis status: Secondary | ICD-10-CM

## 2017-08-31 DIAGNOSIS — M4185 Other forms of scoliosis, thoracolumbar region: Secondary | ICD-10-CM | POA: Diagnosis present

## 2017-08-31 DIAGNOSIS — M810 Age-related osteoporosis without current pathological fracture: Secondary | ICD-10-CM | POA: Diagnosis present

## 2017-08-31 SURGERY — POSTERIOR LUMBAR FUSION 1 LEVEL
Anesthesia: General | Site: Back

## 2017-08-31 MED ORDER — PROPOFOL 10 MG/ML IV BOLUS
INTRAVENOUS | Status: AC
  Filled 2017-08-31: qty 20

## 2017-08-31 MED ORDER — FENTANYL CITRATE (PF) 100 MCG/2ML IJ SOLN
25.0000 ug | INTRAMUSCULAR | Status: DC | PRN
  Administered 2017-08-31 (×3): 50 ug via INTRAVENOUS

## 2017-08-31 MED ORDER — CYCLOBENZAPRINE HCL 5 MG PO TABS
5.0000 mg | ORAL_TABLET | Freq: Three times a day (TID) | ORAL | Status: DC | PRN
  Administered 2017-08-31: 10 mg via ORAL

## 2017-08-31 MED ORDER — ONDANSETRON HCL 4 MG/2ML IJ SOLN: 4.0000 mg | Freq: Four times a day (QID) | INTRAMUSCULAR | Status: DC | PRN

## 2017-08-31 MED ORDER — ONDANSETRON HCL 4 MG/2ML IJ SOLN
INTRAMUSCULAR | Status: AC
  Filled 2017-08-31: qty 2

## 2017-08-31 MED ORDER — THROMBIN (RECOMBINANT) 20000 UNITS EX SOLR
CUTANEOUS | Status: AC
  Filled 2017-08-31: qty 20000

## 2017-08-31 MED ORDER — LIDOCAINE-EPINEPHRINE 1 %-1:100000 IJ SOLN
INTRAMUSCULAR | Status: AC
  Filled 2017-08-31: qty 1

## 2017-08-31 MED ORDER — CHLORHEXIDINE GLUCONATE CLOTH 2 % EX PADS: 6.0000 | MEDICATED_PAD | Freq: Once | CUTANEOUS | Status: DC

## 2017-08-31 MED ORDER — ACETAMINOPHEN 325 MG PO TABS: 650.0000 mg | ORAL_TABLET | ORAL | Status: DC | PRN

## 2017-08-31 MED ORDER — ACETAMINOPHEN 10 MG/ML IV SOLN
INTRAVENOUS | Status: AC
  Filled 2017-08-31: qty 100

## 2017-08-31 MED ORDER — KETOROLAC TROMETHAMINE 30 MG/ML IJ SOLN
INTRAMUSCULAR | Status: AC
  Filled 2017-08-31: qty 1

## 2017-08-31 MED ORDER — GLYCOPYRROLATE 0.2 MG/ML IJ SOLN
INTRAMUSCULAR | Status: DC | PRN
  Administered 2017-08-31: 0.2 mg via INTRAVENOUS

## 2017-08-31 MED ORDER — ARTIFICIAL TEARS OPHTHALMIC OINT
TOPICAL_OINTMENT | OPHTHALMIC | Status: AC
  Filled 2017-08-31: qty 3.5

## 2017-08-31 MED ORDER — ALPRAZOLAM 0.25 MG PO TABS: 0.2500 mg | ORAL_TABLET | Freq: Two times a day (BID) | ORAL | Status: DC | PRN

## 2017-08-31 MED ORDER — ALUM & MAG HYDROXIDE-SIMETH 200-200-20 MG/5ML PO SUSP: 30.0000 mL | Freq: Four times a day (QID) | ORAL | Status: DC | PRN

## 2017-08-31 MED ORDER — KCL IN DEXTROSE-NACL 20-5-0.45 MEQ/L-%-% IV SOLN: INTRAVENOUS | Status: DC

## 2017-08-31 MED ORDER — HYDROXYZINE HCL 50 MG/ML IM SOLN: 50.0000 mg | INTRAMUSCULAR | Status: DC | PRN

## 2017-08-31 MED ORDER — RALOXIFENE HCL 60 MG PO TABS
60.0000 mg | ORAL_TABLET | Freq: Every day | ORAL | Status: DC
  Administered 2017-09-01: 60 mg via ORAL
  Filled 2017-08-31 (×2): qty 1

## 2017-08-31 MED ORDER — MORPHINE SULFATE (PF) 4 MG/ML IV SOLN
4.0000 mg | INTRAVENOUS | Status: DC | PRN
  Administered 2017-08-31: 4 mg via INTRAMUSCULAR
  Filled 2017-08-31: qty 1

## 2017-08-31 MED ORDER — THROMBIN (RECOMBINANT) 5000 UNITS EX SOLR
CUTANEOUS | Status: AC
  Filled 2017-08-31: qty 5000

## 2017-08-31 MED ORDER — CEFAZOLIN SODIUM-DEXTROSE 2-4 GM/100ML-% IV SOLN
2.0000 g | INTRAVENOUS | Status: AC
  Administered 2017-08-31: 2 g via INTRAVENOUS
  Filled 2017-08-31: qty 100

## 2017-08-31 MED ORDER — CYCLOBENZAPRINE HCL 10 MG PO TABS
ORAL_TABLET | ORAL | Status: AC
  Administered 2017-08-31: 10 mg via ORAL
  Filled 2017-08-31: qty 1

## 2017-08-31 MED ORDER — LIDOCAINE 2% (20 MG/ML) 5 ML SYRINGE
INTRAMUSCULAR | Status: AC
  Filled 2017-08-31: qty 5

## 2017-08-31 MED ORDER — SODIUM CHLORIDE 0.9% FLUSH
3.0000 mL | Freq: Two times a day (BID) | INTRAVENOUS | Status: DC
  Administered 2017-08-31: 3 mL via INTRAVENOUS

## 2017-08-31 MED ORDER — VITAMIN D 1000 UNITS PO TABS
1000.0000 [IU] | ORAL_TABLET | Freq: Every day | ORAL | Status: DC
  Administered 2017-09-01: 1000 [IU] via ORAL
  Filled 2017-08-31: qty 1

## 2017-08-31 MED ORDER — FENTANYL CITRATE (PF) 100 MCG/2ML IJ SOLN
INTRAMUSCULAR | Status: AC
  Administered 2017-08-31: 50 ug via INTRAVENOUS
  Filled 2017-08-31: qty 2

## 2017-08-31 MED ORDER — ACETAMINOPHEN 10 MG/ML IV SOLN
1000.0000 mg | Freq: Once | INTRAVENOUS | Status: AC
  Administered 2017-08-31: 1000 mg via INTRAVENOUS

## 2017-08-31 MED ORDER — FENTANYL CITRATE (PF) 100 MCG/2ML IJ SOLN
INTRAMUSCULAR | Status: AC
  Filled 2017-08-31: qty 2

## 2017-08-31 MED ORDER — PROPOFOL 10 MG/ML IV BOLUS
INTRAVENOUS | Status: DC | PRN
  Administered 2017-08-31: 160 mg via INTRAVENOUS

## 2017-08-31 MED ORDER — FENTANYL CITRATE (PF) 100 MCG/2ML IJ SOLN
INTRAMUSCULAR | Status: DC | PRN
Start: 2017-08-31 — End: 2017-08-31
  Administered 2017-08-31: 100 ug via INTRAVENOUS

## 2017-08-31 MED ORDER — ONDANSETRON HCL 4 MG/2ML IJ SOLN
INTRAMUSCULAR | Status: DC | PRN
  Administered 2017-08-31: 4 mg via INTRAVENOUS

## 2017-08-31 MED ORDER — KETOROLAC TROMETHAMINE 30 MG/ML IJ SOLN
30.0000 mg | Freq: Four times a day (QID) | INTRAMUSCULAR | Status: DC
Start: 2017-08-31 — End: 2017-09-01
  Administered 2017-08-31 – 2017-09-01 (×3): 30 mg via INTRAVENOUS
  Filled 2017-08-31 (×3): qty 1

## 2017-08-31 MED ORDER — THROMBIN (RECOMBINANT) 5000 UNITS EX SOLR
OROMUCOSAL | Status: DC | PRN
  Administered 2017-08-31: 10:00:00 via TOPICAL

## 2017-08-31 MED ORDER — ROCURONIUM BROMIDE 100 MG/10ML IV SOLN
INTRAVENOUS | Status: DC | PRN
  Administered 2017-08-31: 30 mg via INTRAVENOUS
  Administered 2017-08-31: 50 mg via INTRAVENOUS

## 2017-08-31 MED ORDER — SODIUM CHLORIDE 0.9 % IR SOLN
Status: DC | PRN
  Administered 2017-08-31 (×2)

## 2017-08-31 MED ORDER — FLEET ENEMA 7-19 GM/118ML RE ENEM: 1.0000 | ENEMA | Freq: Once | RECTAL | Status: DC | PRN

## 2017-08-31 MED ORDER — PHENOL 1.4 % MT LIQD: 1.0000 | OROMUCOSAL | Status: DC | PRN

## 2017-08-31 MED ORDER — SODIUM CHLORIDE 0.9% FLUSH: 3.0000 mL | INTRAVENOUS | Status: DC | PRN

## 2017-08-31 MED ORDER — SODIUM CHLORIDE 0.9 % IV SOLN: 250.0000 mL | INTRAVENOUS | Status: DC

## 2017-08-31 MED ORDER — BISACODYL 10 MG RE SUPP: 10.0000 mg | Freq: Every day | RECTAL | Status: DC | PRN

## 2017-08-31 MED ORDER — MIDAZOLAM HCL 2 MG/2ML IJ SOLN
INTRAMUSCULAR | Status: AC
  Filled 2017-08-31: qty 2

## 2017-08-31 MED ORDER — ONDANSETRON HCL 4 MG/2ML IJ SOLN: 4.0000 mg | Freq: Once | INTRAMUSCULAR | Status: DC | PRN

## 2017-08-31 MED ORDER — ONDANSETRON HCL 4 MG PO TABS: 4.0000 mg | ORAL_TABLET | Freq: Four times a day (QID) | ORAL | Status: DC | PRN

## 2017-08-31 MED ORDER — PHENYLEPHRINE HCL 10 MG/ML IJ SOLN
INTRAVENOUS | Status: DC | PRN
  Administered 2017-08-31: 25 ug/min via INTRAVENOUS

## 2017-08-31 MED ORDER — LIDOCAINE HCL (CARDIAC) 20 MG/ML IV SOLN
INTRAVENOUS | Status: DC | PRN
  Administered 2017-08-31: 60 mg via INTRAVENOUS
  Administered 2017-08-31: 40 mg via INTRAVENOUS

## 2017-08-31 MED ORDER — BUPIVACAINE HCL (PF) 0.5 % IJ SOLN
INTRAMUSCULAR | Status: AC
  Filled 2017-08-31: qty 30

## 2017-08-31 MED ORDER — LACTATED RINGERS IV SOLN
INTRAVENOUS | Status: DC | PRN
Start: 2017-08-31 — End: 2017-08-31
  Administered 2017-08-31 (×2): via INTRAVENOUS

## 2017-08-31 MED ORDER — BUPIVACAINE HCL (PF) 0.5 % IJ SOLN
INTRAMUSCULAR | Status: DC | PRN
  Administered 2017-08-31: 12 mL

## 2017-08-31 MED ORDER — FENTANYL CITRATE (PF) 250 MCG/5ML IJ SOLN
INTRAMUSCULAR | Status: AC
  Filled 2017-08-31: qty 5

## 2017-08-31 MED ORDER — HYDROCODONE-ACETAMINOPHEN 5-325 MG PO TABS
1.0000 | ORAL_TABLET | ORAL | Status: DC | PRN
  Administered 2017-08-31 – 2017-09-01 (×3): 2 via ORAL
  Filled 2017-08-31 (×3): qty 2

## 2017-08-31 MED ORDER — ACETAMINOPHEN 650 MG RE SUPP: 650.0000 mg | RECTAL | Status: DC | PRN

## 2017-08-31 MED ORDER — MAGNESIUM HYDROXIDE 400 MG/5ML PO SUSP: 30.0000 mL | Freq: Every day | ORAL | Status: DC | PRN

## 2017-08-31 MED ORDER — PHENYLEPHRINE 40 MCG/ML (10ML) SYRINGE FOR IV PUSH (FOR BLOOD PRESSURE SUPPORT)
PREFILLED_SYRINGE | INTRAVENOUS | Status: DC | PRN
  Administered 2017-08-31: 80 ug via INTRAVENOUS

## 2017-08-31 MED ORDER — SUGAMMADEX SODIUM 200 MG/2ML IV SOLN
INTRAVENOUS | Status: AC
  Filled 2017-08-31: qty 2

## 2017-08-31 MED ORDER — ROCURONIUM BROMIDE 10 MG/ML (PF) SYRINGE
PREFILLED_SYRINGE | INTRAVENOUS | Status: AC
  Filled 2017-08-31: qty 5

## 2017-08-31 MED ORDER — MENTHOL 3 MG MT LOZG: 1.0000 | LOZENGE | OROMUCOSAL | Status: DC | PRN

## 2017-08-31 MED ORDER — FENTANYL CITRATE (PF) 100 MCG/2ML IJ SOLN
INTRAMUSCULAR | Status: DC | PRN
  Administered 2017-08-31: 100 ug via INTRAVENOUS
  Administered 2017-08-31 (×3): 50 ug via INTRAVENOUS

## 2017-08-31 MED ORDER — SUGAMMADEX SODIUM 200 MG/2ML IV SOLN
INTRAVENOUS | Status: DC | PRN
  Administered 2017-08-31: 200 mg via INTRAVENOUS

## 2017-08-31 MED ORDER — DEXAMETHASONE SODIUM PHOSPHATE 10 MG/ML IJ SOLN
INTRAMUSCULAR | Status: DC | PRN
  Administered 2017-08-31: 10 mg via INTRAVENOUS

## 2017-08-31 MED ORDER — THROMBIN (RECOMBINANT) 20000 UNITS EX SOLR
CUTANEOUS | Status: DC | PRN
  Administered 2017-08-31: 09:00:00 via TOPICAL

## 2017-08-31 MED ORDER — KETOROLAC TROMETHAMINE 30 MG/ML IJ SOLN
30.0000 mg | Freq: Once | INTRAMUSCULAR | Status: AC
  Administered 2017-08-31: 30 mg via INTRAVENOUS

## 2017-08-31 MED ORDER — MIDAZOLAM HCL 5 MG/5ML IJ SOLN
INTRAMUSCULAR | Status: DC | PRN
  Administered 2017-08-31: 2 mg via INTRAVENOUS

## 2017-08-31 MED ORDER — DEXAMETHASONE SODIUM PHOSPHATE 10 MG/ML IJ SOLN
INTRAMUSCULAR | Status: AC
  Filled 2017-08-31: qty 1

## 2017-08-31 MED ORDER — PHENYLEPHRINE 40 MCG/ML (10ML) SYRINGE FOR IV PUSH (FOR BLOOD PRESSURE SUPPORT)
PREFILLED_SYRINGE | INTRAVENOUS | Status: AC
  Filled 2017-08-31: qty 10

## 2017-08-31 MED ORDER — LEVOTHYROXINE SODIUM 88 MCG PO TABS
88.0000 ug | ORAL_TABLET | Freq: Every day | ORAL | Status: DC
  Administered 2017-09-01: 88 ug via ORAL
  Filled 2017-08-31: qty 1

## 2017-08-31 MED ORDER — LIDOCAINE-EPINEPHRINE 1 %-1:100000 IJ SOLN
INTRAMUSCULAR | Status: DC | PRN
  Administered 2017-08-31: 12 mL via INTRADERMAL

## 2017-08-31 MED ORDER — 0.9 % SODIUM CHLORIDE (POUR BTL) OPTIME
TOPICAL | Status: DC | PRN
  Administered 2017-08-31: 1000 mL

## 2017-08-31 MED ORDER — HYDROXYZINE HCL 25 MG PO TABS: 50.0000 mg | ORAL_TABLET | ORAL | Status: DC | PRN

## 2017-08-31 SURGICAL SUPPLY — 72 items
BAG DECANTER FOR FLEXI CONT (MISCELLANEOUS) ×4 IMPLANT
BLADE CLIPPER SURG (BLADE) IMPLANT
BUR ACRON 5.0MM COATED (BURR) ×2 IMPLANT
BUR MATCHSTICK NEURO 3.0 LAGG (BURR) ×2 IMPLANT
CANISTER SUCT 3000ML PPV (MISCELLANEOUS) ×2 IMPLANT
CAP LCK SPNE (Orthopedic Implant) ×4 IMPLANT
CAP LOCK SPINE RADIUS (Orthopedic Implant) ×4 IMPLANT
CAP LOCKING (Orthopedic Implant) ×4 IMPLANT
CARTRIDGE OIL MAESTRO DRILL (MISCELLANEOUS) ×1 IMPLANT
CONT SPEC 4OZ CLIKSEAL STRL BL (MISCELLANEOUS) ×2 IMPLANT
COVER BACK TABLE 60X90IN (DRAPES) ×2 IMPLANT
DERMABOND ADVANCED (GAUZE/BANDAGES/DRESSINGS) ×1
DERMABOND ADVANCED .7 DNX12 (GAUZE/BANDAGES/DRESSINGS) ×1 IMPLANT
DIFFUSER DRILL AIR PNEUMATIC (MISCELLANEOUS) ×2 IMPLANT
DRAPE C-ARM 42X72 X-RAY (DRAPES) ×2 IMPLANT
DRAPE C-ARMOR (DRAPES) ×2 IMPLANT
DRAPE HALF SHEET 40X57 (DRAPES) IMPLANT
DRAPE LAPAROTOMY 100X72X124 (DRAPES) ×2 IMPLANT
DRAPE POUCH INSTRU U-SHP 10X18 (DRAPES) ×2 IMPLANT
ELECT REM PT RETURN 9FT ADLT (ELECTROSURGICAL) ×2
ELECTRODE REM PT RTRN 9FT ADLT (ELECTROSURGICAL) ×1 IMPLANT
GAUZE SPONGE 4X4 12PLY STRL (GAUZE/BANDAGES/DRESSINGS) ×2 IMPLANT
GAUZE SPONGE 4X4 16PLY XRAY LF (GAUZE/BANDAGES/DRESSINGS) ×2 IMPLANT
GLOVE BIOGEL PI IND STRL 6.5 (GLOVE) ×2 IMPLANT
GLOVE BIOGEL PI IND STRL 8 (GLOVE) ×2 IMPLANT
GLOVE BIOGEL PI INDICATOR 6.5 (GLOVE) ×2
GLOVE BIOGEL PI INDICATOR 8 (GLOVE) ×2
GLOVE ECLIPSE 7.5 STRL STRAW (GLOVE) ×4 IMPLANT
GLOVE SURG SS PI 6.5 STRL IVOR (GLOVE) ×6 IMPLANT
GOWN STRL REUS W/ TWL LRG LVL3 (GOWN DISPOSABLE) ×2 IMPLANT
GOWN STRL REUS W/ TWL XL LVL3 (GOWN DISPOSABLE) ×2 IMPLANT
GOWN STRL REUS W/TWL 2XL LVL3 (GOWN DISPOSABLE) IMPLANT
GOWN STRL REUS W/TWL LRG LVL3 (GOWN DISPOSABLE) ×2
GOWN STRL REUS W/TWL XL LVL3 (GOWN DISPOSABLE) ×2
HEMOSTAT POWDER SURGIFOAM 1G (HEMOSTASIS) ×2 IMPLANT
KIT BASIN OR (CUSTOM PROCEDURE TRAY) ×2 IMPLANT
KIT INFUSE X SMALL 1.4CC (Orthopedic Implant) ×2 IMPLANT
KIT ROOM TURNOVER OR (KITS) ×2 IMPLANT
MILL MEDIUM DISP (BLADE) ×2 IMPLANT
NEEDLE ASP BONE MRW 8GX15 (NEEDLE) ×2 IMPLANT
NEEDLE HYPO 18GX1.5 BLUNT FILL (NEEDLE) ×2 IMPLANT
NEEDLE SPNL 18GX3.5 QUINCKE PK (NEEDLE) ×2 IMPLANT
NEEDLE SPNL 22GX3.5 QUINCKE BK (NEEDLE) IMPLANT
NS IRRIG 1000ML POUR BTL (IV SOLUTION) ×2 IMPLANT
OIL CARTRIDGE MAESTRO DRILL (MISCELLANEOUS) ×2
PACK LAMINECTOMY NEURO (CUSTOM PROCEDURE TRAY) ×2 IMPLANT
PAD ARMBOARD 7.5X6 YLW CONV (MISCELLANEOUS) ×6 IMPLANT
PATTIES SURGICAL .5 X.5 (GAUZE/BANDAGES/DRESSINGS) ×2 IMPLANT
PATTIES SURGICAL .5 X1 (DISPOSABLE) ×2 IMPLANT
PATTIES SURGICAL 1X1 (DISPOSABLE) ×2 IMPLANT
ROD RADIUS 40MM (Neuro Prosthesis/Implant) ×1 IMPLANT
ROD RADIUS 45MM (Rod) ×1 IMPLANT
ROD SPNL 40X5.5XNS TI RDS (Neuro Prosthesis/Implant) ×1 IMPLANT
ROD SPNL 45X5.5XNS TI RDS (Rod) ×1 IMPLANT
SCREW 5.75X45MM (Screw) ×8 IMPLANT
SPACER SPINAL 8X25X4 8D PEEK (Spacer) ×2 IMPLANT
SPACER SPINAL 9X25X4-8D PEEK (Spacer) ×2 IMPLANT
SPONGE LAP 4X18 X RAY DECT (DISPOSABLE) IMPLANT
SPONGE NEURO XRAY DETECT 1X3 (DISPOSABLE) IMPLANT
SPONGE SURGIFOAM ABS GEL 100 (HEMOSTASIS) ×2 IMPLANT
STRIP BIOACTIVE VITOSS 25X100X (Neuro Prosthesis/Implant) ×2 IMPLANT
STRIP BIOACTIVE VITOSS 25X52X4 (Orthopedic Implant) ×2 IMPLANT
SUT VIC AB 1 CT1 18XBRD ANBCTR (SUTURE) ×2 IMPLANT
SUT VIC AB 1 CT1 8-18 (SUTURE) ×2
SUT VIC AB 2-0 CP2 18 (SUTURE) ×6 IMPLANT
SYR 3ML LL SCALE MARK (SYRINGE) ×2 IMPLANT
SYR CONTROL 10ML LL (SYRINGE) ×2 IMPLANT
TAPE CLOTH SURG 4X10 WHT LF (GAUZE/BANDAGES/DRESSINGS) ×2 IMPLANT
TOWEL GREEN STERILE (TOWEL DISPOSABLE) ×2 IMPLANT
TOWEL GREEN STERILE FF (TOWEL DISPOSABLE) ×2 IMPLANT
TRAY FOLEY W/METER SILVER 16FR (SET/KITS/TRAYS/PACK) ×2 IMPLANT
WATER STERILE IRR 1000ML POUR (IV SOLUTION) ×2 IMPLANT

## 2017-08-31 NOTE — Op Note (Signed)
08/31/2017  12:17 PM  PATIENT:  Theresa [name]  61 y.o. female  PRE-OPERATIVE DIAGNOSIS:  Lumbar stenosis with neurogenic claudication, lumbar spondylosis, lumbar degenerative disease, thoracolumbar degenerative scoliosis, lumbar radiculopathy  POST-OPERATIVE DIAGNOSIS:  Lumbar stenosis with neurogenic claudication, lumbar spondylosis, lumbar degenerative disease, thoracolumbar degenerative scoliosis, lumbar radiculopathy  PROCEDURE:  Procedure(s):  Bilateral L3-4 lumbar decompression including laminectomy, facetectomy, and foraminotomies for decompression of central canal, lateral recess, and neural foraminal stenosis, with decompression of the stenotic compression of the exiting L3 and L4 nerve roots bilaterally, with decompression beyond that required for interbody arthrodesis; bilateral L3-4 posterior lumbar interbody arthrodesis with AVS peek interbody implants, Vitoss BA with bone marrow aspirate, and infuse; bilateral L3-4 posterior lateral arthrodesis with nonsegmental radius posterior instrumentation, Vitoss BA with bone marrow aspirate, and infuse  SURGEON:  Shirlean Kellyobert Daun Rens, M.D.  ASSISTANTS: Marikay Alaravid Jones, M.D.  ANESTHESIA:   general  EBL:  Total I/O In: 1000 [I.V.:1000] Out: 450 [Urine:250; Blood:200]  BLOOD ADMINISTERED:none  CELL SAVER GIVEN:  Cell Saver technician felt that there was insufficient blood loss or process to collect the blood  COUNT: Correct per nursing staff  DICTATION: Patient is brought to the operating room placed under general endotracheal anesthesia. The patient was turned to prone position the lumbar region was prepped with Betadine soap and solution and draped in a sterile fashion. The midline was infiltrated with local anesthesia with epinephrine. A localizing x-ray was taken and then a midline incision was made carried down through the subcutaneous tissue, bipolar cautery and electrocautery were used to maintain hemostasis. Dissection was carried down  to the lumbar fascia. The fascia was incised bilaterally and the paraspinal muscles were dissected with a spinous process and lamina in a subperiosteal fashion. Another x-ray was taken for localization and the L3-4 level was localized. Dissection was then carried out laterally over the facet complex and the transverse processes of L3 and L4 were exposed and decorticated.   Decompression was begun with bilateral L3-4 lumbar laminectomy using the high-speed drill and Kerrison punches. Dissection was carried out laterally including facetectomy and foraminotomies with decompression of the stenotic compression of the exiting L3 and L4 nerve roots. Once the decompression stenotic compression of the thecal sac and exiting nerve roots was completed we proceeded with the posterior lumbar interbody arthrodesis. The annulus was incised bilaterally and the disc space entered. The disc space was markedly narrowed, but worse on the left than the right side. Care was taken to perform a thorough discectomy using pituitary rongeurs and curettes. We were able to progressively decompressing the neural foramina, with particular attention paid to the left L3-4 neural foramen. Once the discectomy was completed we began to prepare the endplate surfaces removing the cartilaginous endplates surface. We then measured the height of the intervertebral disc space. We selected an 8 x 25 x 4 AVS peek interbody implant for the left side and a 9 x 25 x 4 AVS peek interbody implant for the right side.  The C-arm fluoroscope was then draped and brought in the field and we identified the pedicle entry points bilaterally at the L3 and L4 levels. Each of the 4 pedicles was probed, we aspirated bone marrow aspirate from the vertebral bodies, this was injected over a 10 cc and a 5 cc strip of Vitoss BA. Then each of the pedicles was examined with the ball probe good bony surfaces were found and no bony cuts were found. Each of the pedicles was then  tapped with a 5.25  mm tap, again examined with the ball probe good threading was found and no bony cuts were found. We then placed 5.75 by 45 millimeter screws bilaterally at each level.  We then packed the AVS peek interbody implants with Vitoss BA with bone marrow aspirate and infuse, and then placed the first implant and on the right side, carefully retracting the thecal sac and nerve root medially. We then went back to the left side and packed the midline with additional Vitoss BA with bone marrow aspirate and infuse, and then placed a second implant and on the left side again retracting the thecal sac and nerve root medially. Additional Vitoss BA with bone marrow aspirate and infuse was packed lateral to each of the implants.  We then packed the lateral gutter over the transverse processes and intertransverse space with Vitoss BA with bone marrow aspirate and infuse. We then selected pre-lordosed rods. We used a 40 mm rod on the left and a 45 mm rod on the right. Each of the rods were placed within the screw heads and secured with locking caps, once all 4 locking caps were placed final tightening was performed against a counter torque.  The wound had been irrigated multiple times during the procedure with saline solution and bacitracin solution, good hemostasis was established with a combination of bipolar cautery, Gelfoam with thrombin, and Surgifoam. Once good hemostasis was confirmed we instilled 2 mL of fentanyl in the epidural space. We then proceeded with closure.  The deep fascia was closed with interrupted undyed 1 Vicryl sutures, the subcutaneous and subcuticular layer was  closed with interrupted inverted 2-0 undyed Vicryl sutures the skin edges were approximated with Dermabond. The wound was dressed with sterile gauze and Hypafix.  Following surgery the patient was turned back to the supine position to be reversed and the anesthetic extubated and transferred to the recovery room for further  care.  PLAN OF CARE: Admit to inpatient   PATIENT DISPOSITION:  PACU - hemodynamically stable.   Delay start of Pharmacological VTE agent (>24hrs) due to surgical blood loss or risk of bleeding:  yes

## 2017-08-31 NOTE — Anesthesia Preprocedure Evaluation (Addendum)
Anesthesia Evaluation  Patient identified by MRN, date of birth, ID band Patient awake    Reviewed: Allergy & Precautions, NPO status , Patient's Chart, lab work & pertinent test results  Airway Mallampati: I  TM Distance: >3 FB Neck ROM: Full    Dental  (+) Teeth Intact, Dental Advisory Given,    Pulmonary    breath sounds clear to auscultation       Cardiovascular  Rhythm:Regular Rate:Normal     Neuro/Psych    GI/Hepatic   Endo/Other    Renal/GU      Musculoskeletal   Abdominal   Peds  Hematology   Anesthesia Other Findings   Reproductive/Obstetrics                           Anesthesia Physical Anesthesia Plan  ASA: II  Anesthesia Plan: General   Post-op Pain Management:    Induction: Intravenous  PONV Risk Score and Plan: 1 and Ondansetron, Dexamethasone and Midazolam  Airway Management Planned: Oral ETT  Additional Equipment:   Intra-op Plan:   Post-operative Plan: Extubation in OR  Informed Consent: I have reviewed the patients History and Physical, chart, labs and discussed the procedure including the risks, benefits and alternatives for the proposed anesthesia with the patient or authorized representative who has indicated his/her understanding and acceptance.   Dental advisory given  Plan Discussed with: CRNA and Anesthesiologist  Anesthesia Plan Comments:        Anesthesia Quick Evaluation

## 2017-08-31 NOTE — H&P (Signed)
Subjective: Patient is a 61 y.o. right handed white female who is admitted for treatment of advanced degeneration and deformity with stenosis at the L3-4 level. Patient has advanced degenerative disease and spondylosis with asymmetric disc space collapse, resulting in a scoliotic curvature and marked canal stenosis with severe left L3-4 lateral recess and neural foraminal stenosis. She suffers with low back and left lumbar radicular pain. She's been treated with NSAIDs and spinal injections without relief, is admitted now for a L3-4 decompression and arthrodesis including an L3 and L4 decompressive lumbar laminectomy, complete left L3-4 facetectomy and partial right L3-4 facetectomy and bilateral L3-4 posterior lumbar interbody arthrodesis with interbody implants and bone graft and a bilateral L3-4 posterior lateral arthrodesis with posterior instrumentation and bone graft.    Patient Active Problem List   Diagnosis Date Noted  . Hematochezia 03/31/2017  . Congenital heart valve abnormality 03/15/2017  . Diastolic murmur 02/06/2017  . Insomnia secondary to anxiety 01/31/2016  . S/P TAH (total abdominal hysterectomy) 01/30/2016  . Screening for hyperlipidemia 01/13/2014  . Osteopenia 08/08/2013  . Glaucoma (increased eye pressure) 01/07/2013  . Visit for preventive health examination 01/07/2013  . Thyromegaly 12/16/2012  . 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 Screening for cervical cancer 09/18/2011  . Special screening for malignant neoplasms, colon 09/18/2011  . Breast screening, unspecified 09/18/2011  . Hypothyroidism 09/18/2011   Past Medical History:  Diagnosis Date  . Breast screening, unspecified   . H/O: Bell's palsy   . Headache   . Heart murmur   . History of kidney stones   . Hypercalcemia   . Osteoporosis   . Screening for malignant neoplasm of the cervix   . Special screening for malignant neoplasms, colon   . Unspecified hypothyroidism    thyroid nodule  .  Unspecified otitis media     Past Surgical History:  Procedure Laterality Date  . ANTERIOR CERVICAL DECOMP/DISCECTOMY FUSION  13,15   C3 ,4 ,5 [facility]  . ANTERIOR FUSION CERVICAL SPINE  2013   [facility]  c5-c6  . CHOLECYSTECTOMY  1989  . dental implant    . MENISCUS REPAIR Right 01/2010   [facility]  . PARTIAL HYSTERECTOMY     secondary to fibroids  . SPINE SURGERY     [facility], C3, 4 5    Medications Prior to Admission  Medication Sig Dispense Refill Last Dose  . acetaminophen (TYLENOL) 500 MG tablet Take 1,000 mg by mouth every 6 (six) hours as needed for moderate pain or headache.   Past Month at Unknown time  . ALPRAZolam (XANAX) 0.25 MG tablet TAKE 1 TABLET BY MOUTH TWICE DAILY AS NEEDED FOR ANXIETY (Patient taking differently: TAKE 0.25 MG BY MOUTH TWICE DAILY AS NEEDED FOR ANXIETY) 30 tablet 3 08/30/2017 at Unknown time  . Biotin 16109 MCG TABS Take 10,000 mcg by mouth daily.    Past Month at Unknown time  . Cholecalciferol (VITAMIN D3) 1000 units CAPS Take 1,000 Units by mouth daily.    Past Week at Unknown time  . levothyroxine (SYNTHROID, LEVOTHROID) 88 MCG tablet TAKE ONE TABLET BY MOUTH ONCE DAILY (Patient taking differently: TAKE 88 MCG BY MOUTH ONCE DAILY) 90 tablet 3 08/31/2017 at 0500  . raloxifene (EVISTA) 60 MG tablet TAKE ONE TABLET BY MOUTH ONCE DAILY (Patient taking differently: TAKE 60 MG BY MOUTH ONCE DAILY) 30 tablet 5 07/25/2017   Allergies  Allergen Reactions  . Meloxicam Other (See Comments)    Dizziness  . Topamax [Topiramate] Other (See Comments)  Caused hairloss  . Adhesive [Tape] Itching, Rash and Other (See Comments)    Regular Band-Aid are okay  One time had itching with tape  . Tramadol Itching and Other (See Comments)    Recently developed    Social History   Tobacco Use  . Smoking status: Never Smoker  . Smokeless tobacco: Never Used  Substance Use Topics  . Alcohol use: Yes    Alcohol/week: 2.5 oz    Types: 5 Standard drinks or equivalent per  week    Comment: Occasional    Family History  Problem Relation Age of Onset  . Thyroid nodules Mother   . Alzheimer's disease Mother   . Hyperlipidemia Father   . Cancer Paternal Aunt [phone]       breast cancer  . Thyroid nodules Sister   . Cancer Sister [phone]       in situ  . Breast cancer Sister [phone]  . Thyroid nodules Maternal Grandmother      Review of Systems A comprehensive review of systems was negative.  Objective: Vital signs in last 24 hours: Temp:  [97.8 F (36.6 C)] 97.8 F (36.6 C) (12/19 0713) Pulse Rate:  [62] 62 (12/19 0713) Resp:  [18] 18 (12/19 0713) BP: (119)/(40) 119/40 (12/19 0713) SpO2:  [100 %] 100 % (12/19 0713) Weight:  [64.1 kg (141 lb 6.4 oz)] 64.1 kg (141 lb 6.4 oz) (12/19 0727)  EXAM: Patient well-developed well-nourished white female in no acute distress. Lungs are clear to auscultation , the patient has symmetrical respiratory excursion. Heart has a regular rate and rhythm normal S1 and S2 no murmur.   Abdomen is soft nontender nondistended bowel sounds are present. Extremity examination shows no clubbing cyanosis or edema. Motor examination shows 5 over 5 strength in the lower extremities including the iliopsoas quadriceps dorsiflexor extensor hallicus  longus and plantar flexor bilaterally. Sensation is intact to pinprick in the distal lower extremities. Reflexes are symmetrical bilaterally. No pathologic reflexes are present. Patient has a normal gait and stance.   Data Review:CBC    Component Value Date/Time   WBC 5.9 08/26/2017 1024   RBC 4.45 08/26/2017 1024   HGB 13.8 08/26/2017 1024   HCT 41.4 08/26/2017 1024   PLT 247 08/26/2017 1024   MCV 93.0 08/26/2017 1024   MCH 31.0 08/26/2017 1024   MCHC 33.3 08/26/2017 1024   RDW 13.3 08/26/2017 1024   LYMPHSABS 1,216 01/28/2017 0900   MONOABS 704 01/28/2017 0900   EOSABS 320 01/28/2017 0900   BASOSABS 64 01/28/2017 0900                          BMET    Component Value Date/Time   NA 138  08/26/2017 1024   K 4.3 08/26/2017 1024   CL 104 08/26/2017 1024   CO2 28 08/26/2017 1024   GLUCOSE 94 08/26/2017 1024   BUN 12 08/26/2017 1024   CREATININE 0.75 08/26/2017 1024   CREATININE 0.76 07/15/2017 1049   CALCIUM 9.3 08/26/2017 1024   GFRNONAA >60 08/26/2017 1024   GFRAA >60 08/26/2017 1024     Assessment/Plan: Patient with advanced degeneration at the L3-4 level with significant canal and left L3-4 lateral recess and neural foraminal stenosis, and is admitted now for an L3-4 decompression and arthrodesis.  I've discussed with the patient the nature of his condition, the nature the surgical procedure, the typical length of surgery, hospital stay, and overall recuperation, the limitations postoperatively, and risks  of surgery. I discussed risks including risks of infection, bleeding, possibly need for transfusion, the risk of nerve root dysfunction with pain, weakness, numbness, or paresthesias, the risk of dural tear and CSF leakage and possible need for further surgery, the risk of failure of the arthrodesis and possibly for further surgery, the risk of anesthetic complications including myocardial infarction, stroke, pneumonia, and death. We discussed the need for postoperative immobilization in a lumbar brace. Understanding all this the patient does wish to proceed with surgery and is admitted for such.     Hewitt ShortsNUDELMAN,ROBERT W, MD 08/31/2017 8:20 AM

## 2017-08-31 NOTE — Progress Notes (Signed)
Vitals:   08/31/17 1330 08/31/17 1345 08/31/17 1400 08/31/17 1431  BP: (!) 104/56 (!) 98/48 (!) 105/49 (!) 101/51  Pulse: 72 66 62 (!) 58  Resp: 15 17 12 18   Temp:   98.6 F (37 C) 98.6 F (37 C)  TempSrc:      SpO2: 99% 92% 96% 97%  Weight:      Height:        Patient resting in bed. Has been up and ambulating in the halls. Dressing clean and dry. Foley DC'd, patient has not yet voided. Incisional discomfort manageable. Left lumbar radicular pain improved.  Plan: Doing well following surgery. Encouraged and divided to 3 more times this evening. We'll continue to progress through postoperative recovery.  Hewitt ShortsNUDELMAN,ROBERT W, MD 08/31/2017, 5:12 PM

## 2017-08-31 NOTE — Plan of Care (Signed)
  Progressing Safety: Ability to remain free from injury will improve 08/31/2017 2043 - Progressing by Mel AlmondAguilar, Dossie Swor D, RN Activity: Ability to avoid complications of mobility impairment will improve 08/31/2017 2043 - Progressing by Mel AlmondAguilar, Margee Trentham D, RN Ability to tolerate increased activity will improve 08/31/2017 2043 - Progressing by Mel AlmondAguilar, Nikkol Pai D, RN Will remain free from falls 08/31/2017 2043 - Progressing by Threasa BeardsAguilar, Bradan Congrove D, RN Bowel/Gastric: Gastrointestinal status for postoperative course will improve 08/31/2017 2043 - Progressing by Mel AlmondAguilar, Sakai Heinle D, RN Education: Ability to verbalize activity precautions or restrictions will improve 08/31/2017 2043 - Progressing by Threasa BeardsAguilar, Alvy Alsop D, RN Knowledge of the prescribed therapeutic regimen will improve 08/31/2017 2043 - Progressing by Mel AlmondAguilar, Mekenna Finau D, RN Understanding of discharge needs will improve 08/31/2017 2043 - Progressing by Mel AlmondAguilar, Hannah Crill D, RN Physical Regulation: Ability to maintain clinical measurements within normal limits will improve 08/31/2017 2043 - Progressing by Mel AlmondAguilar, Matix Henshaw D, RN Postoperative complications will be avoided or minimized 08/31/2017 2043 - Progressing by Mel AlmondAguilar, Sosaia Pittinger D, RN Diagnostic test results will improve 08/31/2017 2043 - Progressing by Threasa BeardsAguilar, Vinie Charity D, RN Pain Management: Pain level will decrease 08/31/2017 2043 - Progressing by Mel AlmondAguilar, Bennye Nix D, RN Skin Integrity: Signs of wound healing will improve 08/31/2017 2043 - Progressing by Threasa BeardsAguilar, Zalan Shidler D, RN Health Behavior/Discharge Planning: Identification of resources available to assist in meeting health care needs will improve 08/31/2017 2043 - Progressing by Threasa BeardsAguilar, Lem Peary D, RN Bladder/Genitourinary: Urinary functional status for postoperative course will improve 08/31/2017 2043 - Progressing by Mel AlmondAguilar, Whittany Parish D, RN

## 2017-08-31 NOTE — Anesthesia Postprocedure Evaluation (Signed)
Anesthesia Post Note  Patient: Theresa [name]  Procedure(s) Performed: Lumbar Three-Lumbar Four Lumbar decompression, Posterior Lumbar Interbody Fusion, Posterior Lateral Athrodesis (N/A Back)     Patient location during evaluation: PACU Anesthesia Type: General Level of consciousness: awake, awake and alert and oriented Pain management: pain level controlled Vital Signs Assessment: post-procedure vital signs reviewed and stable Respiratory status: spontaneous breathing, nonlabored ventilation and respiratory function stable Cardiovascular status: blood pressure returned to baseline Anesthetic complications: no    Last Vitals:  Vitals:   08/31/17 1431 08/31/17 2015  BP: (!) 101/51 (!) 95/53  Pulse: (!) 58 66  Resp: 18 18  Temp: 37 C 37.1 C  SpO2: 97% 98%    Last Pain:  Vitals:   08/31/17 2015  TempSrc: Oral  PainSc: 3                  [name]

## 2017-08-31 NOTE — Anesthesia Procedure Notes (Addendum)
Procedure Name: Intubation Date/Time: 08/31/2017 8:37 AM Performed by: Kyung Rudd, CRNA Pre-anesthesia Checklist: Patient identified, Emergency Drugs available, Suction available and Patient being monitored Patient Re-evaluated:Patient Re-evaluated prior to induction Oxygen Delivery Method: Circle system utilized Preoxygenation: Pre-oxygenation with 100% oxygen Induction Type: IV induction Ventilation: Mask ventilation without difficulty Laryngoscope Size: Mac and 3 Grade View: Grade I Tube type: Oral Tube size: 7.0 mm Number of attempts: 1 Airway Equipment and Method: LTA kit utilized and Stylet Placement Confirmation: ETT inserted through vocal cords under direct vision,  positive ETCO2 and breath sounds checked- equal and bilateral Secured at: 21 cm Tube secured with: Tape Dental Injury: Teeth and Oropharynx as per pre-operative assessment

## 2017-08-31 NOTE — Transfer of Care (Signed)
Immediate Anesthesia Transfer of Care Note  Patient: Theresa [name]  Procedure(s) Performed: Lumbar Three-Lumbar Four Lumbar decompression, Posterior Lumbar Interbody Fusion, Posterior Lateral Athrodesis (N/A Back)  Patient Location: PACU  Anesthesia Type:General  Level of Consciousness: awake, alert  and oriented  Airway & Oxygen Therapy: Patient Spontanous Breathing and Patient connected to nasal cannula oxygen  Post-op Assessment: Report given to RN, Post -op Vital signs reviewed and stable and Patient moving all extremities X 4  Post vital signs: Reviewed and stable  Last Vitals:  Vitals:   08/31/17 0713  BP: (!) 119/40  Pulse: 62  Resp: 18  Temp: 36.6 C  SpO2: 100%    Last Pain:  Vitals:   08/31/17 0726  TempSrc:   PainSc: 7       Patients Stated Pain Goal: 5 (08/31/17 0726)  Complications: No apparent anesthesia complications

## 2017-08-31 NOTE — Progress Notes (Signed)
Report given to jada rn as caregiver 

## 2017-09-01 ENCOUNTER — Telehealth: Payer: Self-pay | Admitting: Cardiovascular Disease

## 2017-09-01 MED ORDER — HYDROCODONE-ACETAMINOPHEN 5-325 MG PO TABS: 1.0000 | ORAL_TABLET | ORAL | 0 refills | Status: DC | PRN

## 2017-09-01 MED ORDER — CYCLOBENZAPRINE HCL 10 MG PO TABS: 5.0000 mg | ORAL_TABLET | Freq: Three times a day (TID) | ORAL | 0 refills | Status: DC | PRN

## 2017-09-01 MED ORDER — CYCLOBENZAPRINE HCL 10 MG PO TABS: 10.0000 mg | ORAL_TABLET | Freq: Three times a day (TID) | ORAL | Status: DC | PRN

## 2017-09-01 NOTE — Progress Notes (Signed)
Pt doing well. Pt and husband given D/C instructions with Rx's, verbal understanding was provided. Pt's incision is clean and dry with no sign of infection. Pt's IV was removed prior to D/C. Pt D/C'd home via wheelchair @ 1020 per MD order. Pt is stable @ D/C and has no other needs at this time. Davyn Morandi, RN  

## 2017-09-01 NOTE — Discharge Summary (Signed)
Physician Discharge Summary  Patient ID: Theresa [name] MRN: [id number] DOB/AGE: [birth year] 61 y.o.  Admit date: 08/31/2017 Discharge date: 09/01/2017  Admission Diagnoses:  Lumbar stenosis with neurogenic claudication, lumbar spondylosis, lumbar degenerative disease, thoracolumbar degenerative scoliosis, lumbar radiculopathy  Discharge Diagnoses:  Lumbar stenosis with neurogenic claudication, lumbar spondylosis, lumbar degenerative disease, thoracolumbar degenerative scoliosis, lumbar radiculopathy  Active Problems:   Lumbar stenosis with neurogenic claudication   Discharged Condition: good  Hospital Course: Patient was admitted, underwent a bilateral L3-4 lumbar decompression, PLIF, and PLA. She is done well following surgery. She is up and living actively in the halls. She is voiding well. Her dressing was removed, and the incision is healing nicely. There is no erythema, swelling, or drainage. She is being discharged home with instructions regarding wound care and activities. She is scheduled to return for follow-up with me in about 3 weeks.  Discharge Exam: Blood pressure (!) 99/59, pulse 69, temperature 97.6 F (36.4 C), resp. rate 16, height 5\' 6"  (1.676 m), weight 64.1 kg (141 lb 6.4 oz), SpO2 97 %.  Disposition:  Home  Discharge Instructions    Discharge wound care:   Complete by:  As directed    Leave the wound open to air. Shower daily with the wound uncovered. Water and soapy water should run over the incision area. Do not wash directly on the incision for 2 weeks. Remove the glue after 2 weeks.   Driving Restrictions   Complete by:  As directed    No driving for 2 weeks. May ride in the car locally now. May begin to drive locally in 2 weeks.   Other Restrictions   Complete by:  As directed    Walk gradually increasing distances out in the fresh air at least twice a day. Walking additional 6 times inside the house, gradually increasing distances, daily. No bending,  lifting, or twisting. Perform activities between shoulder and waist height (that is at counter height when standing or table height when sitting).     Allergies as of 09/01/2017      Reactions   Meloxicam Other (See Comments)   Dizziness   Topamax [topiramate] Other (See Comments)   Caused hairloss   Adhesive [tape] Itching, Rash, Other (See Comments)   Regular Band-Aid are okay  One time had itching with tape   Tramadol Itching, Other (See Comments)   Recently developed      Medication List    TAKE these medications   acetaminophen 500 MG tablet Commonly known as:  TYLENOL Take 1,000 mg by mouth every 6 (six) hours as needed for moderate pain or headache.   ALPRAZolam 0.25 MG tablet Commonly known as:  XANAX TAKE 1 TABLET BY MOUTH TWICE DAILY AS NEEDED FOR ANXIETY What changed:    how much to take  how to take this  when to take this  additional instructions   Biotin 1308610000 MCG Tabs Take 10,000 mcg by mouth daily.   cyclobenzaprine 10 MG tablet Commonly known as:  FLEXERIL Take 0.5-1 tablets (5-10 mg total) by mouth 3 (three) times daily as needed for muscle spasms.   HYDROcodone-acetaminophen 5-325 MG tablet Commonly known as:  NORCO/VICODIN Take 1-2 tablets by mouth every 4 (four) hours as needed (pain).   levothyroxine 88 MCG tablet Commonly known as:  SYNTHROID, LEVOTHROID TAKE ONE TABLET BY MOUTH ONCE DAILY What changed:    how much to take  how to take this  when to take this   raloxifene 60 MG tablet  Commonly known as:  EVISTA TAKE ONE TABLET BY MOUTH ONCE DAILY What changed:    how much to take  how to take this  when to take this   Vitamin D3 1000 units Caps Take 1,000 Units by mouth daily.            Discharge Care Instructions  (From admission, onward)        Start     Ordered   09/01/17 0000  Discharge wound care:    Comments:  Leave the wound open to air. Shower daily with the wound uncovered. Water and soapy water should  run over the incision area. Do not wash directly on the incision for 2 weeks. Remove the glue after 2 weeks.   09/01/17 0932       Signed: Hewitt ShortsNUDELMAN,ROBERT W 09/01/2017, 9:32 AM

## 2017-09-01 NOTE — Telephone Encounter (Signed)
L mom to schedule Echocardiogram for January 2019

## 2017-09-01 NOTE — Discharge Instructions (Signed)

## 2017-09-08 MED FILL — Sodium Chloride IV Soln 0.9%: INTRAVENOUS | Qty: 1000 | Status: AC

## 2017-09-08 MED FILL — Heparin Sodium (Porcine) Inj 1000 Unit/ML: INTRAMUSCULAR | Qty: 10 | Status: CN

## 2017-09-08 MED FILL — Heparin Sodium (Porcine) Inj 1000 Unit/ML: INTRAMUSCULAR | Qty: 30 | Status: AC

## 2017-09-20 DIAGNOSIS — Z981 Arthrodesis status: Secondary | ICD-10-CM | POA: Insufficient documentation

## 2017-10-11 ENCOUNTER — Other Ambulatory Visit: Payer: Self-pay

## 2017-10-11 ENCOUNTER — Ambulatory Visit (INDEPENDENT_AMBULATORY_CARE_PROVIDER_SITE_OTHER): Payer: No Typology Code available for payment source

## 2017-10-11 DIAGNOSIS — I359 Nonrheumatic aortic valve disorder, unspecified: Secondary | ICD-10-CM

## 2017-10-14 DIAGNOSIS — Z981 Arthrodesis status: Secondary | ICD-10-CM | POA: Insufficient documentation

## 2017-10-26 DIAGNOSIS — G4486 Cervicogenic headache: Secondary | ICD-10-CM | POA: Insufficient documentation

## 2017-10-26 DIAGNOSIS — S32009K Unspecified fracture of unspecified lumbar vertebra, subsequent encounter for fracture with nonunion: Secondary | ICD-10-CM | POA: Insufficient documentation

## 2017-10-26 DIAGNOSIS — M778 Other enthesopathies, not elsewhere classified: Secondary | ICD-10-CM | POA: Insufficient documentation

## 2017-10-26 DIAGNOSIS — M7582 Other shoulder lesions, left shoulder: Secondary | ICD-10-CM | POA: Insufficient documentation

## 2017-11-08 ENCOUNTER — Telehealth: Payer: Self-pay | Admitting: Internal Medicine

## 2017-11-08 NOTE — Telephone Encounter (Signed)
Script called to pharmacy

## 2017-11-08 NOTE — Telephone Encounter (Signed)
Copied from CRM [id number]. Topic: Quick Communication - Rx Refill/Question >> Nov 08, 2017  9:13 AM Maia Pettiesrtiz, Kristie S wrote: Medication: levothyroxine - the manufactuer has changed and pharmacy needs approval from provider to fill with the new manufacturer - they faxed a request a few days ago but have not received a reply - pt is now out of meds - pt is requesting refills on RX  Has the patient contacted their pharmacy? Yes.   Preferred Pharmacy (with phone number or street name): [facility] [address] - [place], [place] - [id number] [phone] (Phone) [phone] (Fax)

## 2017-11-08 NOTE — Telephone Encounter (Signed)
Patient calling about her medicine refill on Lvothyroxine she states that the pharmacy sent over a request over the weekend and that she is out of her medicine she ran out this morning she would like to get a 30 Day refill on this medicine

## 2017-11-24 ENCOUNTER — Other Ambulatory Visit: Payer: Self-pay | Admitting: *Deleted

## 2017-11-24 DIAGNOSIS — Q248 Other specified congenital malformations of heart: Secondary | ICD-10-CM

## 2018-01-13 ENCOUNTER — Other Ambulatory Visit: Payer: Self-pay | Admitting: Internal Medicine

## 2018-02-04 DIAGNOSIS — W19XXXA Unspecified fall, initial encounter: Secondary | ICD-10-CM | POA: Insufficient documentation

## 2018-02-10 ENCOUNTER — Encounter: Payer: No Typology Code available for payment source | Admitting: Internal Medicine

## 2018-02-17 ENCOUNTER — Ambulatory Visit (INDEPENDENT_AMBULATORY_CARE_PROVIDER_SITE_OTHER): Payer: No Typology Code available for payment source

## 2018-02-17 ENCOUNTER — Encounter: Payer: Self-pay | Admitting: Internal Medicine

## 2018-02-17 ENCOUNTER — Ambulatory Visit (INDEPENDENT_AMBULATORY_CARE_PROVIDER_SITE_OTHER): Payer: No Typology Code available for payment source | Admitting: Internal Medicine

## 2018-02-17 VITALS — BP 120/68 | HR 70 | Temp 98.1°F | Resp 15 | Ht 66.0 in | Wt 143.6 lb

## 2018-02-17 DIAGNOSIS — M8589 Other specified disorders of bone density and structure, multiple sites: Secondary | ICD-10-CM | POA: Diagnosis not present

## 2018-02-17 DIAGNOSIS — F5105 Insomnia due to other mental disorder: Secondary | ICD-10-CM | POA: Diagnosis not present

## 2018-02-17 DIAGNOSIS — Z0001 Encounter for general adult medical examination with abnormal findings: Secondary | ICD-10-CM | POA: Diagnosis not present

## 2018-02-17 DIAGNOSIS — M48062 Spinal stenosis, lumbar region with neurogenic claudication: Secondary | ICD-10-CM | POA: Diagnosis not present

## 2018-02-17 DIAGNOSIS — Z1211 Encounter for screening for malignant neoplasm of colon: Secondary | ICD-10-CM | POA: Diagnosis not present

## 2018-02-17 DIAGNOSIS — I351 Nonrheumatic aortic (valve) insufficiency: Secondary | ICD-10-CM

## 2018-02-17 DIAGNOSIS — M545 Low back pain, unspecified: Secondary | ICD-10-CM

## 2018-02-17 DIAGNOSIS — F419 Anxiety disorder, unspecified: Secondary | ICD-10-CM

## 2018-02-17 DIAGNOSIS — Z23 Encounter for immunization: Secondary | ICD-10-CM | POA: Diagnosis not present

## 2018-02-17 DIAGNOSIS — E039 Hypothyroidism, unspecified: Secondary | ICD-10-CM | POA: Diagnosis not present

## 2018-02-17 DIAGNOSIS — Z90710 Acquired absence of both cervix and uterus: Secondary | ICD-10-CM

## 2018-02-17 HISTORY — DX: Nonrheumatic aortic (valve) insufficiency: I35.1

## 2018-02-17 MED ORDER — ZOSTER VAC RECOMB ADJUVANTED 50 MCG/0.5ML IM SUSR: 0.5000 mL | Freq: Once | INTRAMUSCULAR | 1 refills | Status: AC

## 2018-02-17 NOTE — Progress Notes (Signed)
Patient ID: Theresa [name], female    DOB: 11-11-1955  Age: 62 y.o. MRN: [id number]  The patient is here for annual  Preventive examination and management of other chronic and acute problems.  Last seen July 2018.  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  The roster of all physicians providing medical care to patient - is listed in the Snapshot section of the chart.  Activities of daily living:  The patient is 100% independent in all ADLs: dressing, toileting, feeding as well as independent mobility  Home safety : The patient has smoke detectors in the home. They wear seatbelts.  There are no firearms at home. There is no violence in the home.   There is no risks for hepatitis, STDs or HIV. There is no   history of blood transfusion. They have no travel history to infectious disease endemic areas of the world.  The patient has seen their dentist in the last six month. They have seen their eye doctor in the last year. They admit to slight hearing difficulty with regard to whispered voices and some television programs.  They have deferred audiologic testing in the last year.  They do not  have excessive sun exposure. Discussed the need for sun protection: hats, long sleeves and use of sunscreen if there is significant sun exposure.   Diet: the importance of a healthy diet is discussed. They do have a healthy diet.  The benefits of regular aerobic exercise were discussed. She walks 4 times per week ,  20 minutes.   Depression screen: there are no signs or vegative symptoms of depression- irritability, change in appetite, anhedonia, sadness/tearfullness.  Cognitive assessment: the patient manages all their financial and personal affairs and is actively engaged. They could  relate day,date,year and events; recalled 2/3 objects at 3 minutes; performed clock-face test normally.  The following portions of the patient's history were reviewed and updated as appropriate: allergies, current medications, past family history, past medical history,  past surgical history, past social history  and problem list.  Visual acuity was not assessed per patient preference since she has regular follow up with her ophthalmologist. Hearing and body mass index were assessed and reviewed.   During the course of the visit the patient was educated and counseled about appropriate screening and preventive services including : fall prevention , diabetes screening, nutrition counseling, colorectal cancer screening, and recommended immunizations.    CC: The primary encounter diagnosis was Encounter for general adult medical examination with abnormal findings. Diagnoses of Aortic valve insufficiency, etiology of cardiac valve disease unspecified, Acute left-sided low back pain without sciatica, Need for measles-mumps-rubella (MMR) vaccine, Acquired hypothyroidism, Osteopenia of multiple sites, S/P TAH (total abdominal hysterectomy), Lumbar stenosis with neurogenic claudication, Insomnia secondary to anxiety, and Special screening for malignant neoplasms, colon were also pertinent to this visit. Lumbar spine decompressive surgery  L3-L4   [facility]. Radiculopathy resolved , but now has neck pain (prior neck fusions) with bilateral arm numbness (occupational history significant,  She is a Copywriter, advertising) .   Developed shingles post operatively in Dec .  Was put on hold for 28 minutes without anyone picking up  Finally hung up  And went to [facility]  And  was treated with valtrex and keflex for secondary skin infection   osteopenia has progressed despite Evista. Taking 1000 ius D3     Had a fall off bench on May 25 landed hard on bottom . No bruising noted,  But was in considerable pain  for a few days.   [name] has a past medical history of Breast screening, unspecified, H/O: Bell's palsy, Headache, Heart murmur, History of kidney stones, Hypercalcemia, Osteoporosis, Screening for malignant neoplasm of the cervix, Special screening for malignant neoplasms, colon, Unspecified hypothyroidism, and Unspecified otitis media.   She has a past surgical history that includes Partial hysterectomy; Meniscus repair (Right, 01/2010); Cholecystectomy (1989); dental implant; Anterior fusion cervical spine (2013); Spine surgery; and Anterior cervical decomp/discectomy fusion (13,15).   Her family history includes Alzheimer's disease in her mother; Breast cancer (age of onset: 101) in her sister; Cancer (age of onset: 55) in her paternal aunt and sister; Hyperlipidemia in her father; Thyroid nodules in her maternal grandmother, mother, and sister.She reports that she has never smoked. She has never used smokeless tobacco. She reports that she drinks about 2.5 oz of alcohol per week. She reports that she does not use drugs.  Outpatient Medications Prior to Visit  Medication Sig Dispense Refill  . ALPRAZolam (XANAX) 0.25 MG tablet TAKE 1 TABLET BY MOUTH TWICE DAILY AS NEEDED FOR ANXIETY (Patient taking differently: TAKE 0.25 MG BY MOUTH TWICE DAILY AS NEEDED FOR ANXIETY) 30 tablet 3  . aspirin 500 MG tablet     . Biotin 10000 MCG TABS Take 10,000 mcg by mouth daily.     . Cholecalciferol (VITAMIN D3) 1000 units CAPS Take 1,000 Units by mouth daily.     . cyclobenzaprine (FLEXERIL) 10 MG tablet Take 0.5-1 tablets (5-10 mg total) by mouth 3 (three) times daily as needed for muscle spasms. 40 tablet 0  . levothyroxine (SYNTHROID, LEVOTHROID) 88 MCG tablet TAKE ONE TABLET BY MOUTH ONCE DAILY (Patient taking differently: TAKE 88 MCG BY MOUTH ONCE DAILY) 90 tablet 3  . raloxifene (EVISTA) 60 MG tablet TAKE 1 TABLET BY MOUTH ONCE DAILY 30 tablet 0  . acetaminophen (TYLENOL) 500 MG tablet Take  1,000 mg by mouth every 6 (six) hours as needed for moderate pain or headache.    [name] HYDROcodone-acetaminophen (NORCO/VICODIN) 5-325 MG tablet Take 1-2 tablets by mouth every 4 (four) hours as needed (pain). (Patient not taking: Reported on 02/17/2018) 40 tablet 0   No facility-administered medications prior to visit.     Review of Systems   Patient denies headache, fevers, malaise, unintentional weight loss, skin rash, eye pain, sinus congestion and sinus pain, sore throat, dysphagia,  hemoptysis , cough, dyspnea, wheezing, chest pain, palpitations, orthopnea, edema, abdominal pain, nausea, melena, diarrhea, constipation, flank pain, dysuria, hematuria, urinary  Frequency, nocturia, numbness, tingling, seizures,  Focal weakness, Loss of consciousness,  Tremor, insomnia, depression, anxiety, and suicidal ideation.      Objective:  BP 120/68 (BP Location: Left Arm, Patient Position: Sitting, Cuff Size: Normal)   Pulse 70   Temp 98.1 F (36.7 C) (Oral)   Resp 15   Ht 5' 6" (1.676 m)   Wt 143 lb 9.6 oz (65.1 kg)   SpO2 97%   BMI 23.18 kg/m   Physical Exam   General Appearance:    Alert, cooperative, no distress, appears stated age  Head:    Normocephalic, without obvious abnormality, atraumatic  Eyes:    PERRL, conjunctiva/corneas clear, EOM's intact, fundi    benign,  both eyes  Ears:    Normal TM's and external ear canals, both ears  Nose:   Nares normal, septum midline, mucosa normal, no drainage    or sinus tenderness  Throat:   Lips, mucosa, and tongue normal; teeth and gums normal  Neck:   Supple, symmetrical, trachea midline, no adenopathy;    thyroid:  no enlargement/tenderness/nodules; no carotid   bruit or JVD  Back:     Symmetric, no curvature, ROM normal, no CVA tenderness  Lungs:     Clear to auscultation bilaterally, respirations unlabored  Chest Wall:    No tenderness or deformity   Heart:    Regular rate and rhythm, S1 and S2 normal, no murmur, rub   or gallop   Breast Exam:    No tenderness, masses, or nipple abnormality  Abdomen:     Soft, non-tender, bowel sounds active all four quadrants,    no masses, no organomegaly  Genitalia:    Pelvic: cervix surgically absent, external genitalia normal, no adnexal masses or tenderness,  rectovaginal septum normal, uterus surgically  absent and vagina normal without discharge  Extremities:   Extremities normal, atraumatic, no cyanosis or edema  Pulses:   2+ and symmetric all extremities  Skin:   Skin color, texture, turgor normal, no rashes or lesions  Lymph nodes:   Cervical, supraclavicular, and axillary nodes normal  Neurologic:   CNII-XII intact, normal strength, sensation and reflexes    throughout      Assessment & Plan:   Problem List Items Addressed This Visit    Special screening for malignant neoplasms, colon    Awaiting report from August 2018 colonoscopy; reportedly normal,  10 yr follow up per patient.       S/P TAH (total abdominal hysterectomy)    Confirmed on exam today.  No PAP smear was done       Osteopenia    DEXA shows progression despite several years of raloxifene.  Discussed referral to Endocrinology       Relevant Orders   Ambulatory referral to Endocrinology   Lumbar stenosis with neurogenic claudication    Her pain and radiculopathy have resolved with decompressive surgery on L3-L4 .  Plain films of lumbar spine were done today to rule out hardware damage and compression fracture due to recent fall.       Relevant Medications   aspirin 500 MG tablet   Insomnia secondary to anxiety    Managed with prn alprazolam.  The risks and benefits of benzodiazepine use were reviewed with patient today including excessive sedation leading to respiratory depression,  impaired thinking/driving, and addiction.  Patient was advised to avoid concurrent use with alcohol, to use medication only as needed and not to share with others  .       Hypothyroidism    Thyroid function is  due  Lab Results  Component Value Date   TSH 1.23 07/15/2017         Relevant Orders   TSH   Encounter for general adult medical examination with abnormal findings - Primary    Annual comprehensive preventive exam was done as well as an evaluation and management of chronic conditions .  During the course of the visit the patient was educated and counseled about appropriate screening and preventive services including :  diabetes screening, lipid analysis with projected  10 year  risk for CAD , nutrition counseling, breast,  and colorectal cancer screening, and recommended immunizations.  Printed recommendations for health maintenance screenings  was given.       Aortic regurgitation    Secondary to congenital 4 leaf valve.  She is asymptomatic and is reminded to have annual surveillance with cardiologl ECHO      Relevant Medications   aspirin 500 MG tablet    Other Visit Diagnoses    Acute left-sided low back pain without sciatica       Relevant Medications   aspirin 500 MG tablet   Other Relevant Orders   DG Lumbar Spine Complete (Completed)   Need for measles-mumps-rubella (MMR) vaccine       Relevant Orders   Measles ([place]) Antibody IgG      I have discontinued Danika A. Mould "Becky"'s acetaminophen and HYDROcodone-acetaminophen. I am also having her start on Zoster Vaccine Adjuvanted. Additionally, I am having her maintain her Biotin, Vitamin D3, levothyroxine, ALPRAZolam, cyclobenzaprine, raloxifene, and aspirin.  Meds ordered this encounter  Medications  . Zoster Vaccine Adjuvanted [facility]) injection    Sig: Inject 0.5 mLs into the muscle once for 1 dose.    Dispense:  1 each    Refill:  1    Medications Discontinued During This Encounter  Medication Reason  . acetaminophen (TYLENOL) 500 MG tablet Patient has not taken in last 30 days  . HYDROcodone-acetaminophen (NORCO/VICODIN) 5-325 MG tablet Patient has not taken in last 30 days    Follow-up: Return in  about 1 year (around 02/18/2019).   Crecencio Mc, MD

## 2018-02-17 NOTE — Patient Instructions (Addendum)
Theresa [name] bullionis our Glass blower/designer.  She would LOVE  To know about your patient experience in December   The ShingRx vaccine is now available here and  in local pharmacies and is much more protective thant Zostavaxs,  It is therefore ADVISED for all interested adults over 50 to prevent shingles .  The cost without insurance if given here  is $250-300 PER INJECTION ( 2 ARE NEEDED) SO CHECK WITH YOUR INSURANCE BEFORE DECIDING WHEN AND WHERE TO GET IT.  YOU DO NOT HAVE A CERVIX!!!! (Your hysterectomy was TOTAL)   I am referring you to Endocrinology to review options for treating yoru osteopenia since it has progressed on Evista   Health Maintenance for Postmenopausal Women Menopause is a normal process in which your reproductive ability comes to an end. This process happens gradually over a span of months to years, usually between the ages of 75 and 21. Menopause is complete when you have missed 12 consecutive menstrual periods. It is important to talk with your health care provider about some of the most common conditions that affect postmenopausal women, such as heart disease, cancer, and bone loss (osteoporosis). Adopting a healthy lifestyle and getting preventive care can help to promote your health and wellness. Those actions can also lower your chances of developing some of these common conditions. What should I know about menopause? During menopause, you may experience a number of symptoms, such as:  Moderate-to-severe hot flashes.  Night sweats.  Decrease in sex drive.  Mood swings.  Headaches.  Tiredness.  Irritability.  Memory problems.  Insomnia.  Choosing to treat or not to treat menopausal changes is an individual decision that you make with your health care provider. What should I know about hormone replacement therapy and supplements? Hormone therapy products are effective for treating symptoms that are associated with menopause, such as hot flashes and night sweats.  Hormone replacement carries certain risks, especially as you become older. If you are thinking about using estrogen or estrogen with progestin treatments, discuss the benefits and risks with your health care provider. What should I know about heart disease and stroke? Heart disease, heart attack, and stroke become more likely as you age. This may be due, in part, to the hormonal changes that your body experiences during menopause. These can affect how your body processes dietary fats, triglycerides, and cholesterol. Heart attack and stroke are both medical emergencies. There are many things that you can do to help prevent heart disease and stroke:  Have your blood pressure checked at least every 1-2 years. High blood pressure causes heart disease and increases the risk of stroke.  If you are 10-45 years old, ask your health care provider if you should take aspirin to prevent a heart attack or a stroke.  Do not use any tobacco products, including cigarettes, chewing tobacco, or electronic cigarettes. If you need help quitting, ask your health care provider.  It is important to eat a healthy diet and maintain a healthy weight. ? Be sure to include plenty of vegetables, fruits, low-fat dairy products, and lean protein. ? Avoid eating foods that are high in solid fats, added sugars, or salt (sodium).  Get regular exercise. This is one of the most important things that you can do for your health. ? Try to exercise for at least 150 minutes each week. The type of exercise that you do should increase your heart rate and make you sweat. This is known as moderate-intensity exercise. ? Try to do strengthening  exercises at least twice each week. Do these in addition to the moderate-intensity exercise.  Know your numbers.Ask your health care provider to check your cholesterol and your blood glucose. Continue to have your blood tested as directed by your health care provider.  What should I know about cancer  screening? There are several types of cancer. Take the following steps to reduce your risk and to catch any cancer development as early as possible. Breast Cancer  Practice breast self-awareness. ? This means understanding how your breasts normally appear and feel. ? It also means doing regular breast self-exams. Let your health care provider know about any changes, no matter how small.  If you are 57 or older, have a clinician do a breast exam (clinical breast exam or CBE) every year. Depending on your age, family history, and medical history, it may be recommended that you also have a yearly breast X-ray (mammogram).  If you have a family history of breast cancer, talk with your health care provider about genetic screening.  If you are at high risk for breast cancer, talk with your health care provider about having an MRI and a mammogram every year.  Breast cancer (BRCA) gene test is recommended for women who have family members with BRCA-related cancers. Results of the assessment will determine the need for genetic counseling and BRCA1 and for BRCA2 testing. BRCA-related cancers include these types: ? Breast. This occurs in males or females. ? Ovarian. ? Tubal. This may also be called fallopian tube cancer. ? Cancer of the abdominal or pelvic lining (peritoneal cancer). ? Prostate. ? Pancreatic.  Cervical, Uterine, and Ovarian Cancer Your health care provider may recommend that you be screened regularly for cancer of the pelvic organs. These include your ovaries, uterus, and vagina. This screening involves a pelvic exam, which includes checking for microscopic changes to the surface of your cervix (Pap test).  For women ages 21-65, health care providers may recommend a pelvic exam and a Pap test every three years. For women ages 70-65, they may recommend the Pap test and pelvic exam, combined with testing for human papilloma virus (HPV), every five years. Some types of HPV increase your  risk of cervical cancer. Testing for HPV may also be done on women of any age who have unclear Pap test results.  Other health care providers may not recommend any screening for nonpregnant women who are considered low risk for pelvic cancer and have no symptoms. Ask your health care provider if a screening pelvic exam is right for you.  If you have had past treatment for cervical cancer or a condition that could lead to cancer, you need Pap tests and screening for cancer for at least 20 years after your treatment. If Pap tests have been discontinued for you, your risk factors (such as having a new sexual partner) need to be reassessed to determine if you should start having screenings again. Some women have medical problems that increase the chance of getting cervical cancer. In these cases, your health care provider may recommend that you have screening and Pap tests more often.  If you have a family history of uterine cancer or ovarian cancer, talk with your health care provider about genetic screening.  If you have vaginal bleeding after reaching menopause, tell your health care provider.  There are currently no reliable tests available to screen for ovarian cancer.  Lung Cancer Lung cancer screening is recommended for adults 59-5 years old who are at high risk for  lung cancer because of a history of smoking. A yearly low-dose CT scan of the lungs is recommended if you:  Currently smoke.  Have a history of at least 30 pack-years of smoking and you currently smoke or have quit within the past 15 years. A pack-year is smoking an average of one pack of cigarettes per day for one year.  Yearly screening should:  Continue until it has been 15 years since you quit.  Stop if you develop a health problem that would prevent you from having lung cancer treatment.  Colorectal Cancer  This type of cancer can be detected and can often be prevented.  Routine colorectal cancer screening usually  begins at age 30 and continues through age 70.  If you have risk factors for colon cancer, your health care provider may recommend that you be screened at an earlier age.  If you have a family history of colorectal cancer, talk with your health care provider about genetic screening.  Your health care provider may also recommend using home test kits to check for hidden blood in your stool.  A small camera at the end of a tube can be used to examine your colon directly (sigmoidoscopy or colonoscopy). This is done to check for the earliest forms of colorectal cancer.  Direct examination of the colon should be repeated every 5-10 years until age 24. However, if early forms of precancerous polyps or small growths are found or if you have a family history or genetic risk for colorectal cancer, you may need to be screened more often.  Skin Cancer  Check your skin from head to toe regularly.  Monitor any moles. Be sure to tell your health care provider: ? About any new moles or changes in moles, especially if there is a change in a mole's shape or color. ? If you have a mole that is larger than the size of a pencil eraser.  If any of your family members has a history of skin cancer, especially at a young age, talk with your health care provider about genetic screening.  Always use sunscreen. Apply sunscreen liberally and repeatedly throughout the day.  Whenever you are outside, protect yourself by wearing long sleeves, pants, a wide-brimmed hat, and sunglasses.  What should I know about osteoporosis? Osteoporosis is a condition in which bone destruction happens more quickly than new bone creation. After menopause, you may be at an increased risk for osteoporosis. To help prevent osteoporosis or the bone fractures that can happen because of osteoporosis, the following is recommended:  If you are 25-2 years old, get at least 1,000 mg of calcium and at least 600 mg of vitamin D per day.  If you  are older than age 28 but younger than age 5, get at least 1,200 mg of calcium and at least 600 mg of vitamin D per day.  If you are older than age 22, get at least 1,200 mg of calcium and at least 800 mg of vitamin D per day.  Smoking and excessive alcohol intake increase the risk of osteoporosis. Eat foods that are rich in calcium and vitamin D, and do weight-bearing exercises several times each week as directed by your health care provider. What should I know about how menopause affects my mental health? Depression may occur at any age, but it is more common as you become older. Common symptoms of depression include:  Low or sad mood.  Changes in sleep patterns.  Changes in appetite or eating  patterns.  Feeling an overall lack of motivation or enjoyment of activities that you previously enjoyed.  Frequent crying spells.  Talk with your health care provider if you think that you are experiencing depression. What should I know about immunizations? It is important that you get and maintain your immunizations. These include:  Tetanus, diphtheria, and pertussis (Tdap) booster vaccine.  Influenza every year before the flu season begins.  Pneumonia vaccine.  Shingles vaccine.  Your health care provider may also recommend other immunizations. This information is not intended to replace advice given to you by your health care provider. Make sure you discuss any questions you have with your health care provider. Document Released: 10/22/2005 Document Revised: 03/19/2016 Document Reviewed: 06/03/2015 Elsevier Interactive Patient Education  2018 [employer].

## 2018-02-19 ENCOUNTER — Encounter: Payer: Self-pay | Admitting: Internal Medicine

## 2018-02-19 NOTE — Assessment & Plan Note (Addendum)
Her pain and radiculopathy have resolved with decompressive surgery on L3-L4 .  Plain films of lumbar spine were done today to rule out hardware damage and compression fracture due to recent fall.

## 2018-02-19 NOTE — Assessment & Plan Note (Signed)
DEXA shows progression despite several years of raloxifene.  Discussed referral to Endocrinology

## 2018-02-19 NOTE — Assessment & Plan Note (Addendum)
Awaiting report from August 2018 colonoscopy; reportedly normal,  10 yr follow up per patient.

## 2018-02-19 NOTE — Assessment & Plan Note (Signed)
Annual comprehensive preventive exam was done as well as an evaluation and management of chronic conditions .  During the course of the visit the patient was educated and counseled about appropriate screening and preventive services including :  diabetes screening, lipid analysis with projected  10 year  risk for CAD , nutrition counseling, breast, and colorectal cancer screening, and recommended immunizations.  Printed recommendations for health maintenance screenings was given 

## 2018-02-19 NOTE — Assessment & Plan Note (Signed)
Secondary to congenital 4 leaf valve.  She is asymptomatic and is reminded to have annual surveillance with cardiologl ECHO

## 2018-02-19 NOTE — Assessment & Plan Note (Signed)
Thyroid function is due  Lab Results  Component Value Date   TSH 1.23 07/15/2017

## 2018-02-19 NOTE — Assessment & Plan Note (Signed)
Managed with prn alprazolam . The risks and benefits of benzodiazepine use were reviewed  with patient today including excessive sedation leading to respiratory depression,  impaired thinking/driving, and addiction.  Patient was advised to avoid concurrent use with alcohol, to use medication only as needed and not to share with others  .  

## 2018-02-19 NOTE — Assessment & Plan Note (Signed)
Confirmed on exam today.  No PAP smear was done

## 2018-02-20 LAB — RUBEOLA ANTIBODY IGG

## 2018-02-20 LAB — TSH: TSH: 1.38 m[IU]/L (ref 0.40–4.50)

## 2018-02-22 ENCOUNTER — Other Ambulatory Visit: Payer: Self-pay | Admitting: Internal Medicine

## 2018-02-22 MED ORDER — LEVOTHYROXINE SODIUM 88 MCG PO TABS: ORAL_TABLET | ORAL | 3 refills | Status: DC

## 2018-03-03 ENCOUNTER — Encounter: Payer: Self-pay | Admitting: Internal Medicine

## 2018-03-03 ENCOUNTER — Telehealth: Payer: Self-pay | Admitting: *Deleted

## 2018-03-03 NOTE — Telephone Encounter (Signed)
Copied from CRM [id number]. Topic: Appointment Scheduling - Scheduling Inquiry for Clinic >> Mar 03, 2018  9:28 AM Oneal GroutSebastian, Jennifer S wrote: Reason for CRM: Requesting shingrix injections

## 2018-03-03 NOTE — Telephone Encounter (Signed)
FYI scheduled patient for Shingrix vaccine OK?

## 2018-03-03 NOTE — Telephone Encounter (Signed)
Yes

## 2018-03-10 ENCOUNTER — Other Ambulatory Visit: Payer: Self-pay | Admitting: Internal Medicine

## 2018-03-23 ENCOUNTER — Ambulatory Visit (INDEPENDENT_AMBULATORY_CARE_PROVIDER_SITE_OTHER): Payer: No Typology Code available for payment source

## 2018-03-23 DIAGNOSIS — Z23 Encounter for immunization: Secondary | ICD-10-CM | POA: Diagnosis not present

## 2018-03-23 NOTE — Progress Notes (Signed)
Patient comes in today for shingrix vaccine. Administered in left deltoid IM. Patient tolerated well. Patient is aware to return after two months for second dose.

## 2018-03-24 DIAGNOSIS — G5600 Carpal tunnel syndrome, unspecified upper limb: Secondary | ICD-10-CM | POA: Insufficient documentation

## 2018-04-06 ENCOUNTER — Other Ambulatory Visit: Payer: Self-pay | Admitting: Neurosurgery

## 2018-04-06 DIAGNOSIS — S129XXA Fracture of neck, unspecified, initial encounter: Secondary | ICD-10-CM

## 2018-04-13 ENCOUNTER — Ambulatory Visit (INDEPENDENT_AMBULATORY_CARE_PROVIDER_SITE_OTHER): Payer: No Typology Code available for payment source

## 2018-04-13 DIAGNOSIS — Z23 Encounter for immunization: Secondary | ICD-10-CM

## 2018-04-13 NOTE — Progress Notes (Signed)
Patient comes in for MMR vaccine. Injected right arm subcutaneously. Patient tolerated injection well.

## 2018-04-18 ENCOUNTER — Encounter: Payer: Self-pay | Admitting: Internal Medicine

## 2018-04-21 ENCOUNTER — Encounter: Payer: Self-pay | Admitting: Gastroenterology

## 2018-04-21 ENCOUNTER — Ambulatory Visit
Admission: RE | Admit: 2018-04-21 | Discharge: 2018-04-21 | Disposition: A | Discharge status: Home / Self Care | Payer: No Typology Code available for payment source | Source: Ambulatory Visit | Attending: Neurosurgery | Admitting: Neurosurgery

## 2018-04-21 DIAGNOSIS — S129XXA Fracture of neck, unspecified, initial encounter: Secondary | ICD-10-CM

## 2018-04-21 DIAGNOSIS — M4800 Spinal stenosis, site unspecified: Secondary | ICD-10-CM | POA: Insufficient documentation

## 2018-04-28 DIAGNOSIS — R202 Paresthesia of skin: Secondary | ICD-10-CM | POA: Insufficient documentation

## 2018-05-11 ENCOUNTER — Ambulatory Visit (INDEPENDENT_AMBULATORY_CARE_PROVIDER_SITE_OTHER): Payer: No Typology Code available for payment source

## 2018-05-11 DIAGNOSIS — Z23 Encounter for immunization: Secondary | ICD-10-CM | POA: Diagnosis not present

## 2018-05-11 NOTE — Progress Notes (Signed)
Patient comes in for MMR vaccine . Given in left arm subcutaneously.

## 2018-05-25 ENCOUNTER — Ambulatory Visit (INDEPENDENT_AMBULATORY_CARE_PROVIDER_SITE_OTHER): Payer: No Typology Code available for payment source

## 2018-05-25 DIAGNOSIS — Z23 Encounter for immunization: Secondary | ICD-10-CM | POA: Diagnosis not present

## 2018-05-25 NOTE — Progress Notes (Addendum)
Patient comes in for 2nd  Shingrix injection .Injected right deltoid. Patient tolerated injection well.

## 2018-06-14 NOTE — Telephone Encounter (Signed)
Pt calling to schedule this appointment, but there is nothing soon.  Pt wants to know if she can be seen on Friday. Pt can be reached at [phone].

## 2018-06-15 ENCOUNTER — Telehealth: Payer: Self-pay

## 2018-06-15 NOTE — Telephone Encounter (Signed)
Copied from CRM [id number]. Topic: Quick Communication - See Telephone Encounter >> Jun 15, 2018  2:04 PM Theresa [name] wrote: Pt calling back to see if an appt with Dr. Darrick Huntsman for Friday is available

## 2018-06-15 NOTE — Telephone Encounter (Signed)
Patient called Back schedule appt on 08/04/18 She is open to a cancelation on any Friday.

## 2018-06-16 NOTE — Telephone Encounter (Signed)
Spoke with pt and she has been scheduled for next Wednesday 06/21/2018 at 11:30am. Pt is aware of appt date and time.

## 2018-06-16 NOTE — Telephone Encounter (Signed)
Spoke with pt and she has been scheduled for 06/21/2018 @ 11:30. Pt is aware of appt date and time.

## 2018-06-16 NOTE — Telephone Encounter (Signed)
Please advise. I wasn't sure if she should be scheduled just for an x ray or an office visit.

## 2018-06-21 ENCOUNTER — Ambulatory Visit (INDEPENDENT_AMBULATORY_CARE_PROVIDER_SITE_OTHER): Payer: No Typology Code available for payment source

## 2018-06-21 ENCOUNTER — Ambulatory Visit: Payer: No Typology Code available for payment source | Admitting: Internal Medicine

## 2018-06-21 ENCOUNTER — Encounter: Payer: Self-pay | Admitting: Internal Medicine

## 2018-06-21 VITALS — BP 136/70 | HR 66 | Temp 98.2°F | Resp 14 | Ht 66.0 in | Wt 148.0 lb

## 2018-06-21 DIAGNOSIS — G629 Polyneuropathy, unspecified: Secondary | ICD-10-CM | POA: Diagnosis not present

## 2018-06-21 DIAGNOSIS — E034 Atrophy of thyroid (acquired): Secondary | ICD-10-CM | POA: Diagnosis not present

## 2018-06-21 DIAGNOSIS — M25532 Pain in left wrist: Secondary | ICD-10-CM

## 2018-06-21 DIAGNOSIS — M4722 Other spondylosis with radiculopathy, cervical region: Secondary | ICD-10-CM

## 2018-06-21 DIAGNOSIS — M79642 Pain in left hand: Secondary | ICD-10-CM

## 2018-06-21 DIAGNOSIS — M79641 Pain in right hand: Secondary | ICD-10-CM | POA: Diagnosis not present

## 2018-06-21 DIAGNOSIS — M85859 Other specified disorders of bone density and structure, unspecified thigh: Secondary | ICD-10-CM

## 2018-06-21 LAB — SEDIMENTATION RATE: Sed Rate: 2 mm/h (ref 0–30)

## 2018-06-21 LAB — VITAMIN B12: VITAMIN B 12: 546 pg/mL (ref 200–1100)

## 2018-06-21 MED ORDER — CELECOXIB 100 MG PO CAPS
100.0000 mg | ORAL_CAPSULE | Freq: Two times a day (BID) | ORAL | 2 refills | Status: DC
Start: 2018-06-21 — End: 2019-01-01

## 2018-06-21 MED ORDER — PANTOPRAZOLE SODIUM 40 MG PO TBEC: 40.0000 mg | DELAYED_RELEASE_TABLET | Freq: Every day | ORAL | 3 refills | Status: DC

## 2018-06-21 NOTE — Progress Notes (Signed)
Subjective:  Patient ID: Theresa [name], female    DOB: 05-25-1956  Age: 62 y.o. MRN: [id number]  CC: The primary encounter diagnosis was Neuropathy. Diagnoses of Left wrist pain, Right hand pain, Bilateral hand pain, Hypothyroidism due to acquired atrophy of thyroid, Osteopenia of neck of femur, unspecified laterality, and Osteoarthritis of spine with radiculopathy, cervical region were also pertinent to this visit.  HPI Theresa [name] presents for evaluation of bilateral  joint pain and wrist pain acc'd by tingling and numbness involving the hands.   Stiffness Most noticeable in the morning . l 6 to 8 months in duration ,  c6 distribution of numbness .   Occupational overuse potential as a dental hygienest. Known cervical disk disease with prior surgery involving C3 through C6   Had neurosurgery EMG conduction studies recently by Dr Sherwood Gambler, which were  negative for median nerve entrapment  But affecting c4 to c6  levels DOES NOT WANT MORE SURGERY   FH of psoriatic athritis in sister , wants to rule out autoimmune causes  Has been using 500 mg aspirin and 600 mg ibuprofen daily. Transient mild improvement  .  Discussed celebrex     Outpatient Medications Prior to Visit  Medication Sig Dispense Refill  . alendronate (FOSAMAX) 70 MG tablet TAKE 1 TABLET BY MOUTH ONCE A WEEK WITH A FULL GLASS OF WATER. DO NOT LIE DOWN FOR THE NEXT 30 MINUTES  3  . ALPRAZolam (XANAX) 0.25 MG tablet TAKE 1 TABLET BY MOUTH TWICE DAILY AS NEEDED FOR ANXIETY (Patient taking differently: TAKE 0.25 MG BY MOUTH TWICE DAILY AS NEEDED FOR ANXIETY) 30 tablet 3  . aspirin 500 MG tablet     . Biotin 10000 MCG TABS Take 10,000 mcg by mouth daily.     . Cholecalciferol (VITAMIN D3) 1000 units CAPS Take 1,000 Units by mouth daily.     . cyclobenzaprine (FLEXERIL) 10 MG tablet Take 0.5-1 tablets (5-10 mg total) by mouth 3 (three) times daily as needed for muscle spasms. 40 tablet 0  . levothyroxine (SYNTHROID,  LEVOTHROID) 88 MCG tablet TAKE 88 MCG BY MOUTH ONCE DAILY 90 tablet 3  . raloxifene (EVISTA) 60 MG tablet TAKE 1 TABLET BY MOUTH ONCE DAILY 90 tablet 1   No facility-administered medications prior to visit.     Review of Systems;  Patient denies headache, fevers, malaise, unintentional weight loss, skin rash, eye pain, sinus congestion and sinus pain, sore throat, dysphagia,  hemoptysis , cough, dyspnea, wheezing, chest pain, palpitations, orthopnea, edema, abdominal pain, nausea, melena, diarrhea, constipation, flank pain, dysuria, hematuria, urinary  Frequency, nocturia, numbness, tingling, seizures,  Focal weakness, Loss of consciousness,  Tremor, insomnia, depression, anxiety, and suicidal ideation.      Objective:  BP 136/70 (BP Location: Left Arm, Patient Position: Sitting, Cuff Size: Normal)   Pulse 66   Temp 98.2 F (36.8 C) (Oral)   Resp 14   Ht _0  (1.676 m)   Wt 148 lb (67.1 kg)   SpO2 100%   BMI 23.89 kg/m   BP Readings from Last 3 Encounters:  06/21/18 136/70  02/17/18 120/68  09/01/17 (!) 99/59    Wt Readings from Last 3 Encounters:  06/21/18 148 lb (67.1 kg)  02/17/18 143 lb 9.6 oz (65.1 kg)  08/31/17 141 lb 6.4 oz (64.1 kg)    General appearance: alert, cooperative and appears stated age Neck: no adenopathy, no carotid bruit, supple, symmetrical, trachea midline and thyroid not enlarged, symmetric, no tenderness/mass/nodules anteriorly  Mld tenderness posteriorly at C6 level  Back: symmetric, no curvature. ROM normal. No CVA tenderness. Lungs: clear to auscultation bilaterally Heart: regular rate and rhythm, S1, S2 normal, no murmur, click, rub or gallop Abdomen: soft, non-tender; bowel sounds normal; no masses,  no organomegaly Pulses: 2+ and symmetric MSK: PIPs,  MCPs without synovitis. Heberden's nodes bilaterally  Skin: Skin color, texture, turgor normal. No rashes or lesions Lymph nodes: Cervical, supraclavicular, and axillary nodes normal.  No  results found for: HGBA1C  Lab Results  Component Value Date   CREATININE 0.75 08/26/2017   CREATININE 0.76 07/15/2017   CREATININE 0.82 01/28/2017    Lab Results  Component Value Date   WBC 5.9 08/26/2017   HGB 13.8 08/26/2017   HCT 41.4 08/26/2017   PLT 247 08/26/2017   GLUCOSE 94 08/26/2017   CHOL 182 01/28/2017   TRIG 60 01/28/2017   HDL 87 01/28/2017   LDLDIRECT 129.4 01/02/2013   LDLCALC 83 01/28/2017   ALT 13 07/15/2017   AST 20 07/15/2017   NA 138 08/26/2017   K 4.3 08/26/2017   CL 104 08/26/2017   CREATININE 0.75 08/26/2017   BUN 12 08/26/2017   CO2 28 08/26/2017   TSH 1.38 02/17/2018    Ct Cervical Spine Wo Contrast  Result Date: 04/21/2018 CLINICAL DATA:  Neck pain with left arm and shoulder numbness and tingling. History of cervical fusion. Pseudoarthrosis. EXAM: CT CERVICAL SPINE WITHOUT CONTRAST TECHNIQUE: Multidetector CT imaging of the cervical spine was performed without intravenous contrast. Multiplanar CT image reconstructions were also generated. COMPARISON:  Radiographs 10/26/2017.  MRI 04/05/2018. FINDINGS: Alignment: Normal. Skull base and vertebrae: Status post anterior discectomy and fusion from C3 through C7. There are 2 separate anterior plates extending from C3 through C5 and from C5 through C7. There is no hardware loosening. However, there is persistent pseudoarthrosis at C4-5 with lack of solid interbody fusion. Abnormal motion was demonstrated on the prior cervical radiographs at this level. Interbody fusion appears solid at C3-4, C5-6 and C6-7. Soft tissues and spinal canal: No prevertebral fluid or swelling. No visible canal hematoma. Disc levels: Stable asymmetric uncinate spurring on the right at C4-5 without significant osseous foraminal narrowing. Mild osseous narrowing at C5-6 and C6-7 secondary to uncinate spurring. Upper chest: Mild biapical scarring. Other: None. IMPRESSION: 1. 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 No  hardware loosening or displacement. 2. Solid interbody fusion at C3-4, C5-6 and C6-7. 3. Stable mild spondylosis and uncinate spurring contributing to mild foraminal narrowing at C5-6 and C6-7. Electronically Signed   By: Richardean Sale M.D.   On: 04/21/2018 16:33    Assessment & Plan:   Problem List Items Addressed This Visit    Bilateral hand pain    No definite erosive changes were noted on plain films of right hand and left wirist ,  And ESR and ANA are normal.  pain appears to be due to OA and referred pain from cervical disk disease.  Trial of celebrex  Lab Results  Component Value Date   ESRSEDRATE 2 06/21/2018   Lab Results  Component Value Date   ANA NEGATIVE 06/21/2018         Cervical spine degeneration    Now with radiculopathy to hands.  Trial of celebrex .  Does not tolerate tramadol  Will consider adding gabapentin       Relevant Medications   celecoxib (CELEBREX) 100 MG capsule   Hypothyroidism    Thyroid function was WNL on  current dose.  No current changes needed.   Lab Results  Component Value Date   TSH 1.38 02/17/2018         Osteopenia    Progressive despite 3 years of Evista,  Calcium supplement and vitamin D replacement,  With 10 yr risk of fracture 20% per Dr Gabriel Carina.  She is Now taking alendronate per Endocrinology evaluation Sept 2019.  Next DEXA Sept 2020       Other Visit Diagnoses    Neuropathy    -  Primary   Relevant Orders   Vitamin B12 (Completed)   Antinuclear Antib (ANA) (Completed)   Left wrist pain       Relevant Orders   DG Wrist Complete Left (Completed)   Sedimentation rate (Completed)   ANA w/Reflex if Positive   Right hand pain       Relevant Orders   DG Hand Complete Right (Completed)      I have discontinued Zyair A. Meyn "Becky"'s raloxifene. I am also having her start on celecoxib and pantoprazole. Additionally, I am having her maintain her Biotin, Vitamin D3, ALPRAZolam, cyclobenzaprine, aspirin, levothyroxine,  and alendronate.  Meds ordered this encounter  Medications  . celecoxib (CELEBREX) 100 MG capsule    Sig: Take 1 capsule (100 mg total) by mouth 2 (two) times daily.    Dispense:  60 capsule    Refill:  2  . pantoprazole (PROTONIX) 40 MG tablet    Sig: Take 1 tablet (40 mg total) by mouth daily.    Dispense:  30 tablet    Refill:  3    Medications Discontinued During This Encounter  Medication Reason  . raloxifene (EVISTA) 60 MG tablet Change in therapy    Follow-up: No follow-ups on file.   Crecencio Mc, MD

## 2018-06-21 NOTE — Patient Instructions (Addendum)
Take your protonix at bedtime  TRIAL OF celebrex every 12 hours as needed (instead of aspirin and ibuprofen)    Labs / rays today

## 2018-06-22 LAB — ANA: ANA: NEGATIVE

## 2018-06-24 ENCOUNTER — Encounter: Payer: Self-pay | Admitting: Internal Medicine

## 2018-06-24 DIAGNOSIS — M79641 Pain in right hand: Secondary | ICD-10-CM | POA: Insufficient documentation

## 2018-06-24 DIAGNOSIS — M79642 Pain in left hand: Secondary | ICD-10-CM

## 2018-06-24 NOTE — Assessment & Plan Note (Signed)
No definite erosive changes were noted on plain films of right hand and left wirist ,  And ESR and ANA are normal.  pain appears to be due to OA and referred pain from cervical disk disease.  Trial of celebrex  Lab Results  Component Value Date   ESRSEDRATE 2 06/21/2018   Lab Results  Component Value Date   ANA NEGATIVE 06/21/2018    

## 2018-06-24 NOTE — Assessment & Plan Note (Signed)
Thyroid function was WNL on current dose.  No current changes needed.   Lab Results  Component Value Date   TSH 1.38 02/17/2018

## 2018-06-24 NOTE — Assessment & Plan Note (Signed)
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  Trial of celebrex .  Does not tolerate tramadol  Will consider adding gabapentin

## 2018-06-24 NOTE — Assessment & Plan Note (Addendum)
Progressive despite 3 years of Evista,  Calcium supplement and vitamin D replacement,  With 10 yr risk of fracture 20% per Dr Tedd Sias.  She is Now taking alendronate per Endocrinology evaluation Sept 2019.  Next DEXA Sept 2020

## 2018-06-29 ENCOUNTER — Other Ambulatory Visit: Payer: Self-pay | Admitting: Internal Medicine

## 2018-06-29 DIAGNOSIS — Z1231 Encounter for screening mammogram for malignant neoplasm of breast: Secondary | ICD-10-CM

## 2018-07-10 ENCOUNTER — Telehealth: Payer: Self-pay

## 2018-07-10 NOTE — Telephone Encounter (Signed)
Copied from CRM [id number]. Topic: General - Inquiry >> Jul 10, 2018  1:08 PM Windy Kalata, NT wrote: Reason for CRM: Rosey Bath is calling from [facility] and states they received a medical records request but it did not indicate a time line for which years, or months they are needing. She states she has records from 2001-2019. Please contact Rosey Bath at [phone]

## 2018-07-12 NOTE — Telephone Encounter (Signed)
The last 2 years and any surgical notes

## 2018-07-12 NOTE — Telephone Encounter (Signed)
What records were you requesting from [facility]?

## 2018-07-13 NOTE — Telephone Encounter (Signed)
Spoke with Rosey Bath to let her know what records we needed. Rosey Bath stated that she is faxing those records over now.

## 2018-08-04 ENCOUNTER — Other Ambulatory Visit: Payer: Self-pay | Admitting: Internal Medicine

## 2018-08-04 ENCOUNTER — Ambulatory Visit: Payer: No Typology Code available for payment source | Admitting: Internal Medicine

## 2018-08-04 MED ORDER — ALPRAZOLAM 0.25 MG PO TABS: 0.2500 mg | ORAL_TABLET | Freq: Every evening | ORAL | 3 refills | Status: DC | PRN

## 2018-08-18 ENCOUNTER — Ambulatory Visit
Admission: RE | Admit: 2018-08-18 | Discharge: 2018-08-18 | Disposition: A | Discharge status: Home / Self Care | Payer: No Typology Code available for payment source | Source: Ambulatory Visit | Attending: Internal Medicine | Admitting: Internal Medicine

## 2018-08-18 DIAGNOSIS — Z1231 Encounter for screening mammogram for malignant neoplasm of breast: Secondary | ICD-10-CM | POA: Diagnosis not present

## 2018-09-21 ENCOUNTER — Other Ambulatory Visit: Payer: Self-pay | Admitting: Nephrology

## 2018-09-21 DIAGNOSIS — Z9489 Other transplanted organ and tissue status: Secondary | ICD-10-CM

## 2018-12-27 ENCOUNTER — Telehealth: Payer: Self-pay

## 2018-12-27 NOTE — Telephone Encounter (Signed)
   Mobile number for video call: [phone]   Virtual Visit Pre-Appointment Phone Call  Steps For Call:  1. Confirm consent - "In the setting of the current Covid19 crisis, you are scheduled for a VIDEO visit with your provider on 4/20 at 330pm.  Just as we do with many in-office visits, in order for you to participate in this visit, we must obtain consent.  If you'd like, I can send this to your mychart (if signed up) or email for you to review.  Otherwise, I can obtain your verbal consent now.  All virtual visits are billed to your insurance company just like a normal visit would be.  By agreeing to a virtual visit, we'd like you to understand that the technology does not allow for your provider to perform an examination, and thus may limit your provider's ability to fully assess your condition.  Finally, though the technology is pretty good, we cannot assure that it will always work on either your or our end, and in the setting of a video visit, we may have to convert it to a phone-only visit.  In either situation, we cannot ensure that we have a secure connection.  Are you willing to proceed?" STAFF: Did the patient verbally acknowledge consent to telehealth visit? Document YES/NO here:YES  2. Confirm the BEST phone number to call the day of the visit by including in appointment notes  3. Give patient instructions for WebEx/MyChart download to smartphone as below or [email or web address] if video visit (depending on what platform provider is using)  4. Advise patient to be prepared with their blood pressure, heart rate, weight, any heart rhythm information, their current medicines, and a piece of paper and pen handy for any instructions they may receive the day of their visit  5. Inform patient they will receive a phone call 15 minutes prior to their appointment time (may be from unknown caller ID) so they should be prepared to answer  6. Confirm that appointment type is correct in Epic  appointment notes (VIDEO vs PHONE)     TELEPHONE CALL NOTE  Theresa [name] has been deemed a candidate for a follow-up tele-health visit to limit community exposure during the Covid-19 pandemic. I spoke with the patient via phone to ensure availability of phone/video source, confirm preferred email & phone number, and discuss instructions and expectations.  I reminded Theresa [name] to be prepared with any vital sign and/or heart rhythm information that could potentially be obtained via home monitoring, at the time of her visit. I reminded Theresa [name] to expect a phone call at the time of her visit if her visit.  Leanord Hawking, RN 12/27/2018 10:37 AM

## 2019-01-01 ENCOUNTER — Telehealth: Payer: No Typology Code available for payment source | Admitting: Internal Medicine

## 2019-01-01 ENCOUNTER — Encounter: Payer: Self-pay | Admitting: Internal Medicine

## 2019-01-01 ENCOUNTER — Telehealth (INDEPENDENT_AMBULATORY_CARE_PROVIDER_SITE_OTHER): Payer: No Typology Code available for payment source | Admitting: Internal Medicine

## 2019-01-01 ENCOUNTER — Other Ambulatory Visit: Payer: Self-pay

## 2019-01-01 VITALS — Ht 66.0 in | Wt 145.0 lb

## 2019-01-01 DIAGNOSIS — H9209 Otalgia, unspecified ear: Secondary | ICD-10-CM

## 2019-01-01 DIAGNOSIS — J029 Acute pharyngitis, unspecified: Secondary | ICD-10-CM

## 2019-01-01 DIAGNOSIS — I359 Nonrheumatic aortic valve disorder, unspecified: Secondary | ICD-10-CM | POA: Diagnosis not present

## 2019-01-01 MED ORDER — METHYLPREDNISOLONE 4 MG PO TBPK: ORAL_TABLET | ORAL | 0 refills | Status: DC

## 2019-01-01 NOTE — Progress Notes (Signed)
Virtual Visit via Video Note   This visit type was conducted due to national recommendations for restrictions regarding the COVID-19 Pandemic (e.g. social distancing) in an effort to limit this patient's exposure and mitigate transmission in our community.  Due to her co-morbid illnesses, this patient is at least at moderate risk for complications without adequate follow up.  This format is felt to be most appropriate for this patient at this time.  All issues noted in this document were discussed and addressed.  A limited physical exam was performed with this format.  Please refer to the patient's chart for her consent to telehealth for [facility].   Evaluation Performed:  Follow-up visit  Date:  01/01/2019   ID:  Theresa [name], DOB [birth year], MRN [id number]  Patient Location: Home Provider Location: Home  PCP:  Sherlene Shams, MD  Cardiologist:  Tenny Craw Electrophysiologist:  None   Chief Complaint:  F/U of AV dz    History of Present Illness:    Theresa [name] is a 63 y.o. female with hx of quadricuspid aorti valve and moderate AI   I saw her in clinic back in September 2018 Repeat echo in Jan 2019 LV remained normal in size and AI moderate     The pt says she is doing good   Breahting is good   No CP No SOB   No edema      The patientdoes not have symptoms concerning for COVID-19 infection (fever, chills, cough, or new shortness of breath).    Past Medical History:  Diagnosis Date  . Breast screening, unspecified   . H/O: Bell's palsy   . Headache   . Heart murmur   . History of kidney stones   . Hypercalcemia   . Osteoporosis   . Screening for malignant neoplasm of the cervix   . Special screening for malignant neoplasms, colon   . Unspecified hypothyroidism    thyroid nodule  . Unspecified otitis media    Past Surgical History:  Procedure Laterality Date  . ANTERIOR CERVICAL DECOMP/DISCECTOMY FUSION  13,15   C3 ,4 ,5 [facility]  . ANTERIOR FUSION CERVICAL SPINE   2013   [facility]  c5-c6  . CHOLECYSTECTOMY  1989  . dental implant    . MENISCUS REPAIR Right 01/2010   [facility]  . PARTIAL HYSTERECTOMY     secondary to fibroids  . SPINE SURGERY     [facility], C3, 4 5     Current Meds  Medication Sig  . alendronate (FOSAMAX) 70 MG tablet TAKE 1 TABLET BY MOUTH ONCE A WEEK WITH A FULL GLASS OF WATER. DO NOT LIE DOWN FOR THE NEXT 30 MINUTES  . ALPRAZolam (XANAX) 0.25 MG tablet Take 1 tablet (0.25 mg total) by mouth at bedtime as needed. for anxiety  . aspirin 500 MG tablet Take 500 mg by mouth daily.   . Biotin 24268 MCG TABS Take 10,000 mcg by mouth daily.   . Cetirizine HCl (ZYRTEC PO) Take 1 tablet by mouth daily.  . Cholecalciferol (VITAMIN D3) 1000 units CAPS Take 1,000 Units by mouth daily.   . Fluticasone Propionate (FLONASE NA) Place 1 spray into the nose daily.  [name] levothyroxine (SYNTHROID, LEVOTHROID) 88 MCG tablet TAKE 88 MCG BY MOUTH ONCE DAILY  . methylPREDNISolone (MEDROL) 4 MG TBPK tablet Take as directed     Allergies:   Meloxicam; Topamax [topiramate]; and Tramadol   Social History   Tobacco Use  . Smoking status: Never Smoker  .  Smokeless tobacco: Never Used  Substance Use Topics  . Alcohol use: Yes    Alcohol/week: 5.0 standard drinks    Types: 5 Standard drinks or equivalent per week    Comment: Occasional  . Drug use: No     Family Hx: The patient's family history includes Alzheimer's disease in her mother; Breast cancer (age of onset: 7355) in her sister; Cancer (age of onset: 9455) in her paternal aunt and sister; Hyperlipidemia in her father; Thyroid nodules in her maternal grandmother, mother, and sister.  ROS:   Please see the history of present illness.    All other systems reviewed and are negative.   Prior CV studies:   The following studies were reviewed today: Reviewed echos from 2018 and 2019  Labs/Other Tests and Data Reviewed:    EKG:  Not done as tele vi  Recent Labs: 02/17/2018: TSH 1.38   Recent Lipid  Panel Lab Results  Component Value Date/Time   CHOL 182 01/28/2017 09:00 AM   TRIG 60 01/28/2017 09:00 AM   HDL 87 01/28/2017 09:00 AM   CHOLHDL 2.1 01/28/2017 09:00 AM   LDLCALC 83 01/28/2017 09:00 AM   LDLDIRECT 129.4 01/02/2013 02:53 PM    Wt Readings from Last 3 Encounters:  01/01/19 145 lb (65.8 kg)  06/21/18 148 lb (67.1 kg)  02/17/18 143 lb 9.6 oz (65.1 kg)     Objective:    Vital Signs:  Ht 5\' 6"  (1.676 m)   Wt 145 lb (65.8 kg)   BMI 23.40 kg/m   Exam not dom  ASSESSMENT & PLAN:    1. AV disease   Pt has quadricuspid valve with mod AI   Would recomm repeat echo in August.   Based on results will set f/u at that time  2   HL  Lipids are excellent    COVID-19 Education: The signs and symptoms of COVID-19 were discussed with the patient and how to seek care for testing (follow up with PCP or arrange E-visit).  The importance of social distancing was discussed today.  Time:   Today, I have spent 15 minutes with the patient with telehealth technology discussing the above problems.     Medication Adjustments/Labs and Tests Ordered: Current medicines are reviewed at length with the patient today.  Concerns regarding medicines are outlined above.   Tests Ordered: No orders of the defined types were placed in this encounter.   Medication Changes: No orders of the defined types were placed in this encounter.   Disposition:  Follow up echo in August then f/u based on that   Signed, Dietrich PatesPaula Ross, MD  01/01/2019 3:27 PM    [facility]

## 2019-01-01 NOTE — Addendum Note (Signed)
Addended by: Lendon Ka on: 01/01/2019 04:34 PM   Modules accepted: Orders

## 2019-01-01 NOTE — Progress Notes (Signed)
We are sorry that you are not feeling well.  Here is how we plan to help!  Your symptoms indicate a likely viral infection (Pharyngitis).   Pharyngitis is inflammation in the back of the throat which can cause a sore throat, scratchiness and sometimes difficulty swallowing.   Pharyngitis is typically caused by a respiratory virus and will just run its course. Please keep in mind that your symptoms could last up to 10 days.  For throat pain, we recommend over the counter oral pain relief medications such as acetaminophen or aspirin, or anti-inflammatory medications such as ibuprofen or naproxen sodium.  Topical treatments such as oral throat lozenges or sprays may be used as needed.  Since you have been having a lot of drainage and is not responding to your allergy medications, I would also recommend you do saline nose rinses to help clear out the post nasal drainage which filters though the tonsils causing sometimes swelling of the tonsils( that is if you still have them) Watch this video who to do saline nose rinses. [email or web address]://www.bing.com/videos/search?q=youtube+neti+pot+video&&view=detail&[email or web address] Do not use tap water, only boiled water that has been cooled down.  Do it twice a day  for 5-7 days, but avoid bed time.  Also with a bacterial ear infection normally you get a fever, and when there is no fever it may be due to fluid behind the ear canal. I have seen ear nose and throat specialist prescribe a short course of prednisone to help, so I will send that for you.     Avoid close contact with loved ones, especially the very young and elderly.  Remember to wash your hands thoroughly throughout the day as this is the number one way to prevent the spread of infection and wipe down door knobs and counters with disinfectant.  After careful review of your answers, I would not recommend and antibiotic for your condition since you have not had a fever.   Antibiotics should not be used to treat conditions that we suspect are caused by viruses like the virus that causes the common cold or flu. However, some people can have Strep with atypical symptoms. You may need formal testing in clinic or office to confirm if your symptoms continue or worsen.  Providers prescribe antibiotics to treat infections caused by bacteria. Antibiotics are very powerful in treating bacterial infections when they are used properly.  To maintain their effectiveness, they should be used only when necessary.  Overuse of antibiotics has resulted in the development of super bugs that are resistant to treatment!    Home Care:  Only take medications as instructed by your medical team.  Do not drink alcohol while taking these medications.  A steam or ultrasonic humidifier can help congestion.  You can place a towel over your head and breathe in the steam from hot water coming from a faucet.  Avoid close contacts especially the very young and the elderly.  Cover your mouth when you cough or sneeze.  Always remember to wash your hands.  Get Help Right Away If:  You develop a fever of 100.4 or more or worse throat pain to the point you cant swallow your saliva. Or worse ear pain.   You develop a severe head ache or visual changes.  Your symptoms persist after you have completed your treatment plan.  Make sure you  Understand these instructions.  Will watch your condition.  Will get help right away if you are not doing well or get worse.  Your  e-visit answers were reviewed by a board certified advanced clinical practitioner to complete your personal care plan.  Depending on the condition, your plan could have included both over the counter or prescription medications.  If there is a problem please reply  once you have received a response from your provider.  Your safety is important to [place].  If you have drug allergies check your prescription carefully.    You can  use MyChart to ask questions about todays visit, request a non-urgent call back, or ask for a work or school excuse for 24 hours related to this e-Visit. If it has been greater than 24 hours you will need to follow up with your provider, or enter a new e-Visit to address those concerns.  You will get an e-mail in the next two days asking about your experience.  I hope that your e-visit has been valuable and will speed your recovery. Thank you for using e-visits.

## 2019-01-01 NOTE — Patient Instructions (Signed)
Medication Instructions:  No changes If you need a refill on your cardiac medications before your next appointment, please call your pharmacy.   Lab work: none If you have labs (blood work) drawn today and your tests are completely normal, you will receive your results only by: . MyChart Message (if you have MyChart) OR . A paper copy in the mail If you have any lab test that is abnormal or we need to change your treatment, we will call you to review the results.  Testing/Procedures: Your physician has requested that you have an echocardiogram. Echocardiography is a painless test that uses sound waves to create images of your heart. It provides your doctor with information about the size and shape of your heart and how well your heart's chambers and valves are working. This procedure takes approximately one hour. There are no restrictions for this procedure.   Follow-Up: Follow up with your physician will depend on test results.  Any Other Special Instructions Will Be Listed Below (If Applicable).    

## 2019-01-08 ENCOUNTER — Other Ambulatory Visit: Payer: Self-pay

## 2019-01-08 ENCOUNTER — Encounter: Payer: Self-pay | Admitting: Family Medicine

## 2019-01-08 ENCOUNTER — Ambulatory Visit (INDEPENDENT_AMBULATORY_CARE_PROVIDER_SITE_OTHER): Payer: No Typology Code available for payment source | Admitting: Family Medicine

## 2019-01-08 DIAGNOSIS — M792 Neuralgia and neuritis, unspecified: Secondary | ICD-10-CM | POA: Diagnosis not present

## 2019-01-08 DIAGNOSIS — B029 Zoster without complications: Secondary | ICD-10-CM

## 2019-01-08 MED ORDER — VALACYCLOVIR HCL 1 G PO TABS: 1000.0000 mg | ORAL_TABLET | Freq: Three times a day (TID) | ORAL | 0 refills | Status: DC

## 2019-01-08 MED ORDER — GABAPENTIN 100 MG PO CAPS: 100.0000 mg | ORAL_CAPSULE | Freq: Two times a day (BID) | ORAL | 3 refills | Status: DC

## 2019-01-08 NOTE — Progress Notes (Addendum)
Patient ID: Theresa PeonRebecca A [name], female   DOB: 1956-08-14, 63 y.o.   MRN: [id number]  Virtual Visit via video Note  This visit type was conducted due to national recommendations for restrictions regarding the COVID-19 pandemic (e.g. social distancing).  This format is felt to be most appropriate for this patient at this time.  All issues noted in this document were discussed and addressed.  No physical exam was performed (except for noted visual exam findings with Video Visits).   I connected with Theresa [name] on 01/08/19 at 10:40 AM EDT by a video enabled telemedicine application and verified that I am speaking with the correct person using two identifiers. Location patient: home Location provider: [facility] Persons participating in the virtual visit: patient, provider  I discussed the limitations, risks, security and privacy concerns of performing an evaluation and management service by video and the availability of in person appointments. I also discussed with the patient that there may be a patient responsible charge related to this service. The patient expressed understanding and agreed to proceed.   HPI:  Patient and I connected via video due to complaint of having a nerve type pain on top left side of head down behind into left ear.  Patient states his pain is been present for little over a week.  She did eat visit last week and it was suggested she could have possibly had fluid buildup behind the ear, patient does take oral antihistamine and Flonase every day and she was prescribed a oral steroid course.  Patient has since completed the steroid course, the nerve type pain on left side of head down into the back of ear has not seemed to improve much.  She continues to use her antihistamine and Flonase.  Patient is wondering if this nerve pain could possibly be related to a resurgence of shingles.  She did have shingles on her left ear 1 year ago.  States the pain feels similar to her  shingles, describes it as a burning sharp and tingling type sensation.  Denies one-sided facial weakness, denies speech difficulty, denies inability to blink or smile, denies visual changes, denies swallowing difficulty, denies one-sided extremity weakness, denies sharp thunderclap type pain in head. Denies any visible rash. Denies chest pain, shortness of breath or wheezing.  Denies GI or GU issues.  Denies fever or chills.   ROS: See pertinent positives and negatives per HPI.  Past Medical History:  Diagnosis Date  . Breast screening, unspecified   . H/O: Bell's palsy   . Headache   . Heart murmur   . History of kidney stones   . Hypercalcemia   . Osteoporosis   . Screening for malignant neoplasm of the cervix   . Special screening for malignant neoplasms, colon   . Unspecified hypothyroidism    thyroid nodule  . Unspecified otitis media     Past Surgical History:  Procedure Laterality Date  . ANTERIOR CERVICAL DECOMP/DISCECTOMY FUSION  13,15   C3 ,4 ,5 [facility]  . ANTERIOR FUSION CERVICAL SPINE  2013   [facility]  c5-c6  . CHOLECYSTECTOMY  1989  . dental implant    . MENISCUS REPAIR Right 01/2010   [facility]  . PARTIAL HYSTERECTOMY     secondary to fibroids  . SPINE SURGERY     [facility], C3, 4 5    Family History  Problem Relation Age of Onset  . Thyroid nodules Mother   . Alzheimer's disease Mother   . Hyperlipidemia Father   .  Cancer Paternal Aunt 81       breast cancer  . Thyroid nodules Sister   . Cancer Sister 16       in situ  . Breast cancer Sister 69  . Thyroid nodules Maternal Grandmother    Social History   Tobacco Use  . Smoking status: Never Smoker  . Smokeless tobacco: Never Used  Substance Use Topics  . Alcohol use: Yes    Alcohol/week: 5.0 standard drinks    Types: 5 Standard drinks or equivalent per week    Comment: Occasional    Current Outpatient Medications:  .  alendronate (FOSAMAX) 70 MG tablet, TAKE 1 TABLET BY MOUTH ONCE A WEEK WITH A  FULL GLASS OF WATER. DO NOT LIE DOWN FOR THE NEXT 30 MINUTES, Disp: , Rfl: 3 .  ALPRAZolam (XANAX) 0.25 MG tablet, Take 1 tablet (0.25 mg total) by mouth at bedtime as needed. for anxiety, Disp: 30 tablet, Rfl: 3 .  aspirin 500 MG tablet, Take 500 mg by mouth daily. , Disp: , Rfl:  .  Biotin 20355 MCG TABS, Take 10,000 mcg by mouth daily. , Disp: , Rfl:  .  Cetirizine HCl (ZYRTEC PO), Take 1 tablet by mouth daily., Disp: , Rfl:  .  Cholecalciferol (VITAMIN D3) 1000 units CAPS, Take 1,000 Units by mouth daily. , Disp: , Rfl:  .  Fluticasone Propionate (FLONASE NA), Place 1 spray into the nose daily., Disp: , Rfl:  .  levothyroxine (SYNTHROID, LEVOTHROID) 88 MCG tablet, TAKE 88 MCG BY MOUTH ONCE DAILY, Disp: 90 tablet, Rfl: 3 .  methylPREDNISolone (MEDROL) 4 MG TBPK tablet, Take as directed, Disp: 21 tablet, Rfl: 0  EXAM:  GENERAL: alert, oriented, appears well and in no acute distress  HEENT: atraumatic, conjunttiva clear, no obvious abnormalities on inspection of external nose and ears  NECK: normal movements of the head and neck  LUNGS: on inspection no signs of respiratory distress, breathing rate appears normal, no obvious gross SOB, gasping or wheezing  CV: no obvious cyanosis  MS: moves all visible extremities without noticeable abnormality  PSYCH/NEURO: pleasant and cooperative, no obvious depression or anxiety, speech and thought processing grossly intact  ASSESSMENT AND PLAN:  Discussed the following assessment and plan:  Nerve pain - Plan: gabapentin (NEURONTIN) 100 MG capsule, valACYclovir (VALTREX) 1000 MG tablet  Herpes zoster without complication - Plan: gabapentin (NEURONTIN) 100 MG capsule, valACYclovir (VALTREX) 1000 MG tablet  It is possible patient could be having a resurgence of shingles causing this nerve style type pain.  Advised to continue using her Zyrtec and Flonase due to the possibility of the ear pain being related to inflammation in ear from fluid  buildup.  We will also treat with a course of Valtrex 1000 mg 3 times daily for 7 days.  She will also use low-dose Neurontin 100 mg twice daily to see if this calms down nerve type pain.  Advised that if these medications do not help, she is to call back and let us know and most likely at that point we will recommend she be seen in person for evaluation.   I discussed the assessment and treatment plan with the patient. The patient was provided an opportunity to ask questions and all were answered. The patient agreed with the plan and demonstrated an understanding of the instructions.   The patient was advised to call back or seek an in-person evaluation if the symptoms worsen or if the condition fails to improve as anticipated.  Jodelle Green, FNP

## 2019-02-23 ENCOUNTER — Encounter: Payer: No Typology Code available for payment source | Admitting: Internal Medicine

## 2019-02-27 LAB — BASIC METABOLIC PANEL: Creatinine: 0.9 (ref 0.5–1.1)

## 2019-03-01 ENCOUNTER — Other Ambulatory Visit: Payer: Self-pay | Admitting: Neurosurgery

## 2019-03-01 DIAGNOSIS — Z981 Arthrodesis status: Secondary | ICD-10-CM

## 2019-03-02 ENCOUNTER — Ambulatory Visit
Admission: RE | Admit: 2019-03-02 | Discharge: 2019-03-02 | Disposition: A | Discharge status: Home / Self Care | Payer: No Typology Code available for payment source | Source: Ambulatory Visit | Attending: Neurosurgery | Admitting: Neurosurgery

## 2019-03-02 ENCOUNTER — Other Ambulatory Visit: Payer: Self-pay

## 2019-03-02 DIAGNOSIS — Z981 Arthrodesis status: Secondary | ICD-10-CM

## 2019-04-09 ENCOUNTER — Other Ambulatory Visit: Payer: Self-pay

## 2019-04-11 ENCOUNTER — Encounter: Payer: Self-pay | Admitting: Internal Medicine

## 2019-04-11 ENCOUNTER — Other Ambulatory Visit: Payer: Self-pay | Admitting: Internal Medicine

## 2019-04-11 ENCOUNTER — Ambulatory Visit (INDEPENDENT_AMBULATORY_CARE_PROVIDER_SITE_OTHER): Payer: No Typology Code available for payment source | Admitting: Internal Medicine

## 2019-04-11 ENCOUNTER — Other Ambulatory Visit: Payer: Self-pay

## 2019-04-11 VITALS — BP 134/76 | HR 87 | Temp 98.2°F | Resp 14 | Ht 66.0 in | Wt 150.0 lb

## 2019-04-11 DIAGNOSIS — E034 Atrophy of thyroid (acquired): Secondary | ICD-10-CM | POA: Diagnosis not present

## 2019-04-11 DIAGNOSIS — M4722 Other spondylosis with radiculopathy, cervical region: Secondary | ICD-10-CM | POA: Diagnosis not present

## 2019-04-11 DIAGNOSIS — Z0000 Encounter for general adult medical examination without abnormal findings: Secondary | ICD-10-CM

## 2019-04-11 DIAGNOSIS — E040 Nontoxic diffuse goiter: Secondary | ICD-10-CM | POA: Diagnosis not present

## 2019-04-11 DIAGNOSIS — Z0001 Encounter for general adult medical examination with abnormal findings: Secondary | ICD-10-CM

## 2019-04-11 DIAGNOSIS — M48062 Spinal stenosis, lumbar region with neurogenic claudication: Secondary | ICD-10-CM

## 2019-04-11 DIAGNOSIS — M85851 Other specified disorders of bone density and structure, right thigh: Secondary | ICD-10-CM | POA: Diagnosis not present

## 2019-04-11 DIAGNOSIS — E010 Iodine-deficiency related diffuse (endemic) goiter: Secondary | ICD-10-CM

## 2019-04-11 DIAGNOSIS — I359 Nonrheumatic aortic valve disorder, unspecified: Secondary | ICD-10-CM

## 2019-04-11 MED ORDER — ALPRAZOLAM 0.25 MG PO TABS: 0.2500 mg | ORAL_TABLET | Freq: Every evening | ORAL | 3 refills | Status: DC | PRN

## 2019-04-11 NOTE — Progress Notes (Signed)
Patient ID: LISAMARIE COKE, female    DOB: 1956-03-13  Age: 63 y.o. MRN: [id number]  The patient is here for annual PREVENTIVE  examination and management of other chronic and acute problems.   NORMAL MAMMOGRAM DEC 2019 AORTIC VALVE INSUFfICIENCY 2019 ECHO  Repeat not done   colonoscopy 2018 per patient   The risk factors are reflected in the social history.  The roster of all physicians providing medical care to patient - is listed in the Snapshot section of the chart.  Activities of daily living:  The patient is 100% independent in all ADLs: dressing, toileting, feeding as well as independent mobility  Home safety : The patient has smoke detectors in the home. They wear seatbelts.  There are no firearms at home. There is no violence in the home.   There is no risks for hepatitis, STDs or HIV. There is no   history of blood transfusion. They have no travel history to infectious disease endemic areas of the world.  The patient has seen their dentist in the last six month. They have seen their eye doctor in the last year. They admit to slight hearing difficulty with regard to whispered voices and some television programs.  They have deferred audiologic testing in the last year.  They do not  have excessive sun exposure. Discussed the need for sun protection: hats, long sleeves and use of sunscreen if there is significant sun exposure.   Diet: the importance of a healthy diet is discussed. They do have a healthy diet.  The benefits of regular aerobic exercise were discussed. She walks 4 times per week ,  20 minutes.   Depression screen: there are no signs or vegative symptoms of depression- irritability, change in appetite, anhedonia, sadness/tearfullness.  Cognitive assessment: the patient manages all their financial and personal affairs and is actively engaged. They could relate day,date,year and events; recalled 2/3 objects at 3 minutes; performed clock-face test normally.  The following  portions of the patient's history were reviewed and updated as appropriate: allergies, current medications, past family history, past medical history,  past surgical history, past social history  and problem list.  Visual acuity was not assessed per patient preference since she has regular follow up with her ophthalmologist. Hearing and body mass index were assessed and reviewed.   During the course of the visit the patient was educated and counseled about appropriate screening and preventive services including : fall prevention , diabetes screening, nutrition counseling, colorectal cancer screening, and recommended immunizations.    CC: The primary encounter diagnosis was Encounter for preventive health examination. Diagnoses of Hypothyroidism due to acquired atrophy of thyroid, Goiter, simple, Osteopenia of right hip, Osteoarthritis of spine with radiculopathy, cervical region, Aortic valve disease, Lumbar stenosis with neurogenic claudication, Thyromegaly, and Encounter for general adult medical examination with abnormal findings were also pertinent to this visit.   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 .   Had menopause symptoms with evista for 1.5  Years never got better . FH of osteoporosis  History Lurena JoinerRebecca has a past medical history of Breast screening, unspecified, H/O: Bell's palsy, Headache, Heart murmur, History of kidney stones, Hypercalcemia, Osteoporosis, Screening for malignant neoplasm of the cervix, Special screening for malignant neoplasms, colon, Unspecified  hypothyroidism, and Unspecified otitis media.   She has a past surgical history that includes Partial hysterectomy; Meniscus repair (Right, 01/2010); Cholecystectomy (1989); dental implant; Anterior fusion cervical spine (2013); Spine surgery; and Anterior cervical decomp/discectomy fusion (13,15).   Her family history includes Alzheimer's disease in her mother; Breast cancer (age of onset: 1655) in her sister; Cancer (age of onset: 2655) in her paternal aunt and sister; Hyperlipidemia in her father; Thyroid nodules in her maternal grandmother, mother, and sister.She reports that she has never smoked. She has never used smokeless tobacco. She reports current alcohol use of about 5.0 standard drinks of alcohol per week. She reports that she does not use drugs.  Outpatient Medications Prior to Visit  Medication Sig Dispense Refill  . aspirin 500 MG tablet Take 500 mg by mouth daily.     . Biotin [id number] MCG TABS Take 10,000 mcg by mouth daily.     . Cetirizine HCl (ZYRTEC PO) Take 1 tablet by mouth daily.    . Cholecalciferol (VITAMIN D3) 1000 units CAPS Take 1,000 Units by mouth daily.     . Fluticasone Propionate (FLONASE NA) Place 1 spray into the nose daily.    [name]. gabapentin (NEURONTIN) 100 MG capsule Take 1 capsule (100 mg total) by mouth 2 (two) times daily. 60 capsule 3  . levothyroxine (SYNTHROID, LEVOTHROID) 88 MCG tablet TAKE 88 MCG BY MOUTH ONCE DAILY 90 tablet 3  . Magnesium 250 MG TABS Take 1 tablet by mouth daily.     . Melatonin 10 MG TABS Take 2 tablets by mouth at bedtime.     . methylPREDNISolone (MEDROL) 4 MG TBPK tablet Take as directed 21 tablet 0  . valACYclovir (VALTREX) 1000 MG tablet Take 1 tablet (1,000 mg total) by mouth 3 (three) times daily. 21 tablet 0  . ALPRAZolam (XANAX) 0.25 MG tablet Take 1 tablet (0.25 mg total) by mouth at bedtime as needed. for anxiety 30 tablet 3  . alendronate (FOSAMAX) 70 MG tablet TAKE 1 TABLET BY MOUTH ONCE A WEEK WITH A FULL GLASS OF WATER. DO  NOT LIE DOWN FOR THE NEXT 30 MINUTES  3   No facility-administered medications prior to visit.     Review of Systems   Patient denies headache, fevers, malaise, unintentional weight loss, skin rash, eye pain, sinus congestion and sinus pain, sore throat, dysphagia,  hemoptysis , cough, dyspnea, wheezing, chest pain, palpitations, orthopnea, edema, abdominal pain, nausea, melena, diarrhea, constipation, flank pain, dysuria, hematuria, urinary  Frequency, nocturia, numbness, tingling, seizures,  Focal weakness, Loss of consciousness,  Tremor, insomnia, depression, anxiety, and suicidal ideation.      Objective:  BP 134/76 (BP Location: Left Arm, Patient Position: Sitting, Cuff Size: Normal)   Pulse 87   Temp 98.2 F (36.8 C) (Oral)   Resp 14   Ht 5\' 6"  (1.676 m)   Wt 150 lb (68 kg)   SpO2 96%   BMI 24.21 kg/m   Physical Exam  General appearance: alert, cooperative and appears stated age Head: Normocephalic, without obvious abnormality, atraumatic Eyes: conjunctivae/corneas clear. PERRL, EOM's intact. Fundi benign. Ears: normal TM's and external ear canals both ears Nose: Nares normal. Septum midline. Mucosa normal. No drainage or sinus tenderness. Throat: lips, mucosa, and tongue normal; teeth and gums normal  Neck: no adenopathy, no carotid bruit, no JVD, supple, symmetrical, trachea midline and thyroid not enlarged, symmetric, no tenderness/mass/nodules Lungs: clear to auscultation bilaterally Breasts: normal appearance, no masses or tenderness Heart: regular rate and rhythm, S1, S2 normal, no murmur, click, rub or gallop Abdomen: soft, non-tender; bowel sounds normal; no masses,  no organomegaly Extremities: extremities normal, atraumatic, no cyanosis or edema Pulses: 2+ and symmetric Skin: Skin color, texture, turgor normal. No rashes or lesions Neurologic: Alert and oriented X 3, normal strength and tone. Normal symmetric reflexes. Normal coordination and gait.      Assessment & Plan:   Problem List Items Addressed This Visit      Unprioritized   Thyromegaly    She has an enlarged right thyroid lobe by previous ultrasound and is due for repeat evaluation       Lumbar stenosis with neurogenic claudication    S/p  decompressive surgery on L3-L4 .        Hypothyroidism    Thyroid function is due.   Lab Results  Component Value Date   TSH 1.38 02/17/2018         Relevant Orders   TSH   US Soft Tissue Head/Neck   Encounter for general adult medical examination with abnormal findings    age appropriate education and counseling updated, referrals for preventative services and immunizations addressed, dietary and smoking counseling addressed, most recent labs reviewed.  I have personally reviewed and have noted:  1) the patient's medical and social history 2) The pt's use of alcohol, tobacco, and illicit drugs 3) The patient's current medications and supplements 4) Functional ability including ADL's, fall risk, home safety risk, hearing and visual impairment 5) Diet and physical activities 6) Evidence for depression or mood disorder 7) The patient's height, weight, and BMI have been recorded in the chart  I have made referrals, and provided counseling and education based on review of the above      Cervical spine degeneration    Now with radiculopathy to hands.  Does not tolerate tramadol  .  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  She is asymptomatic and is reminded to have annual surveillance with cardiology.       Osteopenia   Relevant Orders   DG Bone Density    Other Visit Diagnoses    Encounter for preventive health examination    -  Primary   Goiter, simple       Relevant Orders   US Soft Tissue Head/Neck      I have discontinued Kensly A. Linskey "Becky"'s alendronate. I am also having her maintain her Biotin, Vitamin D3, aspirin,  levothyroxine, methylPREDNISolone, Fluticasone Propionate (FLONASE NA), Cetirizine HCl (ZYRTEC PO), gabapentin, valACYclovir, Melatonin, and Magnesium.  No orders of the defined types were placed in this encounter.   Medications Discontinued During This Encounter  Medication Reason  . alendronate (FOSAMAX) 70 MG tablet Patient Preference    Follow-up: No follow-ups on file.   Sherlene Shamseresa L Shantinique Picazo, MD

## 2019-04-11 NOTE — Patient Instructions (Signed)
Suspend biotin from now until after your thyroid test   Thyroid ultrasound ordered   dexa ordered  Alprazolam refilled   Continue 800 to 1000 Ius of D3 daily,  2000 ius daily [facility]    Health Maintenance for Postmenopausal Women Menopause is a normal process in which your ability to get pregnant comes to an end. This process happens slowly over many months or years, usually between the ages of 8048 and 4455. Menopause is complete when you have missed your menstrual periods for 12 months. It is important to talk with your health care provider about some of the most common conditions that affect women after menopause (postmenopausal women). These include heart disease, cancer, and bone loss (osteoporosis). Adopting a healthy lifestyle and getting preventive care can help to promote your health and wellness. The actions you take can also lower your chances of developing some of these common conditions. What should I know about menopause? During menopause, you may get a number of symptoms, such as:  Hot flashes. These can be moderate or severe.  Night sweats.  Decrease in sex drive.  Mood swings.  Headaches.  Tiredness.  Irritability.  Memory problems.  Insomnia. Choosing to treat or not to treat these symptoms is a decision that you make with your health care provider. Do I need hormone replacement therapy?  Hormone replacement therapy is effective in treating symptoms that are caused by menopause, such as hot flashes and night sweats.  Hormone replacement carries certain risks, especially as you become older. If you are thinking about using estrogen or estrogen with progestin, discuss the benefits and risks with your health care provider. What is my risk for heart disease and stroke? The risk of heart disease, heart attack, and stroke increases as you age. One of the causes may be a change in the body's hormones during menopause. This can affect how your body uses  dietary fats, triglycerides, and cholesterol. Heart attack and stroke are medical emergencies. There are many things that you can do to help prevent heart disease and stroke. Watch your blood pressure  High blood pressure causes heart disease and increases the risk of stroke. This is more likely to develop in people who have high blood pressure readings, are of African descent, or are overweight.  Have your blood pressure checked: ? Every 3-5 years if you are 2318-61 years of age. ? Every year if you are 61 years old or older. Eat a healthy diet   Eat a diet that includes plenty of vegetables, fruits, low-fat dairy products, and lean protein.  Do not eat a lot of foods that are high in solid fats, added sugars, or sodium. Get regular exercise Get regular exercise. This is one of the most important things you can do for your health. Most adults should:  Try to exercise for at least 150 minutes each week. The exercise should increase your heart rate and make you sweat (moderate-intensity exercise).  Try to do strengthening exercises at least twice each week. Do these in addition to the moderate-intensity exercise.  Spend less time sitting. Even light physical activity can be beneficial. Other tips  Work with your health care provider to achieve or maintain a healthy weight.  Do not use any products that contain nicotine or tobacco, such as cigarettes, e-cigarettes, and chewing tobacco. If you need help quitting, ask your health care provider.  Know your numbers. Ask your health care provider to check your cholesterol and your blood sugar (glucose).  Continue to have your blood tested as directed by your health care provider. Do I need screening for cancer? Depending on your health history and family history, you may need to have cancer screening at different stages of your life. This may include screening for:  Breast cancer.  Cervical cancer.  Lung cancer.  Colorectal cancer. What  is my risk for osteoporosis? After menopause, you may be at increased risk for osteoporosis. Osteoporosis is a condition in which bone destruction happens more quickly than new bone creation. To help prevent osteoporosis or the bone fractures that can happen because of osteoporosis, you may take the following actions:  If you are 59-84 years old, get at least 1,000 mg of calcium and at least 600 mg of vitamin D per day.  If you are older than age 56 but younger than age 28, get at least 1,200 mg of calcium and at least 600 mg of vitamin D per day.  If you are older than age 30, get at least 1,200 mg of calcium and at least 800 mg of vitamin D per day. Smoking and drinking excessive alcohol increase the risk of osteoporosis. Eat foods that are rich in calcium and vitamin D, and do weight-bearing exercises several times each week as directed by your health care provider. How does menopause affect my mental health? Depression may occur at any age, but it is more common as you become older. Common symptoms of depression include:  Low or sad mood.  Changes in sleep patterns.  Changes in appetite or eating patterns.  Feeling an overall lack of motivation or enjoyment of activities that you previously enjoyed.  Frequent crying spells. Talk with your health care provider if you think that you are experiencing depression. General instructions See your health care provider for regular wellness exams and vaccines. This may include:  Scheduling regular health, dental, and eye exams.  Getting and maintaining your vaccines. These include: ? Influenza vaccine. Get this vaccine each year before the flu season begins. ? Pneumonia vaccine. ? Shingles vaccine. ? Tetanus, diphtheria, and pertussis (Tdap) booster vaccine. Your health care provider may also recommend other immunizations. Tell your health care provider if you have ever been abused or do not feel safe at home. Summary  Menopause is a  normal process in which your ability to get pregnant comes to an end.  This condition causes hot flashes, night sweats, decreased interest in sex, mood swings, headaches, or lack of sleep.  Treatment for this condition may include hormone replacement therapy.  Take actions to keep yourself healthy, including exercising regularly, eating a healthy diet, watching your weight, and checking your blood pressure and blood sugar levels.  Get screened for cancer and depression. Make sure that you are up to date with all your vaccines. This information is not intended to replace advice given to you by your health care provider. Make sure you discuss any questions you have with your health care provider. Document Released: 10/22/2005 Document Revised: 08/23/2018 Document Reviewed: 08/23/2018 Elsevier Patient Education  2020 [employer].

## 2019-04-12 NOTE — Assessment & Plan Note (Signed)
Thyroid function is due.   Lab Results  Component Value Date   TSH 1.38 02/17/2018

## 2019-04-12 NOTE — Assessment & Plan Note (Addendum)
S/p  decompressive surgery on L3-L4 .

## 2019-04-12 NOTE — Assessment & Plan Note (Signed)

## 2019-04-12 NOTE — Assessment & Plan Note (Signed)
Secondary to congenital 4 leaf valve.  She is asymptomatic and is reminded to have annual surveillance with cardiology.

## 2019-04-12 NOTE — Assessment & Plan Note (Signed)
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  Does not tolerate tramadol  .  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

## 2019-04-12 NOTE — Assessment & Plan Note (Signed)
She has an enlarged right thyroid lobe by previous ultrasound and is due for repeat evaluation

## 2019-04-16 ENCOUNTER — Other Ambulatory Visit: Payer: Self-pay

## 2019-04-16 ENCOUNTER — Other Ambulatory Visit (INDEPENDENT_AMBULATORY_CARE_PROVIDER_SITE_OTHER): Payer: No Typology Code available for payment source

## 2019-04-16 DIAGNOSIS — E034 Atrophy of thyroid (acquired): Secondary | ICD-10-CM | POA: Diagnosis not present

## 2019-04-17 LAB — TSH: TSH: 1.93 mIU/L (ref 0.40–4.50)

## 2019-04-18 ENCOUNTER — Other Ambulatory Visit: Payer: Self-pay

## 2019-04-18 ENCOUNTER — Ambulatory Visit
Admission: RE | Admit: 2019-04-18 | Discharge: 2019-04-18 | Disposition: A | Discharge status: Home / Self Care | Payer: No Typology Code available for payment source | Source: Ambulatory Visit | Attending: Internal Medicine | Admitting: Internal Medicine

## 2019-04-18 DIAGNOSIS — E040 Nontoxic diffuse goiter: Secondary | ICD-10-CM | POA: Insufficient documentation

## 2019-04-18 DIAGNOSIS — E034 Atrophy of thyroid (acquired): Secondary | ICD-10-CM | POA: Insufficient documentation

## 2019-04-18 MED ORDER — LEVOTHYROXINE SODIUM 88 MCG PO TABS: ORAL_TABLET | ORAL | 1 refills | Status: DC

## 2019-04-19 ENCOUNTER — Encounter: Payer: Self-pay | Admitting: Internal Medicine

## 2019-04-23 ENCOUNTER — Other Ambulatory Visit: Payer: Self-pay | Admitting: Internal Medicine

## 2019-04-23 DIAGNOSIS — Z1231 Encounter for screening mammogram for malignant neoplasm of breast: Secondary | ICD-10-CM

## 2019-05-04 ENCOUNTER — Other Ambulatory Visit: Payer: Self-pay

## 2019-05-04 ENCOUNTER — Ambulatory Visit (HOSPITAL_COMMUNITY): Payer: No Typology Code available for payment source | Attending: Cardiovascular Disease

## 2019-05-04 DIAGNOSIS — I359 Nonrheumatic aortic valve disorder, unspecified: Secondary | ICD-10-CM | POA: Insufficient documentation

## 2019-05-08 ENCOUNTER — Telehealth: Payer: Self-pay

## 2019-05-08 ENCOUNTER — Telehealth: Payer: Self-pay | Admitting: *Deleted

## 2019-05-08 DIAGNOSIS — I359 Nonrheumatic aortic valve disorder, unspecified: Secondary | ICD-10-CM

## 2019-05-08 NOTE — Telephone Encounter (Signed)
-----   Message from Fay Records, MD sent at 05/05/2019 10:53 PM EDT ----- Echo unchanged. Pumping function of the heart is normal AV has moderate insufficiency Set f/u for 1 year with echo

## 2019-05-08 NOTE — Telephone Encounter (Signed)
Copied from [place]. Topic: Quick Communication - See Telephone Encounter >> May 08, 2019  8:29 AM Loma Boston wrote: CRM for notification. See Telephone encounter for: 05/08/19.7/29 pt was charged for a preventive visit and and out of office pt visit. Please fu with pt ASAP at [phone]. Billing referred back to office

## 2019-05-08 NOTE — Telephone Encounter (Signed)
Notes recorded by Michae Kava, CMA on 05/08/2019 at 2:34 PM EDT  Pt has been notified of echo results by phone with verbal understanding. Pt thanked me for the call. Patient notified of result. Please refer to phone note from today for complete details.   Julaine Hua, [facility] 05/08/2019 2:34 PM

## 2019-05-16 NOTE — Telephone Encounter (Signed)
I called the patient and explained that during her well visit that other acute issues were addressed. Therefore, she was billed a co- pay for the visit.  The patient did not agree with the practice. However, she stated she will pay her bill.

## 2019-09-03 ENCOUNTER — Ambulatory Visit
Admission: RE | Admit: 2019-09-03 | Discharge: 2019-09-03 | Disposition: A | Discharge status: Home / Self Care | Payer: No Typology Code available for payment source | Source: Ambulatory Visit | Attending: Internal Medicine | Admitting: Internal Medicine

## 2019-09-03 DIAGNOSIS — Z1231 Encounter for screening mammogram for malignant neoplasm of breast: Secondary | ICD-10-CM | POA: Diagnosis present

## 2019-09-03 DIAGNOSIS — M85851 Other specified disorders of bone density and structure, right thigh: Secondary | ICD-10-CM

## 2019-09-26 ENCOUNTER — Other Ambulatory Visit: Payer: Self-pay | Admitting: Internal Medicine

## 2019-09-26 DIAGNOSIS — B029 Zoster without complications: Secondary | ICD-10-CM

## 2019-09-26 DIAGNOSIS — M792 Neuralgia and neuritis, unspecified: Secondary | ICD-10-CM

## 2019-09-26 MED ORDER — GABAPENTIN 100 MG PO CAPS: 100.0000 mg | ORAL_CAPSULE | Freq: Three times a day (TID) | ORAL | 5 refills | Status: DC

## 2019-10-23 ENCOUNTER — Other Ambulatory Visit: Payer: Self-pay | Admitting: Internal Medicine

## 2019-10-24 DIAGNOSIS — E034 Atrophy of thyroid (acquired): Secondary | ICD-10-CM

## 2019-10-24 DIAGNOSIS — Z79899 Other long term (current) drug therapy: Secondary | ICD-10-CM

## 2019-10-24 MED ORDER — LEVOTHYROXINE SODIUM 88 MCG PO TABS: ORAL_TABLET | ORAL | 0 refills | Status: DC

## 2019-10-24 NOTE — Telephone Encounter (Signed)
Levothyroxine has been refilled for 90 days only because she is due to have her thyroid level rechecked.

## 2019-10-25 MED ORDER — ALPRAZOLAM 0.25 MG PO TABS: 0.2500 mg | ORAL_TABLET | Freq: Every evening | ORAL | 3 refills | Status: DC | PRN

## 2019-12-14 ENCOUNTER — Other Ambulatory Visit: Payer: Self-pay

## 2019-12-14 ENCOUNTER — Ambulatory Visit: Payer: No Typology Code available for payment source | Attending: Internal Medicine

## 2019-12-14 DIAGNOSIS — Z23 Encounter for immunization: Secondary | ICD-10-CM

## 2019-12-14 NOTE — Progress Notes (Signed)
   Covid-19 Vaccination Clinic  Name:  ANELY SPIEWAK    MRN: [id number] DOB: 1956/03/23  12/14/2019  Ms. Poche was observed post Covid-19 immunization for 15 minutes without incident. She was provided with Vaccine Information Sheet and instruction to access the V-Safe system.   Ms. Noguera was instructed to call 911 with any severe reactions post vaccine: [name] Difficulty breathing  . Swelling of face and throat  . A fast heartbeat  . A bad rash all over body  . Dizziness and weakness   Immunizations Administered    Name Date Dose VIS Date Route   Pfizer COVID-19 Vaccine 12/14/2019 11:25 AM 0.3 mL 08/24/2019 Intramuscular   Manufacturer: [employer], [employer]   Lot: [id number]   NDC: [address]-6

## 2019-12-27 ENCOUNTER — Other Ambulatory Visit (INDEPENDENT_AMBULATORY_CARE_PROVIDER_SITE_OTHER): Payer: No Typology Code available for payment source

## 2019-12-27 ENCOUNTER — Other Ambulatory Visit: Payer: Self-pay

## 2019-12-27 DIAGNOSIS — E034 Atrophy of thyroid (acquired): Secondary | ICD-10-CM

## 2019-12-27 DIAGNOSIS — Z79899 Other long term (current) drug therapy: Secondary | ICD-10-CM

## 2019-12-27 LAB — TSH: TSH: 2.41 mIU/L (ref 0.40–4.50)

## 2019-12-27 NOTE — Addendum Note (Signed)
Addended by: Warden Fillers on: 12/27/2019 08:13 AM   Modules accepted: Orders

## 2019-12-28 LAB — COMPREHENSIVE METABOLIC PANEL
AG Ratio: 2.3 (calc) (ref 1.0–2.5)
ALT: 23 U/L (ref 6–29)
AST: 29 U/L (ref 10–35)
Albumin: 4.3 g/dL (ref 3.6–5.1)
Alkaline phosphatase (APISO): 64 U/L (ref 37–153)
BUN: 14 mg/dL (ref 7–25)
CO2: 27 mmol/L (ref 20–32)
Calcium: 9.6 mg/dL (ref 8.6–10.4)
Chloride: 101 mmol/L (ref 98–110)
Creat: 0.85 mg/dL (ref 0.50–0.99)
Globulin: 1.9 g/dL (calc) (ref 1.9–3.7)
Glucose, Bld: 108 mg/dL — ABNORMAL HIGH (ref 65–99)
Potassium: 4.3 mmol/L (ref 3.5–5.3)
Sodium: 138 mmol/L (ref 135–146)
Total Bilirubin: 0.4 mg/dL (ref 0.2–1.2)
Total Protein: 6.2 g/dL (ref 6.1–8.1)

## 2020-01-09 ENCOUNTER — Ambulatory Visit: Payer: No Typology Code available for payment source | Attending: Internal Medicine

## 2020-01-09 DIAGNOSIS — Z23 Encounter for immunization: Secondary | ICD-10-CM

## 2020-01-09 NOTE — Progress Notes (Signed)
   Covid-19 Vaccination Clinic  Name:  KINDRA BICKHAM    MRN: [id number] DOB: [birth year]  01/09/2020  Ms. Mcvey was observed post Covid-19 immunization for 15 minutes without incident. She was provided with Vaccine Information Sheet and instruction to access the V-Safe system.   Ms. Sweatt was instructed to call 911 with any severe reactions post vaccine: [name] Difficulty breathing  . Swelling of face and throat  . A fast heartbeat  . A bad rash all over body  . Dizziness and weakness   Immunizations Administered    Name Date Dose VIS Date Route   Pfizer COVID-19 Vaccine 01/09/2020 12:04 PM 0.3 mL 11/07/2018 Intramuscular   Manufacturer: [employer], [employer]   Lot: [id number]   NDC: [address]-9

## 2020-01-18 ENCOUNTER — Other Ambulatory Visit: Payer: Self-pay

## 2020-01-18 ENCOUNTER — Encounter: Payer: Self-pay | Admitting: Internal Medicine

## 2020-01-18 ENCOUNTER — Telehealth (INDEPENDENT_AMBULATORY_CARE_PROVIDER_SITE_OTHER): Payer: No Typology Code available for payment source | Admitting: Internal Medicine

## 2020-01-18 ENCOUNTER — Other Ambulatory Visit: Payer: No Typology Code available for payment source

## 2020-01-18 VITALS — Ht 66.0 in | Wt 150.0 lb

## 2020-01-18 DIAGNOSIS — N3090 Cystitis, unspecified without hematuria: Secondary | ICD-10-CM

## 2020-01-18 DIAGNOSIS — R300 Dysuria: Secondary | ICD-10-CM

## 2020-01-18 MED ORDER — SULFAMETHOXAZOLE-TRIMETHOPRIM 800-160 MG PO TABS: 1.0000 | ORAL_TABLET | Freq: Two times a day (BID) | ORAL | 0 refills | Status: DC

## 2020-01-18 MED ORDER — CIPROFLOXACIN HCL 250 MG PO TABS: 250.0000 mg | ORAL_TABLET | Freq: Two times a day (BID) | ORAL | 0 refills | Status: DC

## 2020-01-18 NOTE — Progress Notes (Signed)
Virtual Visit via [place]  This visit type was conducted due to national recommendations for restrictions regarding the COVID-19 pandemic (e.g. social distancing).  This format is felt to be most appropriate for this patient at this time.  All issues noted in this document were discussed and addressed.  No physical exam was performed (except for noted visual exam findings with Video Visits).   I connected with@ on 01/21/20 at 11:30 AM EDT by a video enabled telemedicine application  and verified that I am speaking with the correct person using two identifiers. Location patient: home Location provider: work or home office Persons participating in the virtual visit: patient, provider  I discussed the limitations, risks, security and privacy concerns of performing an evaluation and management service by telephone and the availability of in person appointments. I also discussed with the patient that there may be a patient responsible charge related to this service. The patient expressed understanding and agreed to proceed.  Reason for visit: possible UTI  HPI:   One day history  Of "feeling rough",  Back hurting more than usual  (has chronic back pain),  Then developed  suprapubic  pain,  Dysuria , and trouble urinating at 7 pm last night.  Increased water intake for the last 12 hours and feeling somewhat better.  No recent (ever) UTI, no recent diarrhea.  No recent sexual encounter.   ROS: Patient denies headache, fevers, malaise, unintentional weight loss, skin rash, eye pain, sinus congestion and sinus pain, sore throat, dysphagia,  hemoptysis , cough, dyspnea, wheezing, chest pain, palpitations, orthopnea, edema, , nausea, melena,, constipation, flank pain,hematuria,  numbness, tingling, seizures,  Focal weakness, Loss of consciousness,  Tremor, insomnia, depression, anxiety, and suicidal ideation.      Past Medical History:  Diagnosis Date  . Breast screening, unspecified   . H/O: Bell's  palsy   . Headache   . Heart murmur   . History of kidney stones   . Hypercalcemia   . Osteoporosis   . Screening for malignant neoplasm of the cervix   . Special screening for malignant neoplasms, colon   . Unspecified hypothyroidism    thyroid nodule  . Unspecified otitis media     Past Surgical History:  Procedure Laterality Date  . ANTERIOR CERVICAL DECOMP/DISCECTOMY FUSION  13,15   C3 ,4 ,5 [facility]  . ANTERIOR FUSION CERVICAL SPINE  2013   [facility]  c5-c6  . CHOLECYSTECTOMY  1989  . dental implant    . MENISCUS REPAIR Right 01/2010   [facility]  . PARTIAL HYSTERECTOMY     secondary to fibroids  . SPINE SURGERY     [facility], C3, 4 5    Family History  Problem Relation Age of Onset  . Thyroid nodules Mother   . Alzheimer's disease Mother   . Hyperlipidemia Father   . Cancer Paternal Aunt 13       breast cancer  . Thyroid nodules Sister   . Cancer Sister 38       in situ  . Breast cancer Sister 11  . Thyroid nodules Maternal Grandmother     SOCIAL HX:  reports that she has never smoked. She has never used smokeless tobacco. She reports current alcohol use of about 5.0 standard drinks of alcohol per week. She reports that she does not use drugs.   Current Outpatient Medications:  .  ALPRAZolam (XANAX) 0.25 MG tablet, Take 1 tablet (0.25 mg total) by mouth at bedtime as needed. for anxiety, Disp: 30 tablet,  Rfl: 3 .  aspirin 500 MG tablet, Take 500 mg by mouth daily. , Disp: , Rfl:  .  Biotin 04540 MCG TABS, Take 10,000 mcg by mouth daily. , Disp: , Rfl:  .  Cetirizine HCl (ZYRTEC PO), Take 1 tablet by mouth daily., Disp: , Rfl:  .  Cholecalciferol (VITAMIN D3) 1000 units CAPS, Take 1,000 Units by mouth daily. , Disp: , Rfl:  .  Fluticasone Propionate (FLONASE NA), Place 1 spray into the nose daily., Disp: , Rfl:  .  gabapentin (NEURONTIN) 100 MG capsule, Take 1 capsule (100 mg total) by mouth 3 (three) times daily., Disp: 90 capsule, Rfl: 5 .  levothyroxine (SYNTHROID)  88 MCG tablet, Take 1 tablet by mouth once daily, Disp: 90 tablet, Rfl: 0 .  Melatonin 10 MG TABS, Take 2 tablets by mouth at bedtime. , Disp: , Rfl:  .  ciprofloxacin (CIPRO) 250 MG tablet, Take 1 tablet (250 mg total) by mouth 2 (two) times daily., Disp: 6 tablet, Rfl: 0 .  sulfamethoxazole-trimethoprim (BACTRIM DS) 800-160 MG tablet, Take 1 tablet by mouth 2 (two) times daily., Disp: 6 tablet, Rfl: 0  EXAM:  VITALS per patient if applicable:  GENERAL: alert, oriented, appears well and in no acute distress  HEENT: atraumatic, conjunttiva clear, no obvious abnormalities on inspection of external nose and ears  NECK: normal movements of the head and neck  LUNGS: on inspection no signs of respiratory distress, breathing rate appears normal, no obvious gross SOB, gasping or wheezing  CV: no obvious cyanosis  MS: moves all visible extremities without noticeable abnormality  PSYCH/NEURO: pleasant and cooperative, no obvious depression or anxiety, speech and thought processing grossly intact  ASSESSMENT AND PLAN:  Discussed the following assessment and plan:  Dysuria - Plan: POCT Urinalysis Dipstick, Urine Culture, Urine Microscopic Only  Cystitis  Cystitis Empiric treatment started with cipro/septra .  Urine culture grew Staph saprophyticus  ,  sensitive to both . Patient feeling better.     I discussed the assessment and treatment plan with the patient. The patient was provided an opportunity to ask questions and all were answered. The patient agreed with the plan and demonstrated an understanding of the instructions.   The patient was advised to call back or seek an in-person evaluation if the symptoms worsen or if the condition fails to improve as anticipated.   I provided  20 minutes of non-face-to-face time during this encounter reviewing patient's current problems and past procedures/imaging studies, providing counseling on the above mentioned problems , and coordination  of  care . Sherlene Shams, MD

## 2020-01-18 NOTE — Progress Notes (Signed)
Around 7pm last night pt stated that she started having lower abdominal pain, urinary frequency, and burning.

## 2020-01-18 NOTE — Patient Instructions (Signed)
I am treating  you for a  Urinary tract infection  With Septra DS.  please take it twice daily for 3 days  You can use the AZO for the burning,  It will make your urine orange colored.   Please take a probiotic (the generic version of  Align, Flora que or Culturelle) for 3 weeks to prevent a serious antibiotic associated diarrhea  Called" clostridium difficile colitis" ( should also help prevent   vaginal yeast infection)

## 2020-01-18 NOTE — Addendum Note (Signed)
Addended by: Warden Fillers on: 01/18/2020 04:57 PM   Modules accepted: Orders

## 2020-01-19 LAB — URINALYSIS, ROUTINE W REFLEX MICROSCOPIC
Bacteria, UA: NONE SEEN /HPF
Bilirubin Urine: NEGATIVE
Glucose, UA: NEGATIVE
Hyaline Cast: NONE SEEN /LPF
Ketones, ur: NEGATIVE
Nitrite: NEGATIVE
Protein, ur: NEGATIVE
RBC / HPF: NONE SEEN /HPF (ref 0–2)
Specific Gravity, Urine: 1.006 (ref 1.001–1.03)
Squamous Epithelial / HPF: NONE SEEN /HPF (ref <=5)
pH: 6.5 (ref 5.0–8.0)

## 2020-01-20 LAB — URINE CULTURE
MICRO NUMBER:: 10452695
SPECIMEN QUALITY:: ADEQUATE

## 2020-01-21 DIAGNOSIS — N3090 Cystitis, unspecified without hematuria: Secondary | ICD-10-CM | POA: Insufficient documentation

## 2020-01-21 NOTE — Assessment & Plan Note (Addendum)
Empiric treatment started with cipro/septra .  Urine culture grew Staph saprophyticus  ,  sensitive to both . Patient feeling better.

## 2020-01-22 DIAGNOSIS — R030 Elevated blood-pressure reading, without diagnosis of hypertension: Secondary | ICD-10-CM | POA: Insufficient documentation

## 2020-01-22 DIAGNOSIS — I10 Essential (primary) hypertension: Secondary | ICD-10-CM

## 2020-01-22 DIAGNOSIS — R200 Anesthesia of skin: Secondary | ICD-10-CM | POA: Insufficient documentation

## 2020-01-22 HISTORY — DX: Essential (primary) hypertension: I10

## 2020-01-25 ENCOUNTER — Telehealth: Payer: No Typology Code available for payment source | Admitting: Internal Medicine

## 2020-02-08 DIAGNOSIS — M79601 Pain in right arm: Secondary | ICD-10-CM | POA: Insufficient documentation

## 2020-02-13 ENCOUNTER — Other Ambulatory Visit: Payer: Self-pay | Admitting: Neurological Surgery

## 2020-02-13 DIAGNOSIS — M542 Cervicalgia: Secondary | ICD-10-CM

## 2020-02-13 DIAGNOSIS — M48062 Spinal stenosis, lumbar region with neurogenic claudication: Secondary | ICD-10-CM

## 2020-02-26 ENCOUNTER — Ambulatory Visit
Admission: RE | Admit: 2020-02-26 | Discharge: 2020-02-26 | Disposition: A | Discharge status: Home / Self Care | Payer: No Typology Code available for payment source | Source: Ambulatory Visit | Attending: Neurological Surgery | Admitting: Neurological Surgery

## 2020-02-26 ENCOUNTER — Other Ambulatory Visit: Payer: Self-pay

## 2020-02-26 DIAGNOSIS — M542 Cervicalgia: Secondary | ICD-10-CM | POA: Insufficient documentation

## 2020-02-26 DIAGNOSIS — M48062 Spinal stenosis, lumbar region with neurogenic claudication: Secondary | ICD-10-CM

## 2020-02-28 DIAGNOSIS — S129XXA Fracture of neck, unspecified, initial encounter: Secondary | ICD-10-CM | POA: Insufficient documentation

## 2020-03-31 ENCOUNTER — Other Ambulatory Visit: Payer: Self-pay | Admitting: Internal Medicine

## 2020-03-31 DIAGNOSIS — M792 Neuralgia and neuritis, unspecified: Secondary | ICD-10-CM

## 2020-03-31 DIAGNOSIS — B029 Zoster without complications: Secondary | ICD-10-CM

## 2020-04-14 ENCOUNTER — Other Ambulatory Visit: Payer: Self-pay

## 2020-04-14 ENCOUNTER — Ambulatory Visit (INDEPENDENT_AMBULATORY_CARE_PROVIDER_SITE_OTHER): Payer: No Typology Code available for payment source | Admitting: Internal Medicine

## 2020-04-14 ENCOUNTER — Encounter: Payer: Self-pay | Admitting: Internal Medicine

## 2020-04-14 VITALS — BP 140/82 | HR 69 | Temp 99.0°F | Resp 14 | Ht 66.0 in | Wt 153.2 lb

## 2020-04-14 DIAGNOSIS — E034 Atrophy of thyroid (acquired): Secondary | ICD-10-CM

## 2020-04-14 DIAGNOSIS — E559 Vitamin D deficiency, unspecified: Secondary | ICD-10-CM

## 2020-04-14 DIAGNOSIS — Z13220 Encounter for screening for lipoid disorders: Secondary | ICD-10-CM

## 2020-04-14 DIAGNOSIS — Z0001 Encounter for general adult medical examination with abnormal findings: Secondary | ICD-10-CM

## 2020-04-14 DIAGNOSIS — R5383 Other fatigue: Secondary | ICD-10-CM

## 2020-04-14 DIAGNOSIS — M4722 Other spondylosis with radiculopathy, cervical region: Secondary | ICD-10-CM

## 2020-04-14 NOTE — Progress Notes (Signed)
Patient ID: Theresa [name], female    DOB: 03-24-1956  Age: 64 y.o. MRN: [id number]  The patient is here for annual preventive examination and management of other chronic and acute problems.   The risk factors are reflected in the social history.  The roster of all physicians providing medical care to patient - is listed in the Snapshot section of the chart.  Activities of daily living:  The patient is 100% independent in all ADLs: dressing, toileting, feeding as well as independent mobility  Home safety : The patient has smoke detectors in the home. They wear seatbelts.  There are no firearms at home. There is no violence in the home.   There is no risks for hepatitis, STDs or HIV. There is no   history of blood transfusion. They have no travel history to infectious disease endemic areas of the world.  The patient has seen their dentist in the last six month. They have seen their eye doctor in the last year. They admit to slight hearing difficulty with regard to whispered voices and some television programs.  They have deferred audiologic testing in the last year.  They do not  have excessive sun exposure. Discussed the need for sun protection: hats, long sleeves and use of sunscreen if there is significant sun exposure.   Diet: the importance of a healthy diet is discussed. They do have a healthy diet.  The benefits of regular aerobic exercise were discussed. She walks 4 times per week ,  20 minutes.   Depression screen: there are no signs or vegative symptoms of depression- irritability, change in appetite, anhedonia, sadness/tearfullness.  Cognitive assessment: the patient manages all their financial and personal affairs and is actively engaged. They could relate day,date,year and events; recalled 2/3 objects at 3 minutes; performed clock-face test normally.  The following portions of the patient's history were reviewed and updated as appropriate: allergies, current medications, past family  history, past medical history,  past surgical history, past social history  and problem list.  Visual acuity was not assessed per patient preference since she has regular follow up with her ophthalmologist. Hearing and body mass index were assessed and reviewed.   During the course of the visit the patient was educated and counseled about appropriate screening and preventive services including : fall prevention , diabetes screening, nutrition counseling, colorectal cancer screening, and recommended immunizations.    CC: The primary encounter diagnosis was Hypothyroidism due to acquired atrophy of thyroid. Diagnoses of Fatigue, unspecified type, Screening for hyperlipidemia, Vitamin D deficiency, Osteoarthritis of spine with radiculopathy, cervical region, and Encounter for general adult medical examination with abnormal findings were also pertinent to this visit.  constant pain.  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  Holding pattern , no more surgery   Osteopenia worse at the hip -2.2 by  2020 DEXA  3rd gelsym injection right knee   [name] has a past medical history of Breast screening, unspecified, H/O: Bell's palsy, Headache, Heart murmur, History of kidney stones, Hypercalcemia, Osteoporosis, Screening for malignant neoplasm of the cervix, Special screening for malignant neoplasms, colon, Unspecified hypothyroidism, and Unspecified otitis media.   She has a past surgical history that includes Meniscus repair (Right, 01/2010); Cholecystectomy (1989); dental implant; Anterior fusion cervical spine (2013); Spine surgery; Anterior cervical decomp/discectomy fusion (13,15); and Total abdominal hysterectomy.   Her family history includes Alzheimer's disease in  her mother; Breast cancer (age of onset: 60) in her sister; Cancer (age of onset: 6) in her paternal aunt and sister;  Hyperlipidemia in her father; Thyroid nodules in her maternal grandmother, mother, and sister.She reports that she has never smoked. She has never used smokeless tobacco. She reports current alcohol use of about 5.0 standard drinks of alcohol per week. She reports that she does not use drugs.  Outpatient Medications Prior to Visit  Medication Sig Dispense Refill  . ALPRAZolam (XANAX) 0.25 MG tablet Take 1 tablet (0.25 mg total) by mouth at bedtime as needed. for anxiety 30 tablet 3  . aspirin 500 MG tablet Take 500 mg by mouth daily.     . Biotin 25427 MCG TABS Take 10,000 mcg by mouth daily.     . Cetirizine HCl (ZYRTEC PO) Take 1 tablet by mouth daily.    . Cholecalciferol (VITAMIN D3) 1000 units CAPS Take 1,000 Units by mouth daily.     . Fluticasone Propionate (FLONASE NA) Place 1 spray into the nose daily.    [name] gabapentin (NEURONTIN) 100 MG capsule TAKE 1 CAPSULE BY MOUTH THREE TIMES DAILY 90 capsule 0  . levothyroxine (SYNTHROID) 88 MCG tablet Take 1 tablet by mouth once daily 90 tablet 0  . Melatonin 10 MG TABS Take 2 tablets by mouth at bedtime.     . ciprofloxacin (CIPRO) 250 MG tablet Take 1 tablet (250 mg total) by mouth 2 (two) times daily. 6 tablet 0  . sulfamethoxazole-trimethoprim (BACTRIM DS) 800-160 MG tablet Take 1 tablet by mouth 2 (two) times daily. 6 tablet 0   No facility-administered medications prior to visit.    Review of Systems   Patient denies headache, fevers, malaise, unintentional weight loss, skin rash, eye pain, sinus congestion and sinus pain, sore throat, dysphagia,  hemoptysis , cough, dyspnea, wheezing, chest pain, palpitations, orthopnea, edema, abdominal pain, nausea, melena, diarrhea, constipation, flank pain, dysuria, hematuria, urinary  Frequency, nocturia, numbness, tingling, seizures,  Focal weakness, Loss of consciousness,  Tremor, insomnia, depression, anxiety, and suicidal ideation.      Objective:  BP 140/82 (BP Location: Left Arm, Patient  Position: Sitting, Cuff Size: Normal)   Pulse 69   Temp 99 F (37.2 C) (Oral)   Resp 14   Ht 5\' 6"  (1.676 m)   Wt 153 lb 3.2 oz (69.5 kg)   SpO2 98%   BMI 24.73 kg/m   Physical Exam   General appearance: alert, cooperative and appears stated age Head: Normocephalic, without obvious abnormality, atraumatic Eyes: conjunctivae/corneas clear. PERRL, EOM's intact. Fundi benign. Ears: normal TM's and external ear canals both ears Nose: Nares normal. Septum midline. Mucosa normal. No drainage or sinus tenderness. Throat: lips, mucosa, and tongue normal; teeth and gums normal Neck: no adenopathy, no carotid bruit, no JVD, supple, symmetrical, trachea midline and thyroid not enlarged, symmetric, no tenderness/mass/nodules Lungs: clear to auscultation bilaterally Breasts: normal appearance, no masses or tenderness Heart: regular rate and rhythm, S1, S2 normal, no murmur, click, rub or gallop Abdomen: soft, non-tender; bowel sounds normal; no masses,  no organomegaly Extremities: extremities normal, atraumatic, no cyanosis or edema Pulses: 2+ and symmetric Skin: Skin color, texture, turgor normal. No rashes or lesions Neurologic: Alert and oriented X 3, normal strength and tone. Normal symmetric reflexes. Normal coordination and gait.      Assessment & Plan:   Problem List Items Addressed This Visit      Unprioritized   Cervical spine degeneration    With persistnet nocturnal  neck pain and occipital headache.  tylenol and gabapentin trial for pain management.       Encounter for general adult medical examination with abnormal findings    age appropriate education and counseling updated, referrals for preventative services and immunizations addressed, dietary and smoking counseling addressed, most recent labs reviewed.  I have personally reviewed and have noted:  1) the patient's medical and social history 2) The pt's use of alcohol, tobacco, and illicit drugs 3) The patient's  current medications and supplements 4) Functional ability including ADL's, fall risk, home safety risk, hearing and visual impairment 5) Diet and physical activities 6) Evidence for depression or mood disorder 7) The patient's height, weight, and BMI have been recorded in the chart  I have made referrals, and provided counseling and education based on review of the above      Hypothyroidism - Primary    Thyroid function is normal/   Lab Results  Component Value Date   TSH 2.41 12/27/2019         Relevant Orders   TSH    Other Visit Diagnoses    Fatigue, unspecified type       Relevant Orders   Comprehensive metabolic panel   CBC with Differential/Platelet   Screening for hyperlipidemia       Relevant Orders   Lipid panel   Vitamin D deficiency       Relevant Orders   VITAMIN D 25 Hydroxy (Vit-D Deficiency, Fractures)      I have discontinued Pixie A. Converse "Becky"'s ciprofloxacin and sulfamethoxazole-trimethoprim. I am also having her maintain her Biotin, Vitamin D3, aspirin, Fluticasone Propionate (FLONASE NA), Cetirizine HCl (ZYRTEC PO), Melatonin, levothyroxine, ALPRAZolam, and gabapentin.  No orders of the defined types were placed in this encounter.   Medications Discontinued During This Encounter  Medication Reason  . ciprofloxacin (CIPRO) 250 MG tablet Completed Course  . sulfamethoxazole-trimethoprim (BACTRIM DS) 800-160 MG tablet Completed Course    Follow-up: No follow-ups on file.   Sherlene Shams, MD

## 2020-04-14 NOTE — Patient Instructions (Addendum)
For your neck/back pain:   You can add up to 2000 mg of acetominophen (tylenol) every day safely  In divided doses (500 mg every 6 hours  Or 1000 mg every 12 hours.)  You can increase the gabapentin  if tolerated or needed by 100 mg daily    Health Maintenance for Postmenopausal Women Menopause is a normal process in which your ability to get pregnant comes to an end. This process happens slowly over many months or years, usually between the ages of 29 and 35. Menopause is complete when you have missed your menstrual periods for 12 months. It is important to talk with your health care provider about some of the most common conditions that affect women after menopause (postmenopausal women). These include heart disease, cancer, and bone loss (osteoporosis). Adopting a healthy lifestyle and getting preventive care can help to promote your health and wellness. The actions you take can also lower your chances of developing some of these common conditions. What should I know about menopause? During menopause, you may get a number of symptoms, such as:  Hot flashes. These can be moderate or severe.  Night sweats.  Decrease in sex drive.  Mood swings.  Headaches.  Tiredness.  Irritability.  Memory problems.  Insomnia. Choosing to treat or not to treat these symptoms is a decision that you make with your health care provider. Do I need hormone replacement therapy?  Hormone replacement therapy is effective in treating symptoms that are caused by menopause, such as hot flashes and night sweats.  Hormone replacement carries certain risks, especially as you become older. If you are thinking about using estrogen or estrogen with progestin, discuss the benefits and risks with your health care provider. What is my risk for heart disease and stroke? The risk of heart disease, heart attack, and stroke increases as you age. One of the causes may be a change in the body's hormones during  menopause. This can affect how your body uses dietary fats, triglycerides, and cholesterol. Heart attack and stroke are medical emergencies. There are many things that you can do to help prevent heart disease and stroke. Watch your blood pressure  High blood pressure causes heart disease and increases the risk of stroke. This is more likely to develop in people who have high blood pressure readings, are of African descent, or are overweight.  Have your blood pressure checked: ? Every 3-5 years if you are 56-49 years of age. ? Every year if you are 60 years old or older. Eat a healthy diet   Eat a diet that includes plenty of vegetables, fruits, low-fat dairy products, and lean protein.  Do not eat a lot of foods that are high in solid fats, added sugars, or sodium. Get regular exercise Get regular exercise. This is one of the most important things you can do for your health. Most adults should:  Try to exercise for at least 150 minutes each week. The exercise should increase your heart rate and make you sweat (moderate-intensity exercise).  Try to do strengthening exercises at least twice each week. Do these in addition to the moderate-intensity exercise.  Spend less time sitting. Even light physical activity can be beneficial. Other tips  Work with your health care provider to achieve or maintain a healthy weight.  Do not use any products that contain nicotine or tobacco, such as cigarettes, e-cigarettes, and chewing tobacco. If you need help quitting, ask your health care provider.  Know your numbers. Ask your  health care provider to check your cholesterol and your blood sugar (glucose). Continue to have your blood tested as directed by your health care provider. Do I need screening for cancer? Depending on your health history and family history, you may need to have cancer screening at different stages of your life. This may include screening for:  Breast cancer.  Cervical  cancer.  Lung cancer.  Colorectal cancer. What is my risk for osteoporosis? After menopause, you may be at increased risk for osteoporosis. Osteoporosis is a condition in which bone destruction happens more quickly than new bone creation. To help prevent osteoporosis or the bone fractures that can happen because of osteoporosis, you may take the following actions:  If you are 38-82 years old, get at least 1,000 mg of calcium and at least 600 mg of vitamin D per day.  If you are older than age 40 but younger than age 42, get at least 1,200 mg of calcium and at least 600 mg of vitamin D per day.  If you are older than age 56, get at least 1,200 mg of calcium and at least 800 mg of vitamin D per day. Smoking and drinking excessive alcohol increase the risk of osteoporosis. Eat foods that are rich in calcium and vitamin D, and do weight-bearing exercises several times each week as directed by your health care provider. How does menopause affect my mental health? Depression may occur at any age, but it is more common as you become older. Common symptoms of depression include:  Low or sad mood.  Changes in sleep patterns.  Changes in appetite or eating patterns.  Feeling an overall lack of motivation or enjoyment of activities that you previously enjoyed.  Frequent crying spells. Talk with your health care provider if you think that you are experiencing depression. General instructions See your health care provider for regular wellness exams and vaccines. This may include:  Scheduling regular health, dental, and eye exams.  Getting and maintaining your vaccines. These include: ? Influenza vaccine. Get this vaccine each year before the flu season begins. ? Pneumonia vaccine. ? Shingles vaccine. ? Tetanus, diphtheria, and pertussis (Tdap) booster vaccine. Your health care provider may also recommend other immunizations. Tell your health care provider if you have ever been abused or do  not feel safe at home. Summary  Menopause is a normal process in which your ability to get pregnant comes to an end.  This condition causes hot flashes, night sweats, decreased interest in sex, mood swings, headaches, or lack of sleep.  Treatment for this condition may include hormone replacement therapy.  Take actions to keep yourself healthy, including exercising regularly, eating a healthy diet, watching your weight, and checking your blood pressure and blood sugar levels.  Get screened for cancer and depression. Make sure that you are up to date with all your vaccines. This information is not intended to replace advice given to you by your health care provider. Make sure you discuss any questions you have with your health care provider. Document Revised: 08/23/2018 Document Reviewed: 08/23/2018 Elsevier Patient Education  2020 [employer].

## 2020-04-15 NOTE — Assessment & Plan Note (Signed)
Thyroid function is normal/   Lab Results  Component Value Date   TSH 2.41 12/27/2019

## 2020-04-15 NOTE — Assessment & Plan Note (Signed)
With persistnet nocturnal neck pain and occipital headache.  tylenol and gabapentin trial for pain management.

## 2020-04-15 NOTE — Assessment & Plan Note (Signed)

## 2020-04-17 ENCOUNTER — Other Ambulatory Visit: Payer: Self-pay

## 2020-04-17 ENCOUNTER — Other Ambulatory Visit (INDEPENDENT_AMBULATORY_CARE_PROVIDER_SITE_OTHER): Payer: No Typology Code available for payment source

## 2020-04-17 DIAGNOSIS — Z13220 Encounter for screening for lipoid disorders: Secondary | ICD-10-CM

## 2020-04-17 DIAGNOSIS — E559 Vitamin D deficiency, unspecified: Secondary | ICD-10-CM

## 2020-04-17 DIAGNOSIS — R5383 Other fatigue: Secondary | ICD-10-CM | POA: Diagnosis not present

## 2020-04-17 DIAGNOSIS — E034 Atrophy of thyroid (acquired): Secondary | ICD-10-CM

## 2020-04-17 LAB — CBC WITH DIFFERENTIAL/PLATELET
Absolute Monocytes: 524 cells/uL (ref 200–950)
Basophils Absolute: 78 cells/uL (ref 0–200)
Basophils Relative: 1.6 %
Eosinophils Absolute: 201 cells/uL (ref 15–500)
Eosinophils Relative: 4.1 %
HCT: 42.4 % (ref 35.0–45.0)
Hemoglobin: 14.1 g/dL (ref 11.7–15.5)
Lymphs Abs: 1274 cells/uL (ref 850–3900)
MCH: 30.8 pg (ref 27.0–33.0)
MCHC: 33.3 g/dL (ref 32.0–36.0)
MCV: 92.6 fL (ref 80.0–100.0)
MPV: 10.6 fL (ref 7.5–12.5)
Monocytes Relative: 10.7 %
Neutro Abs: 2822 cells/uL (ref 1500–7800)
Neutrophils Relative %: 57.6 %
Platelets: 260 10*3/uL (ref 140–400)
RBC: 4.58 10*6/uL (ref 3.80–5.10)
RDW: 13.2 % (ref 11.0–15.0)
Total Lymphocyte: 26 %
WBC: 4.9 10*3/uL (ref 3.8–10.8)

## 2020-04-17 LAB — COMPREHENSIVE METABOLIC PANEL
AG Ratio: 2 (calc) (ref 1.0–2.5)
ALT: 20 U/L (ref 6–29)
AST: 22 U/L (ref 10–35)
Albumin: 4.3 g/dL (ref 3.6–5.1)
Alkaline phosphatase (APISO): 74 U/L (ref 37–153)
BUN: 11 mg/dL (ref 7–25)
CO2: 31 mmol/L (ref 20–32)
Calcium: 9.6 mg/dL (ref 8.6–10.4)
Chloride: 103 mmol/L (ref 98–110)
Creat: 0.83 mg/dL (ref 0.50–0.99)
Globulin: 2.2 g/dL (calc) (ref 1.9–3.7)
Glucose, Bld: 99 mg/dL (ref 65–99)
Potassium: 4.7 mmol/L (ref 3.5–5.3)
Sodium: 141 mmol/L (ref 135–146)
Total Bilirubin: 0.7 mg/dL (ref 0.2–1.2)
Total Protein: 6.5 g/dL (ref 6.1–8.1)

## 2020-04-17 LAB — LIPID PANEL
Cholesterol: 232 mg/dL — ABNORMAL HIGH (ref <200)
HDL: 84 mg/dL (ref >=50)
LDL Cholesterol (Calc): 127 mg/dL (calc) — ABNORMAL HIGH
Non-HDL Cholesterol (Calc): 148 mg/dL (calc) — ABNORMAL HIGH (ref <130)
Total CHOL/HDL Ratio: 2.8 (calc) (ref <5.0)
Triglycerides: 106 mg/dL (ref <150)

## 2020-04-17 LAB — VITAMIN D 25 HYDROXY (VIT D DEFICIENCY, FRACTURES): Vit D, 25-Hydroxy: 42 ng/mL (ref 30–100)

## 2020-04-17 LAB — TSH: TSH: 4.5 mIU/L (ref 0.40–4.50)

## 2020-04-21 ENCOUNTER — Other Ambulatory Visit: Payer: Self-pay

## 2020-04-21 DIAGNOSIS — M792 Neuralgia and neuritis, unspecified: Secondary | ICD-10-CM

## 2020-04-21 DIAGNOSIS — B029 Zoster without complications: Secondary | ICD-10-CM

## 2020-04-21 MED ORDER — GABAPENTIN 100 MG PO CAPS: 100.0000 mg | ORAL_CAPSULE | Freq: Three times a day (TID) | ORAL | 0 refills | Status: DC

## 2020-04-21 MED ORDER — LEVOTHYROXINE SODIUM 88 MCG PO TABS: ORAL_TABLET | ORAL | 0 refills | Status: DC

## 2020-04-24 ENCOUNTER — Other Ambulatory Visit: Payer: Self-pay

## 2020-04-24 NOTE — Telephone Encounter (Signed)
Refilled: 10/25/2019 Last OV: 04/14/2020 Next OV: not scheduled

## 2020-04-25 MED ORDER — ALPRAZOLAM 0.25 MG PO TABS: 0.2500 mg | ORAL_TABLET | Freq: Every evening | ORAL | 3 refills | Status: DC | PRN

## 2020-05-01 ENCOUNTER — Other Ambulatory Visit: Payer: Self-pay | Admitting: Internal Medicine

## 2020-05-01 DIAGNOSIS — M792 Neuralgia and neuritis, unspecified: Secondary | ICD-10-CM

## 2020-05-01 DIAGNOSIS — B029 Zoster without complications: Secondary | ICD-10-CM

## 2020-05-01 MED ORDER — GABAPENTIN 100 MG PO CAPS: 200.0000 mg | ORAL_CAPSULE | Freq: Three times a day (TID) | ORAL | 5 refills | Status: DC

## 2020-05-08 ENCOUNTER — Ambulatory Visit (HOSPITAL_COMMUNITY): Payer: No Typology Code available for payment source | Attending: Cardiology

## 2020-05-08 ENCOUNTER — Other Ambulatory Visit: Payer: Self-pay

## 2020-05-08 DIAGNOSIS — I359 Nonrheumatic aortic valve disorder, unspecified: Secondary | ICD-10-CM

## 2020-05-08 LAB — ECHOCARDIOGRAM COMPLETE
AR max vel: 2.65 cm2
AV Area VTI: 2.94 cm2
AV Area mean vel: 3.07 cm2
AV Mean grad: 6 mmHg
AV Peak grad: 13 mmHg
Ao pk vel: 1.81 m/s
Area-P 1/2: 2.7 cm2
P 1/2 time: 481 msec
S' Lateral: 3.6 cm

## 2020-06-06 DIAGNOSIS — Z1231 Encounter for screening mammogram for malignant neoplasm of breast: Secondary | ICD-10-CM

## 2020-06-06 DIAGNOSIS — E034 Atrophy of thyroid (acquired): Secondary | ICD-10-CM

## 2020-06-07 NOTE — Addendum Note (Signed)
Addended by: Sherlene Shams on: 06/07/2020 03:05 PM   Modules accepted: Orders

## 2020-07-21 ENCOUNTER — Telehealth: Payer: Self-pay

## 2020-07-21 MED ORDER — LEVOTHYROXINE SODIUM 88 MCG PO TABS: ORAL_TABLET | ORAL | 1 refills | Status: DC

## 2020-07-21 NOTE — Telephone Encounter (Signed)
Pt needs 90 day refill of levothyroxine (SYNTHROID) 88 MCG tablet

## 2020-09-03 ENCOUNTER — Ambulatory Visit
Admission: RE | Admit: 2020-09-03 | Discharge: 2020-09-03 | Disposition: A | Discharge status: Home / Self Care | Payer: No Typology Code available for payment source | Source: Ambulatory Visit | Attending: Internal Medicine | Admitting: Internal Medicine

## 2020-09-03 ENCOUNTER — Other Ambulatory Visit: Payer: Self-pay

## 2020-09-03 DIAGNOSIS — Z1231 Encounter for screening mammogram for malignant neoplasm of breast: Secondary | ICD-10-CM | POA: Diagnosis not present

## 2020-10-22 ENCOUNTER — Other Ambulatory Visit: Payer: No Typology Code available for payment source

## 2020-10-22 ENCOUNTER — Other Ambulatory Visit: Payer: Self-pay

## 2020-10-22 DIAGNOSIS — E034 Atrophy of thyroid (acquired): Secondary | ICD-10-CM | POA: Diagnosis not present

## 2020-10-22 NOTE — Addendum Note (Signed)
Addended by: Warden Fillers on: 10/22/2020 02:09 PM   Modules accepted: Orders

## 2020-10-27 ENCOUNTER — Other Ambulatory Visit: Payer: Self-pay | Admitting: *Deleted

## 2020-10-27 DIAGNOSIS — E034 Atrophy of thyroid (acquired): Secondary | ICD-10-CM

## 2020-10-27 NOTE — Addendum Note (Signed)
Addended by: Warden Fillers on: 10/27/2020 05:02 PM   Modules accepted: Orders

## 2020-10-28 ENCOUNTER — Other Ambulatory Visit: Payer: Self-pay

## 2020-10-28 ENCOUNTER — Other Ambulatory Visit (INDEPENDENT_AMBULATORY_CARE_PROVIDER_SITE_OTHER): Payer: No Typology Code available for payment source

## 2020-10-28 DIAGNOSIS — E034 Atrophy of thyroid (acquired): Secondary | ICD-10-CM

## 2020-10-29 LAB — TSH: TSH: 1.66 mIU/L (ref 0.40–4.50)

## 2020-10-29 MED ORDER — LEVOTHYROXINE SODIUM 88 MCG PO TABS: ORAL_TABLET | ORAL | 1 refills | Status: DC

## 2020-10-29 NOTE — Addendum Note (Signed)
Addended by: Sherlene Shams on: 10/29/2020 10:31 PM   Modules accepted: Orders

## 2020-12-01 ENCOUNTER — Other Ambulatory Visit: Payer: Self-pay | Admitting: Internal Medicine

## 2020-12-01 DIAGNOSIS — M792 Neuralgia and neuritis, unspecified: Secondary | ICD-10-CM

## 2020-12-01 DIAGNOSIS — B029 Zoster without complications: Secondary | ICD-10-CM

## 2021-01-12 ENCOUNTER — Other Ambulatory Visit: Payer: Self-pay | Admitting: Internal Medicine

## 2021-01-12 DIAGNOSIS — B029 Zoster without complications: Secondary | ICD-10-CM

## 2021-01-12 DIAGNOSIS — M792 Neuralgia and neuritis, unspecified: Secondary | ICD-10-CM

## 2021-03-09 ENCOUNTER — Other Ambulatory Visit: Payer: Self-pay | Admitting: Internal Medicine

## 2021-03-09 DIAGNOSIS — B029 Zoster without complications: Secondary | ICD-10-CM

## 2021-03-09 DIAGNOSIS — M792 Neuralgia and neuritis, unspecified: Secondary | ICD-10-CM

## 2021-03-11 ENCOUNTER — Telehealth: Payer: Self-pay | Admitting: Internal Medicine

## 2021-03-11 DIAGNOSIS — I359 Nonrheumatic aortic valve disorder, unspecified: Secondary | ICD-10-CM

## 2021-03-11 NOTE — Telephone Encounter (Signed)
Forwarding to Dr. Tenny Craw for review/advisement.  Pt last seen telehealth 12/2018. Last echo 04/2020

## 2021-03-11 NOTE — Telephone Encounter (Signed)
New Message:    Pt says she usually gets an Echo every year. I did not see an order for one, does she need one?

## 2021-03-11 NOTE — Telephone Encounter (Signed)
Yes, pt should have an echo this fall/winter to reevaluate Aortic insufficiency

## 2021-03-12 NOTE — Telephone Encounter (Signed)
Echo orders placed. Pt messaged to call and schedule for Sept or October this year.

## 2021-04-15 ENCOUNTER — Other Ambulatory Visit: Payer: Self-pay | Admitting: Internal Medicine

## 2021-04-15 ENCOUNTER — Ambulatory Visit: Payer: No Typology Code available for payment source | Admitting: Internal Medicine

## 2021-04-15 DIAGNOSIS — M792 Neuralgia and neuritis, unspecified: Secondary | ICD-10-CM

## 2021-04-15 DIAGNOSIS — B029 Zoster without complications: Secondary | ICD-10-CM

## 2021-04-15 NOTE — Telephone Encounter (Signed)
Has upcoming appointment for 05/21/21 last appt 8/21 okay to fill?

## 2021-05-14 ENCOUNTER — Ambulatory Visit (HOSPITAL_COMMUNITY): Payer: Medicare Other | Attending: Cardiology

## 2021-05-14 ENCOUNTER — Other Ambulatory Visit: Payer: Self-pay

## 2021-05-14 DIAGNOSIS — I359 Nonrheumatic aortic valve disorder, unspecified: Secondary | ICD-10-CM | POA: Insufficient documentation

## 2021-05-14 LAB — ECHOCARDIOGRAM COMPLETE
Area-P 1/2: 3.66 cm2
P 1/2 time: 487 msec
S' Lateral: 2.7 cm

## 2021-05-21 ENCOUNTER — Encounter: Payer: Self-pay | Admitting: Internal Medicine

## 2021-05-21 ENCOUNTER — Telehealth: Payer: Self-pay | Admitting: Internal Medicine

## 2021-05-21 ENCOUNTER — Other Ambulatory Visit: Payer: Self-pay

## 2021-05-21 ENCOUNTER — Ambulatory Visit (INDEPENDENT_AMBULATORY_CARE_PROVIDER_SITE_OTHER): Payer: Medicare Other | Admitting: Internal Medicine

## 2021-05-21 ENCOUNTER — Ambulatory Visit (INDEPENDENT_AMBULATORY_CARE_PROVIDER_SITE_OTHER): Payer: Medicare Other

## 2021-05-21 VITALS — BP 138/66 | HR 58 | Temp 96.0°F | Ht 66.0 in | Wt 147.2 lb

## 2021-05-21 DIAGNOSIS — G8929 Other chronic pain: Secondary | ICD-10-CM

## 2021-05-21 DIAGNOSIS — M8589 Other specified disorders of bone density and structure, multiple sites: Secondary | ICD-10-CM | POA: Diagnosis not present

## 2021-05-21 DIAGNOSIS — Z1231 Encounter for screening mammogram for malignant neoplasm of breast: Secondary | ICD-10-CM

## 2021-05-21 DIAGNOSIS — B029 Zoster without complications: Secondary | ICD-10-CM

## 2021-05-21 DIAGNOSIS — M48062 Spinal stenosis, lumbar region with neurogenic claudication: Secondary | ICD-10-CM

## 2021-05-21 DIAGNOSIS — E559 Vitamin D deficiency, unspecified: Secondary | ICD-10-CM

## 2021-05-21 DIAGNOSIS — M4722 Other spondylosis with radiculopathy, cervical region: Secondary | ICD-10-CM

## 2021-05-21 DIAGNOSIS — M25552 Pain in left hip: Secondary | ICD-10-CM

## 2021-05-21 DIAGNOSIS — E034 Atrophy of thyroid (acquired): Secondary | ICD-10-CM | POA: Diagnosis not present

## 2021-05-21 DIAGNOSIS — L509 Urticaria, unspecified: Secondary | ICD-10-CM | POA: Insufficient documentation

## 2021-05-21 DIAGNOSIS — Z0001 Encounter for general adult medical examination with abnormal findings: Secondary | ICD-10-CM

## 2021-05-21 DIAGNOSIS — M792 Neuralgia and neuritis, unspecified: Secondary | ICD-10-CM

## 2021-05-21 DIAGNOSIS — I351 Nonrheumatic aortic (valve) insufficiency: Secondary | ICD-10-CM

## 2021-05-21 DIAGNOSIS — E782 Mixed hyperlipidemia: Secondary | ICD-10-CM

## 2021-05-21 DIAGNOSIS — Z23 Encounter for immunization: Secondary | ICD-10-CM

## 2021-05-21 LAB — COMPREHENSIVE METABOLIC PANEL
AG Ratio: 2 (calc) (ref 1.0–2.5)
ALT: 15 U/L (ref 6–29)
AST: 17 U/L (ref 10–35)
Albumin: 4.8 g/dL (ref 3.6–5.1)
Alkaline phosphatase (APISO): 87 U/L (ref 37–153)
BUN: 10 mg/dL (ref 7–25)
CO2: 29 mmol/L (ref 20–32)
Calcium: 10 mg/dL (ref 8.6–10.4)
Chloride: 101 mmol/L (ref 98–110)
Creat: 0.74 mg/dL (ref 0.50–1.05)
Globulin: 2.4 g/dL (calc) (ref 1.9–3.7)
Glucose, Bld: 95 mg/dL (ref 65–99)
Potassium: 4.7 mmol/L (ref 3.5–5.3)
Sodium: 140 mmol/L (ref 135–146)
Total Bilirubin: 0.7 mg/dL (ref 0.2–1.2)
Total Protein: 7.2 g/dL (ref 6.1–8.1)

## 2021-05-21 LAB — VITAMIN D 25 HYDROXY (VIT D DEFICIENCY, FRACTURES): Vit D, 25-Hydroxy: 50 ng/mL (ref 30–100)

## 2021-05-21 LAB — LIPID PANEL
Cholesterol: 243 mg/dL — ABNORMAL HIGH (ref <200)
HDL: 83 mg/dL (ref >=50)
LDL Cholesterol (Calc): 138 mg/dL (calc) — ABNORMAL HIGH
Non-HDL Cholesterol (Calc): 160 mg/dL (calc) — ABNORMAL HIGH (ref <130)
Total CHOL/HDL Ratio: 2.9 (calc) (ref <5.0)
Triglycerides: 106 mg/dL (ref <150)

## 2021-05-21 LAB — TSH: TSH: 2.57 mIU/L (ref 0.40–4.50)

## 2021-05-21 MED ORDER — TETANUS-DIPHTH-ACELL PERTUSSIS 5-2.5-18.5 LF-MCG/0.5 IM SUSY: 0.5000 mL | PREFILLED_SYRINGE | Freq: Once | INTRAMUSCULAR | 0 refills | Status: AC

## 2021-05-21 NOTE — Telephone Encounter (Signed)
Pt is calling in regards to getting her Echo Results from 05/14/21

## 2021-05-21 NOTE — Assessment & Plan Note (Signed)
Stable,  Normal LV function by NNUAL echo ORDERED BY Dietrich Pates Sept 2022

## 2021-05-21 NOTE — Telephone Encounter (Signed)
Left message for patient to call back  

## 2021-05-21 NOTE — Assessment & Plan Note (Signed)
CURRENTLY not on therapy due to esophagitis from alendronate.  Taking 1000 vit d daily  Reviewed calcium needs

## 2021-05-21 NOTE — Progress Notes (Signed)
Patient ID: Theresa [name], female    DOB: 1956/01/12  Age: 65 y.o. MRN: [id number]  The patient is here for follow up and management of other chronic and acute problems.  This visit occurred during the SARS-CoV-2 public health emergency.  Safety protocols were in place, including screening questions prior to the visit, additional usage of staff PPE, and extensive cleaning of exam room while observing appropriate contact time as indicated for disinfecting solutions.     The risk factors are reflected in the social history.  The roster of all physicians providing medical care to patient - is listed in the Snapshot section of the chart.  Activities of daily living:  The patient is 100% independent in all ADLs: dressing, toileting, feeding as well as independent mobility  Home safety : The patient has smoke detectors in the home. They wear seatbelts.  There are no firearms at home. There is no violence in the home.   There is no risks for hepatitis, STDs or HIV. There is no   history of blood transfusion. They have no travel history to infectious disease endemic areas of the world.  The patient has seen their dentist in the last six month. They have seen their eye doctor in the last year. She denies  hearing difficulty with regard to whispered voices and some television programs.  They have deferred audiologic testing in the last year.  They do not  have excessive sun exposure. Discussed the need for sun protection: hats, long sleeves and use of sunscreen if there is significant sun exposure.   Diet: the importance of a healthy diet is discussed. They do have a healthy diet.  The benefits of regular aerobic exercise were discussed. She walks 4 times per week ,  20 minutes.   Depression screen: there are no signs or vegative symptoms of depression- irritability, change in appetite, anhedonia, sadness/tearfullness.  Cognitive assessment: the patient manages all their financial and personal  affairs and is actively engaged. They could relate day,date,year and events; recalled 2/3 objects at 3 minutes; performed clock-face test normally.  The following portions of the patient's history were reviewed and updated as appropriate: allergies, current medications, past family history, past medical history,  past surgical history, past social history  and problem list.  Visual acuity was not assessed per patient preference since she has regular follow up with her ophthalmologist. Hearing and body mass index were assessed and reviewed.   During the course of the visit the patient was educated and counseled about appropriate screening and preventive services including : fall prevention , diabetes screening, nutrition counseling, colorectal cancer screening, and recommended immunizations.    CC: The primary encounter diagnosis was Hip pain, chronic, left. Diagnoses of Osteopenia of multiple sites, Breast cancer screening by mammogram, Hypothyroidism due to acquired atrophy of thyroid, Vitamin D deficiency, Moderate mixed hyperlipidemia not requiring statin therapy, COVID-19 vaccine administered, Osteoarthritis of spine with radiculopathy, cervical region, Lumbar stenosis with neurogenic claudication, Aortic valve insufficiency, etiology of cardiac valve disease unspecified, Urticaria, and Encounter for general adult medical examination with abnormal findings were also pertinent to this visit.  1) degeneraitve disk disease:  since Dr. Nila NephewNudelmann retired she has established care with  Dr Marikay Alaravid Jones for left sided hip pain secondary to sciatica .  MRI was repeated yesterday.  Neck still constantly painful despite surgery. Not using a cervical pillow with couch time.  Using a cervical pillow.  Seeing pain management ,  taking hydrocodone .  Refill history  confirmed via [place] Controlled Substance databas, accessed by me today.[name]    History Ulyssa has a past medical history of Breast screening, unspecified,  H/O: Bell's palsy, Headache, Heart murmur, History of kidney stones, Hypercalcemia, Osteoporosis, Screening for malignant neoplasm of the cervix, Special screening for malignant neoplasms, colon, Unspecified hypothyroidism, and Unspecified otitis media.   She has a past surgical history that includes Meniscus repair (Right, 01/2010); Cholecystectomy (1989); dental implant; Anterior fusion cervical spine (2013); Spine surgery; Anterior cervical decomp/discectomy fusion (13,15); and Total abdominal hysterectomy.   Her family history includes Alzheimer's disease in her mother; Breast cancer in her paternal aunt; Breast cancer (age of onset: 61) in her sister; Cancer (age of onset: 58) in her sister; Hyperlipidemia in her father; Thyroid nodules in her maternal grandmother, mother, and sister.She reports that she has never smoked. She has never used smokeless tobacco. She reports current alcohol use of about 5.0 standard drinks per week. She reports that she does not use drugs.  Outpatient Medications Prior to Visit  Medication Sig Dispense Refill   Cetirizine HCl (ZYRTEC PO) Take 1 tablet by mouth daily.     Cholecalciferol (VITAMIN D3) 1000 units CAPS Take 1,000 Units by mouth daily.      Fluticasone Propionate (FLONASE NA) Place 1 spray into the nose daily.     Multiple Vitamin (MULTIVITAMIN) tablet Take 1 tablet by mouth daily.     ALPRAZolam (XANAX) 0.25 MG tablet Take 1 tablet (0.25 mg total) by mouth at bedtime as needed. for anxiety 30 tablet 3   gabapentin (NEURONTIN) 100 MG capsule TAKE 2 CAPSULES BY MOUTH THREE TIMES DAILY 180 capsule 0   levothyroxine (SYNTHROID) 88 MCG tablet Take 1 tablet by mouth once daily 90 tablet 1   HYDROcodone-acetaminophen (NORCO/VICODIN) 5-325 MG tablet Take 1 tablet by mouth daily as needed.     Magnesium (RA NATURAL MAGNESIUM) 250 MG TABS Take 1 tablet by mouth daily.     aspirin 500 MG tablet Take 500 mg by mouth daily.      Biotin 50539 MCG TABS Take 10,000  mcg by mouth daily.      Melatonin 10 MG TABS Take 2 tablets by mouth at bedtime.      No facility-administered medications prior to visit.    Review of Systems  Patient denies headache, fevers, malaise, unintentional weight loss, skin rash, eye pain, sinus congestion and sinus pain, sore throat, dysphagia,  hemoptysis , cough, dyspnea, wheezing, chest pain, palpitations, orthopnea, edema, abdominal pain, nausea, melena, diarrhea, constipation, flank pain, dysuria, hematuria, urinary  Frequency, nocturia, numbness, tingling, seizures,  Focal weakness, Loss of consciousness,  Tremor, insomnia, depression, anxiety, and suicidal ideation.    Objective:  BP 138/66 (BP Location: Left Arm, Patient Position: Sitting, Cuff Size: Normal)   Pulse (!) 58   Temp (!) 96 F (35.6 C) (Temporal)   Ht 5\' 6"  (1.676 m)   Wt 147 lb 3.2 oz (66.8 kg)   SpO2 99%   BMI 23.76 kg/m   Physical Exam  General appearance: alert, cooperative and appears stated age Head: Normocephalic, without obvious abnormality, atraumatic Eyes: conjunctivae/corneas clear. PERRL, EOM's intact. Fundi benign. Ears: normal TM's and external ear canals both ears Nose: Nares normal. Septum midline. Mucosa normal. No drainage or sinus tenderness. Throat: lips, mucosa, and tongue normal; teeth and gums normal Neck: no adenopathy, no carotid bruit, no JVD, supple, symmetrical, trachea midline and thyroid not enlarged, symmetric, no tenderness/mass/nodules Lungs: clear to auscultation bilaterally Breasts: normal appearance, no masses  or tenderness Heart: regular rate and rhythm, S1, S2 normal, AI murmur non radiating ,  no click, rub or gallop Abdomen: soft, non-tender; bowel sounds normal; no masses,  no organomegaly Extremities: extremities normal, atraumatic, no cyanosis or edema Pulses: 2+ and symmetric Skin: Skin color, texture, turgor normal. No rashes or lesions Neurologic: Alert and oriented X 3, normal strength and tone.  Normal symmetric reflexes. Normal coordination and gait.     Assessment & Plan:   Problem List Items Addressed This Visit       Unprioritized   Breast cancer screening by mammogram    Breast exam normal  Mammogram ordered       Relevant Orders   MM 3D SCREEN BREAST BILATERAL   Hypothyroidism    Thyroid function is normal on current dose of levothyroxine, 88 mcg no changes today  Lab Results  Component Value Date   TSH 2.57 05/21/2021         Relevant Orders   TSH (Completed)   Cervical spine degeneration    She has chronic pain in spite of surgeries  Managed with narcotics prescribed by pain management. Recommended use of neck pillow (horseshoe)      Relevant Medications   HYDROcodone-acetaminophen (NORCO/VICODIN) 5-325 MG tablet   Osteopenia    CURRENTLY not on therapy due to esophagitis from alendronate.  Taking 1000 vit d daily  Reviewed calcium needs      Relevant Orders   DG Bone Density   Comprehensive metabolic panel (Completed)   Lumbar stenosis with neurogenic claudication    Now seeing Lennart Pall  For follow up on recent escalation of pain radiating to let groin/hip which started during a yoga class.  Ordering plain films of left hip today to rule out contribution from OA/DJd      Relevant Medications   HYDROcodone-acetaminophen (NORCO/VICODIN) 5-325 MG tablet   Aortic regurgitation    Stable,  Normal LV function by NNUAL echo ORDERED BY Dietrich Pates Sept 2022      Urticaria    Chronic managed with once daily zyrtec.   Increase to 12 hour dosing ,  Referral to [place] dermatology made      Relevant Orders   Ambulatory referral to Dermatology   RESOLVED: Encounter for general adult medical examination with abnormal findings    age appropriate education and counseling updated, referrals for preventative services and immunizations addressed, dietary and smoking counseling addressed, most recent labs reviewed.  I have personally reviewed and have noted:    1) the patient's medical and social history 2) The pt's use of alcohol, tobacco, and illicit drugs 3) The patient's current medications and supplements 4) Functional ability including ADL's, fall risk, home safety risk, hearing and visual impairment 5) Diet and physical activities 6) Evidence for depression or mood disorder 7) The patient's height, weight, and BMI have been recorded in the chart   I have made referrals, and provided counseling and education based on review of the above      Other Visit Diagnoses     Hip pain, chronic, left    -  Primary   Relevant Medications   HYDROcodone-acetaminophen (NORCO/VICODIN) 5-325 MG tablet   Other Relevant Orders   DG Hip Unilat W OR W/O Pelvis 2-3 Views Left (Completed)   Vitamin D deficiency       Relevant Orders   VITAMIN D 25 Hydroxy (Vit-D Deficiency, Fractures) (Completed)   Moderate mixed hyperlipidemia not requiring statin therapy       Relevant  Orders   Lipid panel (Completed)   COVID-19 vaccine administered       Relevant Orders   SARS-CoV-2 Semi-Quantitative Total Antibody, Spike      I spent 30 mintutes dedicated to the care of this patient on the date of this encounter to include pre-visit review of his medical history,  Face-to-face time with the patient , and post visit ordering of testing and therapeutics.  Meds ordered this encounter  Medications   Tdap (BOOSTRIX) 5-2.5-18.5 LF-MCG/0.5 injection    Sig: Inject 0.5 mLs into the muscle once for 1 dose.    Dispense:  0.5 mL    Refill:  0    Medications Discontinued During This Encounter  Medication Reason   aspirin 500 MG tablet    Biotin 65993 MCG TABS    Melatonin 10 MG TABS     Follow-up: No follow-ups on file.   Sherlene Shams, MD

## 2021-05-21 NOTE — Telephone Encounter (Signed)
Patient returning call to discuss echo results

## 2021-05-21 NOTE — Patient Instructions (Signed)
Your annual mammogram  and DEXA are due in December and have been ordered.  You are encouraged (required) to call to make your appointment at [facility]  [phone]   If the hip films suggest a source for your pain,  i'll refer you to the orthopeedist of your choice.   Work on [employer] 1200 mg calcium daily

## 2021-05-21 NOTE — Assessment & Plan Note (Signed)
Chronic managed with once daily zyrtec.   Increase to 12 hour dosing ,  Referral to [place] dermatology made

## 2021-05-21 NOTE — Assessment & Plan Note (Addendum)
She has chronic pain in spite of surgeries  Managed with narcotics prescribed by pain management. Recommended use of neck pillow (horseshoe)

## 2021-05-21 NOTE — Assessment & Plan Note (Signed)
Now seeing Theresa [name]  For follow up on recent escalation of pain radiating to let groin/hip which started during a yoga class.  Ordering plain films of left hip today to rule out contribution from OA/DJd

## 2021-05-21 NOTE — Telephone Encounter (Signed)
Spoke w patient. Reviewed results.  Will plan for follow up later this month in clinic w Dr. Tenny Craw.

## 2021-05-22 MED ORDER — LEVOTHYROXINE SODIUM 88 MCG PO TABS: ORAL_TABLET | ORAL | 1 refills | Status: DC

## 2021-05-22 MED ORDER — GABAPENTIN 100 MG PO CAPS: 200.0000 mg | ORAL_CAPSULE | Freq: Three times a day (TID) | ORAL | 5 refills | Status: DC

## 2021-05-22 MED ORDER — ALPRAZOLAM 0.25 MG PO TABS
0.2500 mg | ORAL_TABLET | Freq: Every evening | ORAL | 3 refills | Status: DC | PRN
Start: 2021-05-22 — End: 2022-06-17

## 2021-05-22 NOTE — Telephone Encounter (Signed)
I have refilled the gabapentin.

## 2021-05-23 ENCOUNTER — Encounter: Payer: Self-pay | Admitting: Internal Medicine

## 2021-05-23 NOTE — Assessment & Plan Note (Signed)
Breast exam normal. Mammogram ordered. 

## 2021-05-23 NOTE — Assessment & Plan Note (Signed)

## 2021-05-23 NOTE — Assessment & Plan Note (Addendum)
Thyroid function is normal on current dose of levothyroxine, 88 mcg no changes today  Lab Results  Component Value Date   TSH 2.57 05/21/2021

## 2021-05-25 LAB — SARS-COV-2 SEMI-QUANTITATIVE TOTAL ANTIBODY, SPIKE: SARS COV2 AB, Total Spike Semi QN: >2500 U/mL — ABNORMAL HIGH (ref <0.8)

## 2021-05-28 ENCOUNTER — Other Ambulatory Visit (HOSPITAL_COMMUNITY): Payer: No Typology Code available for payment source

## 2021-05-29 ENCOUNTER — Other Ambulatory Visit (HOSPITAL_COMMUNITY): Payer: Self-pay | Admitting: Neurological Surgery

## 2021-05-29 ENCOUNTER — Other Ambulatory Visit: Payer: Self-pay | Admitting: Neurological Surgery

## 2021-05-29 DIAGNOSIS — M25552 Pain in left hip: Secondary | ICD-10-CM

## 2021-06-08 ENCOUNTER — Ambulatory Visit
Admission: RE | Admit: 2021-06-08 | Discharge: 2021-06-08 | Disposition: A | Discharge status: Home / Self Care | Payer: Medicare Other | Source: Ambulatory Visit | Attending: Neurological Surgery | Admitting: Neurological Surgery

## 2021-06-08 ENCOUNTER — Other Ambulatory Visit: Payer: Self-pay

## 2021-06-08 DIAGNOSIS — M25552 Pain in left hip: Secondary | ICD-10-CM | POA: Insufficient documentation

## 2021-06-11 ENCOUNTER — Telehealth: Payer: Self-pay | Admitting: Internal Medicine

## 2021-06-11 DIAGNOSIS — M8589 Other specified disorders of bone density and structure, multiple sites: Secondary | ICD-10-CM

## 2021-06-11 DIAGNOSIS — E034 Atrophy of thyroid (acquired): Secondary | ICD-10-CM

## 2021-06-11 DIAGNOSIS — E785 Hyperlipidemia, unspecified: Secondary | ICD-10-CM

## 2021-06-11 NOTE — Telephone Encounter (Signed)
Per Patient's lab note: Please continue your current medications and plan to repeat the labs in 6 months.   Patient scheduled for labs 11/23/2021 as requested.  Needing orders placed.

## 2021-06-11 NOTE — Telephone Encounter (Signed)
Pt was advised to return for labs in 6 months. I have ordered TSH, Lipid panel and CMP. Is there anything else that needs to be ordered?

## 2021-06-11 NOTE — Addendum Note (Signed)
Addended by: Sandy Salaam on: 06/11/2021 02:31 PM   Modules accepted: Orders

## 2021-06-20 DIAGNOSIS — M87052 Idiopathic aseptic necrosis of left femur: Secondary | ICD-10-CM | POA: Insufficient documentation

## 2021-07-01 ENCOUNTER — Telehealth: Payer: Self-pay | Admitting: *Deleted

## 2021-07-01 NOTE — Telephone Encounter (Signed)
   Pre-operative Risk Assessment    Patient Name: Theresa [name]  DOB: February 11, 1956 MRN: [id number]     PT HAS APPT 07/13/21 WITH DR. Gunnar Fusi ROSS. WILL ADD PRE OP CLEARANCE NEEDED TO APPT NOTES.  Request for Surgical Clearance   Procedure:   LEFT TOTAL HIP ARTHROPLASTY   Date of Surgery: Clearance 08/31/21                                 Surgeon:  DR. Francesco Sor Surgeon's Group or Practice Name:  [facility] Phone number:  [phone] Fax number:  [phone]   Type of Clearance Requested: - Medical    Type of Anesthesia:   Not Indicated   Additional requests/questions:   Elpidio Anis   07/01/2021, 1:28 PM

## 2021-07-13 ENCOUNTER — Other Ambulatory Visit: Payer: Self-pay

## 2021-07-13 ENCOUNTER — Encounter: Payer: Self-pay | Admitting: Internal Medicine

## 2021-07-13 ENCOUNTER — Ambulatory Visit (INDEPENDENT_AMBULATORY_CARE_PROVIDER_SITE_OTHER): Payer: Medicare Other | Admitting: Internal Medicine

## 2021-07-13 VITALS — BP 132/86 | HR 63 | Ht 66.0 in | Wt 154.0 lb

## 2021-07-13 DIAGNOSIS — I351 Nonrheumatic aortic (valve) insufficiency: Secondary | ICD-10-CM

## 2021-07-13 DIAGNOSIS — I359 Nonrheumatic aortic valve disorder, unspecified: Secondary | ICD-10-CM | POA: Diagnosis not present

## 2021-07-13 MED ORDER — AMLODIPINE BESYLATE 2.5 MG PO TABS: 1.2500 mg | ORAL_TABLET | Freq: Every day | ORAL | 3 refills | Status: DC

## 2021-07-13 NOTE — Progress Notes (Signed)
Cardiology Office Note   Date:  07/13/2021   ID:  Theresa [name], DOB Mar 23, 1956, MRN [id number]  PCP:  Sherlene Shams, MD  Cardiologist:   Dietrich Pates, MD   F/U of AV dz     History of Present Illness: Theresa [name] is a 65 y.o. female with a history of quadricuspid AV   I last saw her in clinic as a televisit Since seen she has done OK   breathing is OK   No palpitations.  No CP   No dizziness Echo in Sept 2022   Moderate AI      Current Meds  Medication Sig   ALPRAZolam (XANAX) 0.25 MG tablet Take 1 tablet (0.25 mg total) by mouth at bedtime as needed. for anxiety   amLODipine (NORVASC) 2.5 MG tablet Take 0.5 tablets (1.25 mg total) by mouth daily.   Cetirizine HCl (ZYRTEC PO) Take 1 tablet by mouth daily.   Cholecalciferol (VITAMIN D3) 1000 units CAPS Take 1,000 Units by mouth daily.    Fluticasone Propionate (FLONASE NA) Place 1 spray into the nose daily.   gabapentin (NEURONTIN) 100 MG capsule Take 2 capsules (200 mg total) by mouth 3 (three) times daily.   HYDROcodone-acetaminophen (NORCO/VICODIN) 5-325 MG tablet Take 1 tablet by mouth daily as needed.   levothyroxine (SYNTHROID) 88 MCG tablet Take 1 tablet by mouth once daily   Magnesium 250 MG TABS Take 1 tablet by mouth daily.   Multiple Vitamin (MULTIVITAMIN) tablet Take 1 tablet by mouth daily.     Allergies:   Meloxicam, Topiramate, and Tramadol   Past Medical History:  Diagnosis Date   Breast screening, unspecified    H/O: Bell's palsy    Headache    Heart murmur    History of kidney stones    Hypercalcemia    Osteoporosis    Screening for malignant neoplasm of the cervix    Special screening for malignant neoplasms, colon    Unspecified hypothyroidism    thyroid nodule   Unspecified otitis media     Past Surgical History:  Procedure Laterality Date   ANTERIOR CERVICAL DECOMP/DISCECTOMY FUSION  13,15   C3 ,4 ,5 [place]   ANTERIOR FUSION CERVICAL SPINE  2013   [place]  c5-c6    CHOLECYSTECTOMY  1989   dental implant     MENISCUS REPAIR Right 01/2010   [facility]   SPINE SURGERY     [place], C3, 4 5   TOTAL ABDOMINAL HYSTERECTOMY     secondary to fibroids     Social History:  The patient  reports that she has never smoked. She has never used smokeless tobacco. She reports current alcohol use of about 5.0 standard drinks per week. She reports that she does not use drugs.   Family History:  The patient's family history includes Alzheimer's disease in her mother; Breast cancer in her paternal aunt; Breast cancer (age of onset: 87) in her sister; Cancer (age of onset: 65) in her sister; Hyperlipidemia in her father; Thyroid nodules in her maternal grandmother, mother, and sister.    ROS:  Please see the history of present illness. All other systems are reviewed and  Negative to the above problem except as noted.    PHYSICAL EXAM: VS:  BP 132/86   Pulse 63   Ht 5\' 6"  (1.676 m)   Wt 154 lb (69.9 kg)   SpO2 99%   BMI 24.86 kg/m   GEN: Well nourished, well developed, in no acute distress  HEENT: normal  Neck: no JVD, carotid bruits, or masses Cardiac: RRR; Gr I/VI diastolic murmur LSB   No LE edema  Respiratory:  clear to auscultation bilaterally,  GI: soft, nontender, nondistended, + BS  No hepatomegaly  MS: no deformity Moving all extremities   Skin: warm and dry, no rash Neuro:  Strength and sensation are intact Psych: euthymic mood, full affect   EKG:  EKG is ordered today.  NSR 63 bpm Occasional PVC  nonspecific ST changes    Echo  Sept 2022  Left ventricular ejection fraction, by estimation, is 65 to 70%. Left ventricular ejection fraction by 3D volume is 70 %. The left ventricle has normal function. The left ventricle has no regional wall motion abnormalities. Left ventricular diastolic parameters are consistent with Grade I diastolic dysfunction (impaired relaxation). 1. Right ventricular systolic function is normal. The right ventricular size is  normal. There is normal pulmonary artery systolic pressure. The estimated right ventricular systolic pressure is [id number] mmHg. 2. The mitral valve is normal in structure. Mild mitral valve regurgitation. No evidence of mitral stenosis. 3. The aortic valve is abnormal. Quadracuspid aortic valve. Although the PHT is consistent with mild AI, visually there AI appears moderate.Aortic valve regurgitation is mild to moderate. No aortic stenosis is present. Aortic regurgitation PHT measures 487 msec. 4. The inferior vena cava is normal in size with greater than 50% respiratory variability, suggesting right atrial pressure of 3 mmHg. 5. 6. Compared to study 05/08/2020 there is no change.  Lipid Panel    Component Value Date/Time   CHOL 243 (H) 05/21/2021 0947   TRIG 106 05/21/2021 0947   HDL 83 05/21/2021 0947   CHOLHDL 2.9 05/21/2021 0947   VLDL 12 01/28/2017 0900   LDLCALC 138 (H) 05/21/2021 0947   LDLDIRECT 129.4 01/02/2013 1453      Wt Readings from Last 3 Encounters:  07/13/21 154 lb (69.9 kg)  05/21/21 147 lb 3.2 oz (66.8 kg)  04/14/20 153 lb 3.2 oz (69.5 kg)      ASSESSMENT AND PLAN:  1  AV dz   Pt with quadricuspid AV   Reviewed echo with patinet   AI remains moderate   I would continue to follow with periodic echoes   2  Blood pressure  Follow closely    4  hL  Contineu to follow   LDL a little high 138   Discussed diet, limit sweets  TRE     Preop evaluation  Patient is about to have an orthopedic procedure later this winter   From a cardiac standpoint I think she is low risk and OK to proceed  Current medicines are reviewed at length with the patient today.  The patient does not have concerns regarding medicines.  Signed, Dorris Carnes, MD  07/13/2021 7:15 PM    [facility] [address], [place], [place]  [id number] Phone: [phone]; Fax: [phone]

## 2021-07-13 NOTE — Patient Instructions (Signed)
Medication Instructions:   START Amlodipine one half tablet ( 1.25 mg) daily.  *If you need a refill on your cardiac medications before your next appointment, please call your pharmacy*   Lab Work:  -NONE  If you have labs (blood work) drawn today and your tests are completely normal, you will receive your results only by: MyChart Message (if you have MyChart) OR A paper copy in the mail If you have any lab test that is abnormal or we need to change your treatment, we will call you to review the results.   Testing/Procedures:  Your physician has requested that you have an echocardiogram. Echocardiography is a painless test that uses sound waves to create images of your heart. It provides your doctor with information about the size and shape of your heart and how well your heart's chambers and valves are working. This procedure takes approximately one hour. There are no restrictions for this procedure.1 year prior to Dr. Tenny Craw Follow Up .      Follow-Up: At [facility], you and your health needs are our priority.  As part of our continuing mission to provide you with exceptional heart care, we have created designated Provider Care Teams.  These Care Teams include your primary Cardiologist (physician) and Advanced Practice Providers (APPs -  Physician Assistants and Nurse Practitioners) who all work together to provide you with the care you need, when you need it.  We recommend signing up for the patient portal called "MyChart".  Sign up information is provided on this After Visit Summary.  MyChart is used to connect with patients for Virtual Visits (Telemedicine).  Patients are able to view lab/test results, encounter notes, upcoming appointments, etc.  Non-urgent messages can be sent to your provider as well.   To learn more about what you can do with MyChart, go to [email or web address].    Your next appointment:   1 year(s)  The format for your next appointment:   In  Person  Provider:   Dietrich Pates, MD   Other Instructions  Your physician wants you to follow-up in: 1 year with Dr.Ross with Echo prior.  You will receive a reminder letter in the mail two months in advance. If you don't receive a letter, please call our office to schedule the follow-up appointment.  Please send in blood pressure readings through mychart.

## 2021-08-09 ENCOUNTER — Encounter: Payer: Self-pay | Admitting: Internal Medicine

## 2021-08-10 NOTE — Telephone Encounter (Signed)
I would continue to follow BP   I would not make any changes prior to surgery

## 2021-08-15 NOTE — Discharge Instructions (Signed)
Instructions after Total Hip Replacement     Kacey Dysert P. Kallista Pae, Jr., M.D.     Dept. of Orthopaedics & Sports Medicine  [facility]  [address]  [place], [place]  [address]  Phone: [phone]   Fax: [phone]    DIET: . Drink plenty of non-alcoholic fluids. . Resume your normal diet. Include foods high in fiber.  ACTIVITY:  . You may use crutches or a walker with weight-bearing as tolerated, unless instructed otherwise. . You may be weaned off of the walker or crutches by your Physical Therapist.  . Do NOT reach below the level of your knees or cross your legs until allowed.    . Continue doing gentle exercises. Exercising will reduce the pain and swelling, increase motion, and prevent muscle weakness.   . Please continue to use the TED compression stockings for 6 weeks. You may remove the stockings at night, but should reapply them in the morning. . Do not drive or operate any equipment until instructed.  WOUND CARE:  . Continue to use ice packs periodically to reduce pain and swelling. . Keep the incision clean and dry. . You may bathe or shower after the staples are removed at the first office visit following surgery.  MEDICATIONS: . You may resume your regular medications. . Please take the pain medication as prescribed on the medication. . Do not take pain medication on an empty stomach. . You have been given a prescription for a blood thinner to prevent blood clots. Please take the medication as instructed. (NOTE: After completing a 2 week course of Lovenox, take one Enteric-coated aspirin once a day.) . Pain medications and iron supplements can cause constipation. Use a stool softener (Senokot or Colace) on a daily basis and a laxative (dulcolax or miralax) as needed. . Do not drive or drink alcoholic beverages when taking pain medications.  CALL THE OFFICE FOR: . Temperature above 101 degrees . Excessive bleeding or drainage on the dressing. . Excessive  swelling, coldness, or paleness of the toes. . Persistent nausea and vomiting.  FOLLOW-UP:  . You should have an appointment to return to the office in 6 weeks after surgery. . Arrangements have been made for continuation of Physical Therapy (either home therapy or outpatient therapy).     [facility] Department Directory         [email or web address]       [email or web address]          Cardiology  Appointments: [place] - [phone] [place] - [phone]  Endocrinology  Appointments: [place] - [phone] [place] - [phone]  Gastroenterology  Appointments: [place] - [phone] [place] - [phone]        General Surgery   Appointments: [place] - [phone]  Internal Medicine/Family Medicine  Appointments: [place] - [phone] [place] - [phone] [place] - [phone]  Metabolic and Weigh Loss Surgery  Appointments: [place] - [phone]        Neurology  Appointments: [place] - [phone] [place] - [phone]  Neurosurgery  Appointments: [place] - [phone]  Obstetrics & Gynecology  Appointments: [place] - [phone] [place] - [phone]        Pediatrics  Appointments: [place] - [phone] [place] - [phone]  Physiatry  Appointments: [place] -[phone]  Physical Therapy  Appointments: [place] - [phone] [place] - [phone]        Podiatry  Appointments: [place] - [phone] [place] - [phone]  Pulmonology  Appointments: [place] - [phone]  Rheumatology  Appointments: [place] - [phone]         Location: [facility]  [address] [place], [place]  [address]  [place] Location: [facility] [address] [place], [place]  [address]  [place] Location: [facility] [address] [place], [place]  [address]    

## 2021-08-17 ENCOUNTER — Ambulatory Visit
Admission: RE | Admit: 2021-08-17 | Discharge: 2021-08-17 | Disposition: A | Discharge status: Home / Self Care | Payer: Medicare Other | Source: Ambulatory Visit | Attending: Internal Medicine | Admitting: Internal Medicine

## 2021-08-17 ENCOUNTER — Encounter
Admission: RE | Admit: 2021-08-17 | Discharge: 2021-08-17 | Disposition: A | Discharge status: Home / Self Care | Payer: Medicare Other | Source: Ambulatory Visit | Attending: Orthopedic Surgery | Admitting: Orthopedic Surgery

## 2021-08-17 ENCOUNTER — Other Ambulatory Visit: Payer: Self-pay

## 2021-08-17 VITALS — BP 131/70 | HR 66 | Temp 97.6°F | Resp 18 | Wt 154.2 lb

## 2021-08-17 DIAGNOSIS — Z780 Asymptomatic menopausal state: Secondary | ICD-10-CM | POA: Diagnosis not present

## 2021-08-17 DIAGNOSIS — E034 Atrophy of thyroid (acquired): Secondary | ICD-10-CM

## 2021-08-17 DIAGNOSIS — M8589 Other specified disorders of bone density and structure, multiple sites: Secondary | ICD-10-CM

## 2021-08-17 DIAGNOSIS — M1612 Unilateral primary osteoarthritis, left hip: Secondary | ICD-10-CM | POA: Insufficient documentation

## 2021-08-17 DIAGNOSIS — Z01818 Encounter for other preprocedural examination: Secondary | ICD-10-CM

## 2021-08-17 DIAGNOSIS — M85859 Other specified disorders of bone density and structure, unspecified thigh: Secondary | ICD-10-CM | POA: Insufficient documentation

## 2021-08-17 DIAGNOSIS — Z01812 Encounter for preprocedural laboratory examination: Secondary | ICD-10-CM | POA: Insufficient documentation

## 2021-08-17 DIAGNOSIS — N3090 Cystitis, unspecified without hematuria: Secondary | ICD-10-CM | POA: Insufficient documentation

## 2021-08-17 DIAGNOSIS — Z1231 Encounter for screening mammogram for malignant neoplasm of breast: Secondary | ICD-10-CM | POA: Diagnosis not present

## 2021-08-17 DIAGNOSIS — Z13820 Encounter for screening for osteoporosis: Secondary | ICD-10-CM | POA: Insufficient documentation

## 2021-08-17 LAB — C-REACTIVE PROTEIN: CRP: 0.9 mg/dL (ref <1.0)

## 2021-08-17 LAB — COMPREHENSIVE METABOLIC PANEL
ALT: 23 U/L (ref 0–44)
AST: 24 U/L (ref 15–41)
Albumin: 4.6 g/dL (ref 3.5–5.0)
Alkaline Phosphatase: 75 U/L (ref 38–126)
Anion gap: 9 (ref 5–15)
BUN: 14 mg/dL (ref 8–23)
CO2: 29 mmol/L (ref 22–32)
Calcium: 9.5 mg/dL (ref 8.9–10.3)
Chloride: 103 mmol/L (ref 98–111)
Creatinine, Ser: 0.65 mg/dL (ref 0.44–1.00)
GFR, Estimated: >60 mL/min (ref >60)
Glucose, Bld: 101 mg/dL — ABNORMAL HIGH (ref 70–99)
Potassium: 3.7 mmol/L (ref 3.5–5.1)
Sodium: 141 mmol/L (ref 135–145)
Total Bilirubin: 0.6 mg/dL (ref 0.3–1.2)
Total Protein: 7.4 g/dL (ref 6.5–8.1)

## 2021-08-17 LAB — SEDIMENTATION RATE: Sed Rate: 2 mm/hr (ref 0–22)

## 2021-08-17 LAB — TYPE AND SCREEN
ABO/RH(D): A NEG
Antibody Screen: NEGATIVE

## 2021-08-17 LAB — URINALYSIS, ROUTINE W REFLEX MICROSCOPIC
Bilirubin Urine: NEGATIVE
Glucose, UA: NEGATIVE mg/dL
Hgb urine dipstick: NEGATIVE
Ketones, ur: NEGATIVE mg/dL
Nitrite: NEGATIVE
Protein, ur: NEGATIVE mg/dL
Specific Gravity, Urine: 1.019 (ref 1.005–1.030)
pH: 6 (ref 5.0–8.0)

## 2021-08-17 LAB — SURGICAL PCR SCREEN
MRSA, PCR: NEGATIVE
Staphylococcus aureus: NEGATIVE

## 2021-08-17 NOTE — Patient Instructions (Addendum)
Your procedure is scheduled on: Monday 08/31/21 Report to the Registration Desk on the 1st floor of the [facility]. To find out your arrival time, please call [phone] between 1PM - 3PM on: Friday 08/28/21  REMEMBER: Instructions that are not followed completely may result in serious medical risk, up to and including death; or upon the discretion of your surgeon and anesthesiologist your surgery may need to be rescheduled.  Do not eat food after midnight the night before surgery.  No gum chewing, lozengers or hard candies.  You may however, drink CLEAR liquids up to 2 hours before you are scheduled to arrive for your surgery. Do not drink anything within 2 hours of your scheduled arrival time.  Clear liquids include: - water  - apple juice without pulp - gatorade (not RED, PURPLE, OR BLUE) - black coffee or tea (Do NOT add milk or creamers to the coffee or tea) Do NOT drink anything that is not on this list.  In addition, your doctor has ordered for you to drink the provided  Ensure Pre-Surgery Clear Carbohydrate Drink  Drinking this carbohydrate drink up to two hours before surgery helps to reduce insulin resistance and improve patient outcomes. Please complete drinking 2 hours prior to scheduled arrival time.  TAKE THESE MEDICATIONS THE MORNING OF SURGERY WITH A SIP OF WATER: gabapentin (NEURONTIN) 100 MG capsule levothyroxine (SYNTHROID) 88 MCG tablet ALPRAZolam (XANAX) 0.25 MG tablet if needed  One week prior to surgery: Stop Anti-inflammatories (NSAIDS) such as Advil, Aleve, Ibuprofen, Motrin, Naproxen, Naprosyn and Aspirin based products such as Excedrin, Goodys Powder, BC Powder. Stop taking your Cholecalciferol (VITAMIN D3) 1000 units CAPS, Multiple Vitamin (MULTIVITAMIN) tablet and ANY other OVER THE COUNTER supplements until after surgery.  You may however, continue to take Tylenol if needed for pain up until the day of surgery.  No Alcohol for 24 hours before or  after surgery.  No Smoking including e-cigarettes for 24 hours prior to surgery.  No chewable tobacco products for at least 6 hours prior to surgery.  No nicotine patches on the day of surgery.  Do not use any "recreational" drugs for at least a week prior to your surgery.  Please be advised that the combination of cocaine and anesthesia may have negative outcomes, up to and including death. If you test positive for cocaine, your surgery will be cancelled.  On the morning of surgery brush your teeth with toothpaste and water, you may rinse your mouth with mouthwash if you wish. Do not swallow any toothpaste or mouthwash.  Use CHG Soap as directed on instruction sheet.  Do not wear jewelry, make-up, hairpins, clips or nail polish.  Do not wear lotions, powders, or perfumes.   Do not shave body from the neck down 48 hours prior to surgery just in case you cut yourself which could leave a site for infection.  Also, freshly shaved skin may become irritated if using the CHG soap.  Do not bring valuables to the hospital. [facility] is not responsible for any missing/lost belongings or valuables.   Notify your doctor if there is any change in your medical condition (cold, fever, infection).  Wear comfortable clothing (specific to your surgery type) to the hospital.  After surgery, you can help prevent lung complications by doing breathing exercises.  Take deep breaths and cough every 1-2 hours. Your doctor may order a device called an Incentive Spirometer to help you take deep breaths.  If you are being admitted to the  hospital overnight, leave your suitcase in the car. After surgery it may be brought to your room.  If you are taking public transportation, you will need to have a responsible adult (18 years or older) with you. Please confirm with your physician that it is acceptable to use public transportation.   Please call the Pre-admissions Testing Dept. at [phone] if you  have any questions about these instructions.  Surgery Visitation Policy:  Patients undergoing a surgery or procedure may have one family member or support person with them as long as that person is not COVID-19 positive or experiencing its symptoms.  That person may remain in the waiting area during the procedure and may rotate out with other people.  Inpatient Visitation:    Visiting hours are 7 a.m. to 8 p.m. Up to two visitors ages 12+ are allowed at one time in a patient room. The visitors may rotate out with other people during the day. Visitors must check out when they leave, or other visitors will not be allowed. One designated support person may remain overnight. The visitor must pass COVID-19 screenings, use hand sanitizer when entering and exiting the patient's room and wear a mask at all times, including in the patient's room. Patients must also wear a mask when staff or their visitor are in the room. Masking is required regardless of vaccination status.

## 2021-08-19 LAB — URINE CULTURE: Culture: NO GROWTH

## 2021-08-28 ENCOUNTER — Other Ambulatory Visit
Admission: RE | Admit: 2021-08-28 | Discharge: 2021-08-28 | Disposition: A | Discharge status: Home / Self Care | Payer: Medicare Other | Source: Ambulatory Visit | Attending: Orthopedic Surgery | Admitting: Orthopedic Surgery

## 2021-08-28 ENCOUNTER — Other Ambulatory Visit: Payer: Self-pay

## 2021-08-28 DIAGNOSIS — Z20822 Contact with and (suspected) exposure to covid-19: Secondary | ICD-10-CM | POA: Diagnosis not present

## 2021-08-28 DIAGNOSIS — Z01812 Encounter for preprocedural laboratory examination: Secondary | ICD-10-CM | POA: Insufficient documentation

## 2021-08-28 LAB — SARS CORONAVIRUS 2 (TAT 6-24 HRS): SARS Coronavirus 2: NEGATIVE

## 2021-08-30 ENCOUNTER — Encounter: Payer: Self-pay | Admitting: Orthopedic Surgery

## 2021-08-30 DIAGNOSIS — R194 Change in bowel habit: Secondary | ICD-10-CM | POA: Insufficient documentation

## 2021-08-30 DIAGNOSIS — M79603 Pain in arm, unspecified: Secondary | ICD-10-CM | POA: Insufficient documentation

## 2021-08-30 DIAGNOSIS — K625 Hemorrhage of anus and rectum: Secondary | ICD-10-CM | POA: Insufficient documentation

## 2021-08-30 NOTE — H&P (Signed)
ORTHOPAEDIC HISTORY & PHYSICAL Michelene Gardener, [place] - 08/21/2021 2:45 PM EST Formatting of this note is different from the original. [facility]IC[facility] Chief Complaint:   Chief Complaint  Patient presents with   Left Hip - Pain  History & Physical Left THA 08/31/21 [facility]   History of Present Illness:   Theresa [name] is a 65 y.o. female that presents to clinic today for her preoperative history and evaluation. Patient presents with her husband. The patient is scheduled to undergo a left total hip arthroplasty on 08/31/21 by Dr. Ernest Pine. Her pain began proximately 6 months ago while doing yoga exercises. The pain is located over the left lateral hip and groin. She describes her pain as worse with weightbearing and extremes of motion. She denies associated numbness or tingling. Of note, the patient is followed by Dr. Marikay Alar for cervical and lumbar spine issues. A cervical MRI from 02/26/2020 demonstrated a C3-7 ACDF with moderate bilateral neuroforaminal stenosis at C5-6 and C6-7. A lumbar MRI on the same date demonstrated the prior L3-4 PLIF with progressive severe adjacent segment degenerative disc disease at L4-5 with moderate spinal stenosis, bilateral recess and left neural foraminal stenosis. Dr. Yetta Barre ordered the left hip MRI to determine if hip pathology was contributing to the patient's symptoms.the MRI revealed avascular necrosis of the femoral head as well as a subchondral fracture of the femoral head.  The patient's symptoms have progressed to the point that they decrease her quality of life. The patient has previously undergone conservative treatment including narcotic analgesics and activity modification without adequate control of her symptoms.  Patient does have a history of lumbar fusion of L3-L4. Patient does see Dr. Dietrich Pates in cardiology and has received clearance. Denies history of blood clots.   Past Medical, Surgical, Family, Social  History, Allergies, Medications:   Past Medical History:  Past Medical History:  Diagnosis Date   Chicken pox   H/O degenerative disc disease   Migraines   Thyroid disease   Past Surgical History:  Past Surgical History:  Procedure Laterality Date   BACK SURGERY   CHOLECYSTECTOMY   HYSTERECTOMY   KNEE ARTHROSCOPY Right 01/2010   NECK FUSION 02/15; 12/13   Current Medications:  Current Outpatient Medications  Medication Sig Dispense Refill   ALPRAZolam (XANAX) 0.25 MG tablet TAKE ONE TABLET BY MOUTH TWICE DAILY AS NEEDED FOR ANXIETY   cetirizine (ZYRTEC) 10 MG tablet Take 1 tablet by mouth once daily   cholecalciferol (VITAMIN D3) 1,000 unit capsule Take 1,000 Units by mouth once daily.    gabapentin (NEURONTIN) 100 MG capsule Take 100 mg by mouth 2 (two) times daily   HYDROcodone-acetaminophen (NORCO) 5-325 mg tablet take 1 tablet by oral route QHS prn pain   levothyroxine (SYNTHROID, LEVOTHROID) 88 MCG tablet Take 88 mcg by mouth once daily.    magnesium 250 mg Tab Take 1 tablet by mouth once daily   melatonin 10 mg Tab Take 1 tablet by mouth nightly as needed   multivitamin tablet Take 1 tablet by mouth once daily   No current facility-administered medications for this visit.   Allergies:  Allergies  Allergen Reactions   Meloxicam Dizziness   Topiramate Other (See Comments)  Caused hair loss   Tramadol Itching   Social History:  Social History   Socioeconomic History   Marital status: Married  Spouse name: John   Number of children: 1   Years of education: 14   Highest education  level: Associate degree: occupational, Scientist, product/process development, or vocational program  Occupational History   Occupation: Retired- Armed forces operational officer  Tobacco Use   Smoking status: Never   Smokeless tobacco: Never  Vaping Use   Vaping Use: Never used  Substance and Sexual Activity   Alcohol use: Yes  Alcohol/week: 7.0 standard drinks  Types: 7 Glasses of wine per week  Comment: daily   Drug  use: No   Sexual activity: Yes  Partners: Male  Birth control/protection: Post-menopausal, Surgical   Family History:  Family History  Problem Relation Age of Onset   Alzheimer's disease Mother   No Known Problems Father   Breast cancer Sister   No Known Problems Brother   Review of Systems:   A 10+ ROS was performed, reviewed, and the pertinent orthopaedic findings are documented in the HPI.   Physical Examination:   BP (!) 146/82 (BP Location: Left upper arm, Patient Position: Sitting, BP Cuff Size: Adult)   Ht 170.2 cm (5\' 7" )   Wt 70.2 kg (154 lb 12.8 oz)   BMI 24.25 kg/m   Patient is a well-developed, well-nourished female in no acute distress. Patient has normal mood and affect. Patient is alert and oriented to person, place, and time.   HEENT: Atraumatic, normocephalic. Pupils equal and reactive to light. Extraocular motion intact. Noninjected sclera.  Cardiovascular: Regular rate and rhythm, with no murmurs, rubs, or gallops. Distal pulses palpable.  Respiratory: Lungs clear to auscultation bilaterally.   Left Hip: Pelvic tilt: Negative Limb lengths: Equal with the patient standing Soft tissue swelling: Negative Erythema: Negative Crepitance: Negative Tenderness: Greater trochanter is nontender to palpation. Moderate pain is elicited by axial compression or extremes of rotation. Atrophy: No atrophy. Fair to good hip flexor and abductor strength. Range of Motion: EXT/FLEX: 0/0/100 ADD/ABD: 20/0/20 IR/ER: 20/0/35   Sensation is intact over the saphenous, lateral cutaneous, superficial fibular, and deep fibular nerve distributions.  Tests Performed/Reviewed:  X-rays  Anteroposterior view of the pelvis is anteroposterior and lateral views of the left hip were reviewed. Images show mild loss of superior femoral acetabular joint space when compared to contralateral side. No other osseous abnormality noted. No fracture or dislocation.  MR OF THE LEFT HIP WITHOUT  CONTRAST   TECHNIQUE:  Multiplanar, multisequence MR imaging was performed. No intravenous  contrast was administered.   COMPARISON:  X-ray 05/21/2021   FINDINGS:  Bones: Subchondral fracture of the superomedial aspect of the left  femoral head with adjacent bone marrow edema (series 3, images  21-22). There may be a component of underlying avascular necrosis.  Additional bone marrow edema in the medial aspect of the left  femoral neck with suspected developing trabecular fracture line  (series 6, image 14). No cortical disruption.   Bony pelvis intact without fracture or diastasis. Right femoral head  within normal limits. No additional sites of bone marrow edema. No  suspicious marrow replacing bone lesion. Susceptibility artifact  related to prior lumbar fusion. Degenerative disc disease at L4-5,  incompletely assessed.   Articular cartilage and labrum   Articular cartilage: Chondral thinning and surface irregularity  throughout the left hip joint.   Labrum: Anterosuperior labral tear with associated 2.1 x 0.9 x 1.0  cm paralabral cyst (series 5, images 8-9).   Joint or bursal effusion   Joint effusion:  Small hip joint effusion.   Bursae: No abnormal bursal fluid collection.   Muscles and tendons   Muscles and tendons: Mild tendinosis of the bilateral hamstring  tendon origins. The  gluteal, iliopsoas, rectus femoris, and adductor  tendons appear intact without tear or significant tendinosis. Normal  muscle bulk and signal intensity without edema, atrophy, or fatty  infiltration.   Other findings   Miscellaneous: No soft tissue edema or fluid collection. No inguinal  lymphadenopathy. Scattered sigmoid diverticulosis. No acute findings  are evident within the pelvis.   IMPRESSION:  1. Subchondral fracture of the superomedial aspect of the left  femoral head with adjacent bone marrow edema. There may be a  component of underlying avascular necrosis.  2.  Additional bone marrow edema in the medial aspect of the left  femoral neck with suspected developing trabecular fracture line  although no cortical disruption is evident at this time.  3. Small left hip joint effusion.  4. Mild left hip osteoarthritis with anterosuperior labral tear and  associated 2.1 cm paralabral cyst.  5. Mild bilateral hamstring tendinosis.   These results will be called to the ordering clinician or  representative by the Radiologist Assistant, and communication  documented in the PACS or [employer].   Electronically Signed    By: Duanne Guess D.O.    On: 06/09/2021 14:32  Impression:   ICD-10-CM  1. 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 It was explained to the patient that the condition is progressive in nature. Having failed conservative treatment, the patient has elected to proceed with a total joint arthroplasty. The patient will undergo a total joint arthroplasty with Dr. Ernest Pine. The risks of surgery, including blood clot and infection, were discussed with the patient. Measures to reduce these risks, including the use of anticoagulation, perioperative antibiotics, and early ambulation were discussed. The importance of postoperative physical therapy was discussed with the patient. The patient elects to proceed with surgery. The patient is instructed to stop all blood thinners prior to surgery. The patient is instructed to call the hospital the day before surgery to learn of the proper arrival time.   Contact our office with any questions or concerns. Follow up as indicated, or sooner should any new problems arise, if conditions worsen, or if they are otherwise concerned.   Michelene Gardener, PA-C [facility] Orthopaedics and Sports Medicine [address] [place], [place] [address] Phone: [phone]  This note was generated in part  with voice recognition software and I apologize for any typographical errors that were not detected and corrected.  Electronically signed by Michelene Gardener, PA at 08/30/2021 8:51 PM EST

## 2021-08-31 ENCOUNTER — Ambulatory Visit: Payer: Medicare Other | Admitting: Urgent Care

## 2021-08-31 ENCOUNTER — Other Ambulatory Visit: Payer: Self-pay

## 2021-08-31 ENCOUNTER — Encounter
Admission: RE | Disposition: A | Discharge status: Home / Self Care | Payer: Self-pay | Source: Home / Self Care | Attending: Orthopedic Surgery

## 2021-08-31 ENCOUNTER — Observation Stay
Admission: RE | Admit: 2021-08-31 | Discharge: 2021-09-01 | Disposition: A | Discharge status: Home / Self Care | Payer: Medicare Other | Attending: Orthopedic Surgery | Admitting: Orthopedic Surgery

## 2021-08-31 ENCOUNTER — Observation Stay: Payer: Medicare Other

## 2021-08-31 ENCOUNTER — Encounter: Payer: Self-pay | Admitting: Orthopedic Surgery

## 2021-08-31 DIAGNOSIS — Z1231 Encounter for screening mammogram for malignant neoplasm of breast: Secondary | ICD-10-CM

## 2021-08-31 DIAGNOSIS — E039 Hypothyroidism, unspecified: Secondary | ICD-10-CM | POA: Diagnosis not present

## 2021-08-31 DIAGNOSIS — Z1211 Encounter for screening for malignant neoplasm of colon: Secondary | ICD-10-CM

## 2021-08-31 DIAGNOSIS — M1612 Unilateral primary osteoarthritis, left hip: Secondary | ICD-10-CM | POA: Diagnosis present

## 2021-08-31 DIAGNOSIS — Z79899 Other long term (current) drug therapy: Secondary | ICD-10-CM | POA: Diagnosis not present

## 2021-08-31 DIAGNOSIS — M85859 Other specified disorders of bone density and structure, unspecified thigh: Secondary | ICD-10-CM

## 2021-08-31 DIAGNOSIS — E034 Atrophy of thyroid (acquired): Secondary | ICD-10-CM

## 2021-08-31 DIAGNOSIS — Z96649 Presence of unspecified artificial hip joint: Secondary | ICD-10-CM

## 2021-08-31 DIAGNOSIS — N3090 Cystitis, unspecified without hematuria: Secondary | ICD-10-CM

## 2021-08-31 DIAGNOSIS — Z96642 Presence of left artificial hip joint: Secondary | ICD-10-CM

## 2021-08-31 HISTORY — PX: TOTAL HIP ARTHROPLASTY: SHX124

## 2021-08-31 SURGERY — ARTHROPLASTY, HIP, TOTAL,POSTERIOR APPROACH
Anesthesia: Spinal | Site: Hip | Laterality: Left

## 2021-08-31 MED ORDER — ONDANSETRON HCL 4 MG PO TABS
4.0000 mg | ORAL_TABLET | Freq: Four times a day (QID) | ORAL | Status: DC | PRN
  Filled 2021-08-31: qty 1

## 2021-08-31 MED ORDER — GABAPENTIN 300 MG PO CAPS
ORAL_CAPSULE | ORAL | Status: AC
  Administered 2021-08-31: 17:00:00 200 mg via ORAL
  Filled 2021-08-31: qty 1

## 2021-08-31 MED ORDER — TRANEXAMIC ACID-NACL 1000-0.7 MG/100ML-% IV SOLN
INTRAVENOUS | Status: AC
  Filled 2021-08-31: qty 100

## 2021-08-31 MED ORDER — METOCLOPRAMIDE HCL 10 MG PO TABS
10.0000 mg | ORAL_TABLET | Freq: Three times a day (TID) | ORAL | Status: DC
  Administered 2021-08-31 – 2021-09-01 (×3): 10 mg via ORAL

## 2021-08-31 MED ORDER — MIDAZOLAM HCL 2 MG/2ML IJ SOLN
INTRAMUSCULAR | Status: AC
  Filled 2021-08-31: qty 2

## 2021-08-31 MED ORDER — CELECOXIB 200 MG PO CAPS
ORAL_CAPSULE | ORAL | Status: AC
  Filled 2021-08-31: qty 1

## 2021-08-31 MED ORDER — MIDAZOLAM HCL 5 MG/5ML IJ SOLN
INTRAMUSCULAR | Status: DC | PRN
  Administered 2021-08-31: 2 mg via INTRAVENOUS

## 2021-08-31 MED ORDER — ONDANSETRON HCL 4 MG/2ML IJ SOLN: 4.0000 mg | Freq: Four times a day (QID) | INTRAMUSCULAR | Status: DC | PRN

## 2021-08-31 MED ORDER — ACETAMINOPHEN 10 MG/ML IV SOLN: 1000.0000 mg | Freq: Once | INTRAVENOUS | Status: DC | PRN

## 2021-08-31 MED ORDER — GABAPENTIN 100 MG PO CAPS
ORAL_CAPSULE | ORAL | Status: AC
  Filled 2021-08-31: qty 2

## 2021-08-31 MED ORDER — ONDANSETRON HCL 4 MG/2ML IJ SOLN: 4.0000 mg | Freq: Once | INTRAMUSCULAR | Status: DC | PRN

## 2021-08-31 MED ORDER — LORATADINE 10 MG PO TABS
10.0000 mg | ORAL_TABLET | Freq: Every day | ORAL | Status: DC
  Administered 2021-08-31 – 2021-09-01 (×2): 10 mg via ORAL
  Filled 2021-08-31 (×2): qty 1

## 2021-08-31 MED ORDER — EPHEDRINE SULFATE 50 MG/ML IJ SOLN
INTRAMUSCULAR | Status: DC | PRN
  Administered 2021-08-31 (×4): 5 mg via INTRAVENOUS

## 2021-08-31 MED ORDER — TRANEXAMIC ACID-NACL 1000-0.7 MG/100ML-% IV SOLN
1000.0000 mg | INTRAVENOUS | Status: AC
  Administered 2021-08-31: 08:00:00 1000 mg via INTRAVENOUS

## 2021-08-31 MED ORDER — PROPOFOL 500 MG/50ML IV EMUL
INTRAVENOUS | Status: DC | PRN
Start: 2021-08-31 — End: 2021-08-31
  Administered 2021-08-31: 200 ug/kg/min via INTRAVENOUS

## 2021-08-31 MED ORDER — FAMOTIDINE 20 MG PO TABS
ORAL_TABLET | ORAL | Status: AC
  Administered 2021-08-31: 07:00:00 20 mg via ORAL
  Filled 2021-08-31: qty 1

## 2021-08-31 MED ORDER — HYDROMORPHONE HCL 1 MG/ML IJ SOLN
INTRAMUSCULAR | Status: AC
  Filled 2021-08-31: qty 1

## 2021-08-31 MED ORDER — FENTANYL CITRATE (PF) 100 MCG/2ML IJ SOLN
INTRAMUSCULAR | Status: AC
  Filled 2021-08-31: qty 2

## 2021-08-31 MED ORDER — FLEET ENEMA 7-19 GM/118ML RE ENEM: 1.0000 | ENEMA | Freq: Once | RECTAL | Status: DC | PRN

## 2021-08-31 MED ORDER — SODIUM CHLORIDE 0.9 % IV BOLUS
1000.0000 mL | INTRAVENOUS | Status: AC
  Administered 2021-08-31: 15:00:00 1000 mL via INTRAVENOUS

## 2021-08-31 MED ORDER — ACETAMINOPHEN 10 MG/ML IV SOLN
1000.0000 mg | Freq: Four times a day (QID) | INTRAVENOUS | Status: AC
  Administered 2021-08-31 – 2021-09-01 (×3): 1000 mg via INTRAVENOUS

## 2021-08-31 MED ORDER — CHLORHEXIDINE GLUCONATE 4 % EX LIQD: 60.0000 mL | Freq: Once | CUTANEOUS | Status: DC

## 2021-08-31 MED ORDER — ACETAMINOPHEN 10 MG/ML IV SOLN
INTRAVENOUS | Status: AC
  Administered 2021-08-31: 16:00:00 1000 mg via INTRAVENOUS
  Filled 2021-08-31: qty 100

## 2021-08-31 MED ORDER — CEFAZOLIN SODIUM-DEXTROSE 2-4 GM/100ML-% IV SOLN
INTRAVENOUS | Status: AC
  Filled 2021-08-31: qty 100

## 2021-08-31 MED ORDER — METOCLOPRAMIDE HCL 10 MG PO TABS
ORAL_TABLET | ORAL | Status: AC
  Administered 2021-08-31: 17:00:00 10 mg via ORAL
  Filled 2021-08-31: qty 1

## 2021-08-31 MED ORDER — 0.9 % SODIUM CHLORIDE (POUR BTL) OPTIME: TOPICAL | Status: DC | PRN

## 2021-08-31 MED ORDER — BISACODYL 10 MG RE SUPP
10.0000 mg | Freq: Every day | RECTAL | Status: DC | PRN
  Filled 2021-08-31: qty 1

## 2021-08-31 MED ORDER — OXYCODONE HCL 5 MG PO TABS
ORAL_TABLET | ORAL | Status: AC
  Filled 2021-08-31: qty 1

## 2021-08-31 MED ORDER — OXYCODONE HCL 5 MG PO TABS
5.0000 mg | ORAL_TABLET | ORAL | Status: DC | PRN
  Administered 2021-08-31 – 2021-09-01 (×4): 5 mg via ORAL

## 2021-08-31 MED ORDER — PHENOL 1.4 % MT LIQD
1.0000 | OROMUCOSAL | Status: DC | PRN
  Filled 2021-08-31: qty 177

## 2021-08-31 MED ORDER — FENTANYL CITRATE (PF) 100 MCG/2ML IJ SOLN
INTRAMUSCULAR | Status: DC | PRN
  Administered 2021-08-31: 50 ug via INTRAVENOUS

## 2021-08-31 MED ORDER — CEFAZOLIN SODIUM-DEXTROSE 2-4 GM/100ML-% IV SOLN
INTRAVENOUS | Status: AC
  Administered 2021-08-31: 18:00:00 2000 mg
  Filled 2021-08-31: qty 100

## 2021-08-31 MED ORDER — TRANEXAMIC ACID-NACL 1000-0.7 MG/100ML-% IV SOLN
INTRAVENOUS | Status: AC
  Administered 2021-08-31: 11:00:00 1000 mg via INTRAVENOUS
  Filled 2021-08-31: qty 100

## 2021-08-31 MED ORDER — EPHEDRINE 5 MG/ML INJ
INTRAVENOUS | Status: AC
  Filled 2021-08-31: qty 5

## 2021-08-31 MED ORDER — TRANEXAMIC ACID-NACL 1000-0.7 MG/100ML-% IV SOLN: 1000.0000 mg | Freq: Once | INTRAVENOUS | Status: AC

## 2021-08-31 MED ORDER — VITAMIN D3 25 MCG (1000 UNIT) PO TABS
1000.0000 [IU] | ORAL_TABLET | Freq: Every day | ORAL | Status: DC
Start: 2021-08-31 — End: 2021-09-01
  Administered 2021-08-31 – 2021-09-01 (×2): 1000 [IU] via ORAL
  Filled 2021-08-31 (×2): qty 1

## 2021-08-31 MED ORDER — ENSURE PRE-SURGERY PO LIQD
296.0000 mL | Freq: Once | ORAL | Status: DC
  Filled 2021-08-31: qty 296

## 2021-08-31 MED ORDER — CEFAZOLIN SODIUM-DEXTROSE 2-4 GM/100ML-% IV SOLN: 2.0000 g | Freq: Four times a day (QID) | INTRAVENOUS | Status: AC

## 2021-08-31 MED ORDER — SODIUM CHLORIDE 0.9 % IV BOLUS
500.0000 mL | INTRAVENOUS | Status: AC
  Administered 2021-08-31: 17:00:00 500 mL via INTRAVENOUS

## 2021-08-31 MED ORDER — METOCLOPRAMIDE HCL 10 MG PO TABS
ORAL_TABLET | ORAL | Status: AC
  Administered 2021-08-31: 12:00:00 10 mg via ORAL
  Filled 2021-08-31: qty 1

## 2021-08-31 MED ORDER — DEXAMETHASONE SODIUM PHOSPHATE 10 MG/ML IJ SOLN
INTRAMUSCULAR | Status: AC
  Administered 2021-08-31: 07:00:00 8 mg via INTRAVENOUS
  Filled 2021-08-31: qty 1

## 2021-08-31 MED ORDER — SODIUM CHLORIDE 0.9 % IR SOLN: Status: DC | PRN

## 2021-08-31 MED ORDER — BUPIVACAINE HCL (PF) 0.5 % IJ SOLN
INTRAMUSCULAR | Status: DC | PRN
  Administered 2021-08-31: 3 mL

## 2021-08-31 MED ORDER — DIPHENHYDRAMINE HCL 12.5 MG/5ML PO ELIX
12.5000 mg | ORAL_SOLUTION | ORAL | Status: DC | PRN
  Filled 2021-08-31: qty 10

## 2021-08-31 MED ORDER — AMLODIPINE BESYLATE 2.5 MG PO TABS
1.2500 mg | ORAL_TABLET | Freq: Every day | ORAL | Status: DC
  Filled 2021-08-31: qty 0.5

## 2021-08-31 MED ORDER — FERROUS SULFATE 325 (65 FE) MG PO TABS
325.0000 mg | ORAL_TABLET | Freq: Two times a day (BID) | ORAL | Status: DC
  Administered 2021-08-31 – 2021-09-01 (×2): 325 mg via ORAL
  Filled 2021-08-31 (×4): qty 1

## 2021-08-31 MED ORDER — ACETAMINOPHEN 10 MG/ML IV SOLN
INTRAVENOUS | Status: DC | PRN
  Administered 2021-08-31: 1000 mg via INTRAVENOUS

## 2021-08-31 MED ORDER — ONDANSETRON HCL 4 MG/2ML IJ SOLN
INTRAMUSCULAR | Status: DC | PRN
  Administered 2021-08-31: 4 mg via INTRAVENOUS

## 2021-08-31 MED ORDER — FENTANYL CITRATE (PF) 100 MCG/2ML IJ SOLN: 25.0000 ug | INTRAMUSCULAR | Status: DC | PRN

## 2021-08-31 MED ORDER — OXYCODONE HCL 5 MG PO TABS: 10.0000 mg | ORAL_TABLET | ORAL | Status: DC | PRN

## 2021-08-31 MED ORDER — SODIUM CHLORIDE 0.9 % IR SOLN
Status: DC | PRN
  Administered 2021-08-31: 08:00:00 3012 mL

## 2021-08-31 MED ORDER — GABAPENTIN 300 MG PO CAPS: 300.0000 mg | ORAL_CAPSULE | Freq: Once | ORAL | Status: DC

## 2021-08-31 MED ORDER — ALPRAZOLAM 0.5 MG PO TABS: 0.2500 mg | ORAL_TABLET | Freq: Every evening | ORAL | Status: DC | PRN

## 2021-08-31 MED ORDER — ENOXAPARIN SODIUM 30 MG/0.3ML IJ SOSY
30.0000 mg | PREFILLED_SYRINGE | Freq: Two times a day (BID) | INTRAMUSCULAR | Status: DC
  Administered 2021-09-01: 08:00:00 30 mg via SUBCUTANEOUS
  Filled 2021-08-31 (×4): qty 0.3

## 2021-08-31 MED ORDER — ACETAMINOPHEN 10 MG/ML IV SOLN
INTRAVENOUS | Status: AC
  Filled 2021-08-31: qty 100

## 2021-08-31 MED ORDER — LEVOTHYROXINE SODIUM 88 MCG PO TABS
88.0000 ug | ORAL_TABLET | Freq: Every day | ORAL | Status: DC
Start: 2021-09-01 — End: 2021-09-01
  Administered 2021-09-01: 08:00:00 88 ug via ORAL
  Filled 2021-08-31 (×3): qty 1

## 2021-08-31 MED ORDER — CHLORHEXIDINE GLUCONATE 0.12 % MT SOLN: 15.0000 mL | Freq: Once | OROMUCOSAL | Status: AC

## 2021-08-31 MED ORDER — GABAPENTIN 100 MG PO CAPS: 200.0000 mg | ORAL_CAPSULE | Freq: Two times a day (BID) | ORAL | Status: DC

## 2021-08-31 MED ORDER — HYDROMORPHONE HCL 1 MG/ML IJ SOLN
0.5000 mg | INTRAMUSCULAR | Status: DC | PRN
  Administered 2021-08-31 (×2): 0.5 mg via INTRAVENOUS

## 2021-08-31 MED ORDER — ADULT MULTIVITAMIN W/MINERALS CH
1.0000 | ORAL_TABLET | Freq: Every day | ORAL | Status: DC
  Administered 2021-08-31 – 2021-09-01 (×2): 1 via ORAL
  Filled 2021-08-31 (×2): qty 1

## 2021-08-31 MED ORDER — CHLORHEXIDINE GLUCONATE 0.12 % MT SOLN
OROMUCOSAL | Status: AC
  Administered 2021-08-31: 07:00:00 15 mL via OROMUCOSAL
  Filled 2021-08-31: qty 15

## 2021-08-31 MED ORDER — SODIUM CHLORIDE 0.9 % IV SOLN: INTRAVENOUS | Status: DC

## 2021-08-31 MED ORDER — ALUM & MAG HYDROXIDE-SIMETH 200-200-20 MG/5ML PO SUSP: 30.0000 mL | ORAL | Status: DC | PRN

## 2021-08-31 MED ORDER — ORAL CARE MOUTH RINSE: 15.0000 mL | Freq: Once | OROMUCOSAL | Status: AC

## 2021-08-31 MED ORDER — CELECOXIB 200 MG PO CAPS: 400.0000 mg | ORAL_CAPSULE | Freq: Once | ORAL | Status: AC

## 2021-08-31 MED ORDER — PRONTOSAN WOUND IRRIGATION OPTIME
TOPICAL | Status: DC | PRN
  Administered 2021-08-31: 1 via TOPICAL

## 2021-08-31 MED ORDER — LACTATED RINGERS IV SOLN: INTRAVENOUS | Status: DC

## 2021-08-31 MED ORDER — POLYVINYL ALCOHOL 1.4 % OP SOLN
1.0000 [drp] | Freq: Two times a day (BID) | OPHTHALMIC | Status: DC | PRN
  Filled 2021-08-31: qty 15

## 2021-08-31 MED ORDER — HYDROMORPHONE HCL 1 MG/ML IJ SOLN
INTRAMUSCULAR | Status: AC
  Filled 2021-08-31: qty 0.5

## 2021-08-31 MED ORDER — CEFAZOLIN SODIUM-DEXTROSE 2-4 GM/100ML-% IV SOLN
INTRAVENOUS | Status: AC
  Administered 2021-08-31: 13:00:00 2 g via INTRAVENOUS
  Filled 2021-08-31: qty 100

## 2021-08-31 MED ORDER — PROPOFOL 1000 MG/100ML IV EMUL
INTRAVENOUS | Status: AC
  Filled 2021-08-31: qty 100

## 2021-08-31 MED ORDER — CEFAZOLIN SODIUM-DEXTROSE 2-4 GM/100ML-% IV SOLN
2.0000 g | INTRAVENOUS | Status: AC
  Administered 2021-08-31: 08:00:00 2 g via INTRAVENOUS

## 2021-08-31 MED ORDER — SENNOSIDES-DOCUSATE SODIUM 8.6-50 MG PO TABS
1.0000 | ORAL_TABLET | Freq: Two times a day (BID) | ORAL | Status: DC
  Administered 2021-08-31 – 2021-09-01 (×2): 1 via ORAL
  Filled 2021-08-31 (×3): qty 1

## 2021-08-31 MED ORDER — MENTHOL 3 MG MT LOZG
1.0000 | LOZENGE | OROMUCOSAL | Status: DC | PRN
  Filled 2021-08-31: qty 9

## 2021-08-31 MED ORDER — PHENYLEPHRINE HCL-NACL 20-0.9 MG/250ML-% IV SOLN
INTRAVENOUS | Status: DC | PRN
  Administered 2021-08-31: 70 ug/min via INTRAVENOUS

## 2021-08-31 MED ORDER — MAGNESIUM HYDROXIDE 400 MG/5ML PO SUSP
ORAL | Status: AC
  Administered 2021-08-31: 12:00:00 30 mL via ORAL
  Filled 2021-08-31: qty 30

## 2021-08-31 MED ORDER — DEXAMETHASONE SODIUM PHOSPHATE 10 MG/ML IJ SOLN: 8.0000 mg | Freq: Once | INTRAMUSCULAR | Status: AC

## 2021-08-31 MED ORDER — PANTOPRAZOLE SODIUM 40 MG PO TBEC
40.0000 mg | DELAYED_RELEASE_TABLET | Freq: Two times a day (BID) | ORAL | Status: DC
  Administered 2021-08-31 – 2021-09-01 (×3): 40 mg via ORAL
  Filled 2021-08-31 (×4): qty 1

## 2021-08-31 MED ORDER — CELECOXIB 200 MG PO CAPS
ORAL_CAPSULE | ORAL | Status: AC
  Administered 2021-08-31: 07:00:00 400 mg via ORAL
  Filled 2021-08-31: qty 2

## 2021-08-31 MED ORDER — MAGNESIUM HYDROXIDE 400 MG/5ML PO SUSP: 30.0000 mL | Freq: Every day | ORAL | Status: DC

## 2021-08-31 MED ORDER — GABAPENTIN 300 MG PO CAPS
ORAL_CAPSULE | ORAL | Status: AC
  Filled 2021-08-31: qty 1

## 2021-08-31 MED ORDER — FAMOTIDINE 20 MG PO TABS: 20.0000 mg | ORAL_TABLET | Freq: Once | ORAL | Status: AC

## 2021-08-31 MED ORDER — CELECOXIB 200 MG PO CAPS
200.0000 mg | ORAL_CAPSULE | Freq: Two times a day (BID) | ORAL | Status: DC
  Administered 2021-08-31: 22:00:00 200 mg via ORAL

## 2021-08-31 MED ORDER — ACETAMINOPHEN 325 MG PO TABS: 325.0000 mg | ORAL_TABLET | Freq: Four times a day (QID) | ORAL | Status: DC | PRN

## 2021-08-31 MED ORDER — METOCLOPRAMIDE HCL 10 MG PO TABS
ORAL_TABLET | ORAL | Status: AC
  Filled 2021-08-31: qty 1

## 2021-08-31 SURGICAL SUPPLY — 62 items
BLADE DRUM FLTD (BLADE) ×3 IMPLANT
BLADE SAW 90X25X1.19 OSCILLAT (BLADE) ×3 IMPLANT
CARTRIDGE OIL MAESTRO DRILL (MISCELLANEOUS) ×1 IMPLANT
CUP ACETBLR 54 OD 100 SERIES (Hips) ×2 IMPLANT
DIFFUSER DRILL AIR PNEUMATIC (MISCELLANEOUS) ×3 IMPLANT
DRAPE 3/4 80X56 (DRAPES) ×3 IMPLANT
DRAPE INCISE IOBAN 66X60 STRL (DRAPES) ×3 IMPLANT
DRSG DERMACEA 8X12 NADH (GAUZE/BANDAGES/DRESSINGS) ×3 IMPLANT
DRSG MEPILEX SACRM 8.7X9.8 (GAUZE/BANDAGES/DRESSINGS) ×3 IMPLANT
DRSG OPSITE POSTOP 4X12 (GAUZE/BANDAGES/DRESSINGS) ×3 IMPLANT
DRSG OPSITE POSTOP 4X14 (GAUZE/BANDAGES/DRESSINGS) IMPLANT
DRSG TEGADERM 4X4.75 (GAUZE/BANDAGES/DRESSINGS) ×3 IMPLANT
DURAPREP 26ML APPLICATOR (WOUND CARE) ×3 IMPLANT
ELECT CAUTERY BLADE 6.4 (BLADE) ×3 IMPLANT
ELECT REM PT RETURN 9FT ADLT (ELECTROSURGICAL) ×3
ELECTRODE REM PT RTRN 9FT ADLT (ELECTROSURGICAL) ×1 IMPLANT
GAUZE 4X4 16PLY ~~LOC~~+RFID DBL (SPONGE) ×3 IMPLANT
GLOVE SURG ENC MOIS LTX SZ7.5 (GLOVE) ×6 IMPLANT
GLOVE SURG ENC TEXT LTX SZ7.5 (GLOVE) ×6 IMPLANT
GLOVE SURG UNDER LTX SZ8 (GLOVE) ×3 IMPLANT
GLOVE SURG UNDER POLY LF SZ7.5 (GLOVE) ×3 IMPLANT
GOWN STRL REUS W/ TWL LRG LVL3 (GOWN DISPOSABLE) ×2 IMPLANT
GOWN STRL REUS W/ TWL XL LVL3 (GOWN DISPOSABLE) ×1 IMPLANT
GOWN STRL REUS W/TWL LRG LVL3 (GOWN DISPOSABLE) ×6
GOWN STRL REUS W/TWL XL LVL3 (GOWN DISPOSABLE) ×3
HEAD M SROM 36MM PLUS 1.5 (Hips) IMPLANT
HEMOVAC 400CC 10FR (MISCELLANEOUS) ×3 IMPLANT
HOLDER FOLEY CATH W/STRAP (MISCELLANEOUS) ×3 IMPLANT
HOLSTER ELECTROSUGICAL PENCIL (MISCELLANEOUS) ×6 IMPLANT
IV NS IRRIG 3000ML ARTHROMATIC (IV SOLUTION) ×3 IMPLANT
KIT PEG BOARD PINK (KITS) ×3 IMPLANT
KIT TURNOVER KIT A (KITS) ×3 IMPLANT
LINER NEUTRAL 36ID 54OD (Liner) ×2 IMPLANT
MANIFOLD NEPTUNE II (INSTRUMENTS) ×6 IMPLANT
NDL SAFETY ECLIPSE 18X1.5 (NEEDLE) ×1 IMPLANT
NEEDLE HYPO 18GX1.5 SHARP (NEEDLE) ×3
NS IRRIG 500ML POUR BTL (IV SOLUTION) ×1 IMPLANT
OIL CARTRIDGE MAESTRO DRILL (MISCELLANEOUS) ×3
PACK HIP PROSTHESIS (MISCELLANEOUS) ×3 IMPLANT
PENCIL SMOKE EVACUATOR COATED (MISCELLANEOUS) ×3 IMPLANT
PULSAVAC PLUS IRRIG FAN TIP (DISPOSABLE) ×3
SOL PREP PVP 2OZ (MISCELLANEOUS) ×3
SOLUTION PREP PVP 2OZ (MISCELLANEOUS) ×1 IMPLANT
SOLUTION PRONTOSAN WOUND 350ML (IRRIGATION / IRRIGATOR) IMPLANT
SPONGE DRAIN TRACH 4X4 STRL 2S (GAUZE/BANDAGES/DRESSINGS) ×3 IMPLANT
SPONGE T-LAP 18X18 ~~LOC~~+RFID (SPONGE) ×12 IMPLANT
SROM M HEAD 36MM PLUS 1.5 (Hips) ×3 IMPLANT
STAPLER SKIN PROX 35W (STAPLE) ×3 IMPLANT
STEM AML 12.0 STD 6 LRG (Hips) ×2 IMPLANT
SUT ETHIBOND #5 BRAIDED 30INL (SUTURE) ×3 IMPLANT
SUT VIC AB 0 CT1 36 (SUTURE) ×3 IMPLANT
SUT VIC AB 1 CT1 36 (SUTURE) ×6 IMPLANT
SUT VIC AB 2-0 CT1 27 (SUTURE) ×3
SUT VIC AB 2-0 CT1 TAPERPNT 27 (SUTURE) ×1 IMPLANT
SYR 20ML LL LF (SYRINGE) ×3 IMPLANT
TAPE CLOTH 3X10 WHT NS LF (GAUZE/BANDAGES/DRESSINGS) ×3 IMPLANT
TAPE TRANSPORE STRL 2 31045 (GAUZE/BANDAGES/DRESSINGS) ×3 IMPLANT
TIP FAN IRRIG PULSAVAC PLUS (DISPOSABLE) ×1 IMPLANT
TOWEL OR 17X26 4PK STRL BLUE (TOWEL DISPOSABLE) ×5 IMPLANT
TRAY FOLEY MTR SLVR 16FR STAT (SET/KITS/TRAYS/PACK) ×3 IMPLANT
WATER STERILE IRR 1000ML POUR (IV SOLUTION) ×4 IMPLANT
WATER STERILE IRR 500ML POUR (IV SOLUTION) ×3 IMPLANT

## 2021-08-31 NOTE — Transfer of Care (Signed)
Immediate Anesthesia Transfer of Care Note  Patient: Theresa [name]  Procedure(s) Performed: TOTAL HIP ARTHROPLASTY (Left: Hip)  Patient Location: PACU  Anesthesia Type:Spinal  Level of Consciousness: awake, alert  and oriented  Airway & Oxygen Therapy: Patient Spontanous Breathing and Patient connected to nasal cannula oxygen  Post-op Assessment: Report given to RN and Post -op Vital signs reviewed and stable  Post vital signs: Reviewed and stable  Last Vitals:  Vitals Value Taken Time  BP 108/65 08/31/21 1050  Temp    Pulse 54 08/31/21 1052  Resp 17 08/31/21 1052  SpO2 100 % 08/31/21 1052  Vitals shown include unvalidated device data.  Last Pain:  Vitals:   08/31/21 0629  TempSrc: Temporal  PainSc: 5          Complications: No notable events documented.

## 2021-08-31 NOTE — Progress Notes (Signed)
PT at bedside and called RN to room. Sitting pt BP is low.  RN checked and repositioned cuff, bp 77 systolic.  Once laying back in bed increased to 90 systolic.  Informed dr Ernest Pine and will give IVF bolus.  Pt alert and oriented. Was slightly lightheaded while sitting with low BP.

## 2021-08-31 NOTE — H&P (Signed)
The patient has been re-examined, and the chart reviewed, and there have been no interval changes to the documented history and physical.    The risks, benefits, and alternatives have been discussed at length. The patient expressed understanding of the risks benefits and agreed with plans for surgical intervention.  Naila Elizondo P. Myrtle Barnhard, Jr. M.D.    

## 2021-08-31 NOTE — TOC Initial Note (Signed)
Transition of Care [facility]) - Initial/Assessment Note    Patient Details  Name: Theresa [name] MRN: [id number] Date of Birth: 10-20-1955  Transition of Care [facility]) CM/SW Contact:    Conception Oms, RN Phone Number: 08/31/2021, 1:27 PM  Clinical Narrative:         Met with the patient and her husband at the bedside, She has a cane at home, they need a TRW and a 3 in 1, [employer] will deliver to the room prior to DC, She is set up with have [facility] with [facility],    She has transportation and can afford her medication, no additional needs at this time             Patient Goals and CMS Choice        Expected Discharge Plan and Services                                                Prior Living Arrangements/Services                       Activities of Daily Living Home Assistive Devices/Equipment: Eyeglasses ADL Screening (condition at time of admission) Patient's cognitive ability adequate to safely complete daily activities?: Yes Is the patient deaf or have difficulty hearing?: No Does the patient have difficulty seeing, even when wearing glasses/contacts?: No Does the patient have difficulty concentrating, remembering, or making decisions?: No Patient able to express need for assistance with ADLs?: Yes Does the patient have difficulty dressing or bathing?: Yes Independently performs ADLs?: Yes (appropriate for developmental age) Does the patient have difficulty walking or climbing stairs?: No Weakness of Legs: None Weakness of Arms/Hands: None  Permission Sought/Granted                  Emotional Assessment              Admission diagnosis:  Hx of total hip arthroplasty, left [K24.097] Patient Active Problem List   Diagnosis Date Noted   Hx of total hip arthroplasty, left 08/31/2021   Change in bowel habits 08/30/2021   Hemorrhage of anus and rectum 08/30/2021   Pain in upper limb 08/30/2021   Avascular necrosis of left femoral  head ([place]) 06/20/2021   Urticaria 05/21/2021   Pseudoarthrosis of cervical spine ([place]) 02/28/2020   Bilateral arm pain 02/08/2020   Elevated blood-pressure reading, without diagnosis of hypertension 01/22/2020   Numbness and tingling of both upper extremities 01/22/2020   Cystitis 01/21/2020   Bilateral hand pain 06/24/2018   Paresthesia of upper limb 04/28/2018   Stenosis of intervertebral foramina 04/21/2018   Carpal tunnel syndrome 03/24/2018   Aortic regurgitation 02/17/2018   Fall 02/04/2018   Cervicogenic headache 10/26/2017   Pseudoarthrosis of lumbar spine 10/26/2017   Tendinitis of left forearm 10/26/2017   Tendinitis of left rotator cuff 10/26/2017   Status post cervical spinal fusion 10/14/2017   History of lumbar fusion 09/20/2017   Lumbar stenosis with neurogenic claudication 08/31/2017   Degeneration of internal semilunar cartilage of right knee 04/22/2017   Herniated nucleus pulposus, lumbar 10/28/2016   Hip pain 10/22/2016   Lumbago with sciatica 10/22/2016   Degenerative disc disease, lumbar 10/22/2016   Lumbar radiculopathy 10/22/2016   Lumbar spondylosis 10/22/2016   Backache 10/05/2016   Insomnia secondary to anxiety 01/31/2016   S/P TAH (  total abdominal hysterectomy) 01/30/2016   Neck pain 03/04/2014   Brachial radiculitis 09/03/2013   Osteopenia 08/08/2013   Cervico-occipital neuralgia 03/08/2013   Spinal stenosis in cervical region 03/08/2013   Glaucoma (increased eye pressure) 01/07/2013   Thyromegaly 12/16/2012   Cervical spine degeneration 10/23/2012   Degenerative disc disease, cervical 04/13/2012   Special screening for malignant neoplasms, colon 09/18/2011   Breast cancer screening by mammogram 09/18/2011   Hypothyroidism 09/18/2011   Congenital heart defect 02/20/58   PCP:  Crecencio Mc, MD Pharmacy:   [place], [place] - [place] [address] [place] [address] Phone: [phone] Fax:  [phone]     Social Determinants of Health (SDOH) Interventions    Readmission Risk Interventions No flowsheet data found.

## 2021-08-31 NOTE — Anesthesia Preprocedure Evaluation (Addendum)
Anesthesia Evaluation  Patient identified by MRN, date of birth, ID band Patient awake    Reviewed: Allergy & Precautions, NPO status , Patient's Chart, lab work & pertinent test results  History of Anesthesia Complications Negative for: history of anesthetic complications  Airway Mallampati: II  TM Distance: <3 FB Neck ROM: Full   Comment: Good neck extension Dental  (+) Teeth Intact   Pulmonary neg pulmonary ROS, neg sleep apnea, neg COPD, Patient abstained from smoking.Not current smoker,    Pulmonary exam normal breath sounds clear to auscultation       Cardiovascular Exercise Tolerance: Good METS(-) hypertension(-) CAD and (-) Past MI (-) dysrhythmias + Valvular Problems/Murmurs AI  Rhythm:Regular Rate:Normal + Diastolic murmurs- Systolic murmurs Deemed low risk by cardiologist re: aortic regurg  TTE 2022:  1. Left ventricular ejection fraction, by estimation, is 65 to 70%. Left  ventricular ejection fraction by 3D volume is 70 %. The left ventricle has  normal function. The left ventricle has no regional wall motion  abnormalities. Left ventricular diastolic  parameters are consistent with Grade I diastolic dysfunction (impaired  relaxation).  2. Right ventricular systolic function is normal. The right ventricular  size is normal. There is normal pulmonary artery systolic pressure. The  estimated right ventricular systolic pressure is 28.2 mmHg.  3. The mitral valve is normal in structure. Mild mitral valve  regurgitation. No evidence of mitral stenosis.  4. The aortic valve is abnormal.   Quadracuspid aortic valve. 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 No aortic stenosis is present. Aortic regurgitation PHT  measures 487 msec.  5. The inferior vena cava is normal in size with greater than 50%  respiratory variability,  suggesting right atrial pressure of 3 mmHg.  6. Compared to study 05/08/2020 there is no change.    Neuro/Psych  Headaches, PSYCHIATRIC DISORDERS Anxiety S/p cervical and lumbar (L3-L4) fusions  Neuromuscular disease    GI/Hepatic neg GERD  ,(+)     (-) substance abuse  ,   Endo/Other  neg diabetesHypothyroidism   Renal/GU negative Renal ROS     Musculoskeletal  (+) Arthritis ,   Abdominal   Peds  Hematology   Anesthesia Other Findings Past Medical History: No date: Breast screening, unspecified No date: H/O: Bell's palsy No date: Headache No date: Heart murmur No date: History of kidney stones No date: Hypercalcemia No date: Osteoporosis No date: Screening for malignant neoplasm of the cervix No date: Special screening for malignant neoplasms, colon No date: Unspecified hypothyroidism     Comment:  thyroid nodule No date: Unspecified otitis media  Reproductive/Obstetrics                            Anesthesia Physical Anesthesia Plan  ASA: 2  Anesthesia Plan: Spinal   Post-op Pain Management: Celebrex PO (pre-op) and Ofirmev IV (intra-op)   Induction: Intravenous  PONV Risk Score and Plan: 2 and Ondansetron, Dexamethasone, Propofol infusion, TIVA and Midazolam  Airway Management Planned: Natural Airway  Additional Equipment: None  Intra-op Plan:   Post-operative Plan:   Informed Consent: I have reviewed the patients History and Physical, chart, labs and discussed the procedure including the risks, benefits and alternatives for the proposed anesthesia with the patient or authorized representative who has indicated his/her understanding and acceptance.       Plan Discussed with: CRNA and Surgeon  Anesthesia  Plan Comments: (Discussed R/B/A of neuraxial anesthesia technique with patient: - rare risks of spinal/epidural hematoma, nerve damage, infection - Risk of PDPH - Risk of nausea and vomiting - Risk of conversion to  general anesthesia (potentially higher given history of lumbar hardware) and its associated risks, including sore throat, damage to lips/teeth/oropharynx/eyes, and rare risks such as cardiac and respiratory events. - Risk of allergic reactions  Discussed the role of CRNA in patient's perioperative care.  Patient voiced understanding.)        Anesthesia Quick Evaluation

## 2021-08-31 NOTE — Anesthesia Procedure Notes (Signed)
Date/Time: 08/31/2021 7:43 AM Performed by: Junious Silk, CRNA Oxygen Delivery Method: Nasal cannula

## 2021-08-31 NOTE — Op Note (Signed)
OPERATIVE NOTE  DATE OF SURGERY:  08/31/2021  PATIENT NAME:  Theresa [name]   DOB: Jun 23, 1956  MRN: [id number]  PRE-OPERATIVE DIAGNOSIS: Degenerative arthrosis of the left hip, primary  POST-OPERATIVE DIAGNOSIS:  Same  PROCEDURE:  Left total hip arthroplasty  SURGEON:  Jena Gauss. M.D.  ASSISTANT: Baldwin Jamaica, PA-C (present and scrubbed throughout the case, critical for assistance with exposure, retraction, instrumentation, and closure)  ANESTHESIA: spinal  ESTIMATED BLOOD LOSS: 50 mL  FLUIDS REPLACED: 1300 mL of crystalloid  DRAINS: 2 medium Hemovac drains  IMPLANTS UTILIZED: DePuy 12 mm large stature AML femoral stem, 54 mm OD Pinnacle 100 acetabular component, neutral Pinnacle Altrx polyethylene insert, and a 36 mm M-SPEC +1.5 mm hip ball  INDICATIONS FOR SURGERY: Theresa [name] is a 65 y.o. year old female with a long history of progressive hip and groin  pain. X-rays demonstrated severe degenerative changes. The patient had not seen any significant improvement despite conservative nonsurgical intervention. After discussion of the risks and benefits of surgical intervention, the patient expressed understanding of the risks benefits and agree with plans for total hip arthroplasty.   The risks, benefits, and alternatives were discussed at length including but not limited to the risks of infection, bleeding, nerve injury, stiffness, blood clots, the need for revision surgery, limb length inequality, dislocation, cardiopulmonary complications, among others, and they were willing to proceed.  PROCEDURE IN DETAIL: The patient was brought into the operating room and, after adequate spinal anesthesia was achieved, the patient was placed in a right lateral decubitus position. Axillary roll was placed and all bony prominences were well-padded. The patient's left hip was cleaned and prepped with alcohol and DuraPrep and draped in the usual sterile fashion. A "timeout" was  performed as per usual protocol. A lateral curvilinear incision was made gently curving towards the posterior superior iliac spine. The IT band was incised in line with the skin incision and the fibers of the gluteus maximus were split in line. The piriformis tendon was identified, skeletonized, and incised at its insertion to the proximal femur and reflected posteriorly. A T type posterior capsulotomy was performed. Prior to dislocation of the femoral head, a threaded Steinmann pin was inserted through a separate stab incision into the pelvis superior to the acetabulum and bent in the form of a stylus so as to assess limb length and hip offset throughout the procedure. The femoral head was then dislocated posteriorly. Inspection of the femoral head demonstrated severe degenerative changes with full-thickness loss of articular cartilage. The femoral neck cut was performed using an oscillating saw. The anterior capsule was elevated off of the femoral neck using a periosteal elevator. Attention was then directed to the acetabulum. The remnant of the labrum was excised using electrocautery. Inspection of the acetabulum also demonstrated significant degenerative changes. The acetabulum was reamed in sequential fashion up to a 53 mm diameter. Good punctate bleeding bone was encountered. A 54 mm Pinnacle 100 acetabular component was positioned and impacted into place. Good scratch fit was appreciated. A neutral polyethylene trial was inserted.  Attention was then directed to the proximal femur. A hole for reaming of the proximal femoral canal was created using a high-speed burr. The femoral canal was reamed in sequential fashion up to a 11.5 mm diameter. This allowed for approximately 6 cm of scratch fit. Serial broaches were inserted up to a 12 mm large stature femoral broach. Calcar region was planed and a trial reduction was performed using a 36 mm  hip ball with a +1.5 mm neck length. Good equalization of limb  lengths and hip offset was appreciated and excellent stability was noted both anteriorly and posteriorly. Trial components were removed. The acetabular shell was irrigated with copious amounts of normal saline with antibiotic solution and suctioned dry. A neutral Pinnacle Altrx polyethylene insert was positioned and impacted into place. Next, a 12 mm large stature AML femoral stem was positioned and impacted into place. Excellent scratch fit was appreciated. A trial reduction was again performed with a 36 mm hip ball with a +1.5 mm neck length. Again, good equalization of limb lengths was appreciated and excellent stability appreciated both anteriorly and posteriorly. The hip was then dislocated and the trial hip ball was removed. The Morse taper was cleaned and dried. A 36 mm M-SPEC hip ball with a +1.5 mm neck length was placed on the trunnion and impacted into place. The hip was then reduced and placed through range of motion. Excellent stability was appreciated both anteriorly and posteriorly.  The wound was irrigated with copious amounts of normal saline followed by 350 ml of Prontosan and suctioned dry. Good hemostasis was appreciated. The posterior capsulotomy was repaired using #5 Ethibond. Piriformis tendon was reapproximated to the undersurface of the gluteus medius tendon using #5 Ethibond. The IT band was reapproximated using interrupted sutures of #1 Vicryl. Subcutaneous tissue was approximated using first #0 Vicryl followed by #2-0 Vicryl. The skin was closed with skin staples.  The patient tolerated the procedure well and was transported to the recovery room in stable condition.   Jena Gauss., M.D.

## 2021-08-31 NOTE — Anesthesia Procedure Notes (Signed)
Spinal  Patient location during procedure: OR Start time: 08/31/2021 7:18 AM End time: 08/31/2021 7:24 AM Reason for block: surgical anesthesia Staffing Performed: resident/CRNA  Resident/CRNA: Nelda Marseille, CRNA Preanesthetic Checklist Completed: patient identified, IV checked, site marked, risks and benefits discussed, surgical consent, monitors and equipment checked, pre-op evaluation and timeout performed Spinal Block Patient position: sitting Prep: ChloraPrep Patient monitoring: heart rate, continuous pulse ox, blood pressure and cardiac monitor Approach: midline Location: L3-4 Injection technique: single-shot Needle Needle type: Introducer and Quincke  Needle gauge: 22 G Needle length: 9 cm Assessment Sensory level: T10 Events: CSF return Additional Notes Negative paresthesia. Negative blood return. Positive free-flowing CSF. Expiration date of kit checked and confirmed. Patient tolerated procedure well, without complications.

## 2021-08-31 NOTE — Evaluation (Signed)
Physical Therapy Evaluation Patient Details Name: Theresa [name] MRN: [id number] DOB: 03-12-1956 Today's Date: 08/31/2021  History of Present Illness  Pt is a 65 y/o F with hx of avascular necrosis of L femoral head & underwent scheduled L THA on 08/31/21 by Dr. Ernest Pine. Pt is WBAT LLE with posterior hip precautions. PMH: DDD, migraines  Clinical Impression  Pt seen for PT evaluation with PT educating pt on posterior hip precautions & weight bearing status. Provided pt with HEP & pt performs LLE strengthening exercises without physical assistance. Pt is able to transfer supine>sit with supervision, sit>supine with min assist for LLE. Deferred standing 2/2 low BP (pt only c/o slight dizziness sitting EOB) but anticipate pt will mobilize well tomorrow.       Recommendations for follow up therapy are one component of a multi-disciplinary discharge planning process, led by the attending physician.  Recommendations may be updated based on patient status, additional functional criteria and insurance authorization.  Follow Up Recommendations Home health PT    Assistance Recommended at Discharge Intermittent Supervision/Assistance  Functional Status Assessment Patient has had a recent decline in their functional status and demonstrates the ability to make significant improvements in function in a reasonable and predictable amount of time.  Equipment Recommendations  Rolling walker (2 wheels);BSC/3in1    Recommendations for Other Services       Precautions / Restrictions Precautions Precautions: Fall;Posterior Hip Precaution Booklet Issued: Yes (comment) Restrictions Weight Bearing Restrictions: Yes LLE Weight Bearing: Weight bearing as tolerated      Mobility  Bed Mobility Overal bed mobility: Needs Assistance Bed Mobility: Supine to Sit;Sit to Supine     Supine to sit: Supervision;HOB elevated Sit to supine: Min assist;HOB elevated   General bed mobility comments: use of bed  rails, cuing to maintain precautions, assistance to elevate LLE onto bed    Transfers Overall transfer level:  (deferred 2/2 low BP)                      Ambulation/Gait                  Stairs            Wheelchair Mobility    Modified Rankin (Stroke Patients Only)       Balance Overall balance assessment: Needs assistance Sitting-balance support: Feet supported Sitting balance-Leahy Scale: Good                                       Pertinent Vitals/Pain Pain Assessment: 0-10 Pain Score: 10-Worst pain ever Pain Location: L hip at end of session 10/10 Pain Descriptors / Indicators: Grimacing;Discomfort Pain Intervention(s): Monitored during session;Limited activity within patient's tolerance;Patient requesting pain meds-RN notified;Ice applied    Home Living Family/patient expects to be discharged to:: Private residence Living Arrangements: Spouse/significant other Available Help at Discharge: Family;Available 24 hours/day Type of Home: House Home Access: Stairs to enter Entrance Stairs-Rails: Doctor, general practice of Steps: 2 (+1 small threshold step without rail)   Home Layout: One level Home Equipment: None      Prior Function Prior Level of Function : Independent/Modified Independent                     Hand Dominance        Extremity/Trunk Assessment   Upper Extremity Assessment Upper Extremity Assessment: Overall WFL for tasks assessed  Lower Extremity Assessment Lower Extremity Assessment: LLE deficits/detail (proprioception & sensation intact to light touch) LLE Deficits / Details: 3-/5 knee extension       Communication   Communication: No difficulties  Cognition Arousal/Alertness: Awake/alert Behavior During Therapy: WFL for tasks assessed/performed Overall Cognitive Status: Within Functional Limits for tasks assessed                                           General Comments General comments (skin integrity, edema, etc.): BP sitting EOB: 82/56 mmHg (MAP 64), rechecked sitting EOB 77/48 mmHg (MAP 58), increased once supine    Exercises General Exercises - Lower Extremity Ankle Circles/Pumps: AROM;Left;10 reps;Supine Quad Sets: AROM;Strengthening;Left;10 reps;Supine Gluteal Sets: AROM;Strengthening;10 reps;Supine Short Arc Quad: AROM;Strengthening;Left;10 reps;Supine Long Arc Quad: AROM;Strengthening;Left;10 reps;Seated Heel Slides: AROM;Strengthening;Left;10 reps;Seated Hip ABduction/ADduction: AROM;Strengthening;Left;10 reps;Supine (hip abduction slides) Straight Leg Raises: AROM;Strengthening;Left;10 reps;Supine   Assessment/Plan    PT Assessment Patient needs continued PT services  PT Problem List Decreased strength;Decreased mobility;Decreased knowledge of precautions;Decreased activity tolerance;Decreased balance;Decreased knowledge of use of DME;Pain;Cardiopulmonary status limiting activity       PT Treatment Interventions DME instruction;Therapeutic exercise;Balance training;Gait training;Stair training;Neuromuscular re-education;Modalities;Functional mobility training;Therapeutic activities;Patient/family education    PT Goals (Current goals can be found in the Care Plan section)  Acute Rehab PT Goals Patient Stated Goal: return to PLOF PT Goal Formulation: With patient/family Time For Goal Achievement: 09/14/21 Potential to Achieve Goals: Good    Frequency BID   Barriers to discharge        Co-evaluation               AM-PAC PT "6 Clicks" Mobility  Outcome Measure Help needed turning from your back to your side while in a flat bed without using bedrails?: None Help needed moving from lying on your back to sitting on the side of a flat bed without using bedrails?: A Little Help needed moving to and from a bed to a chair (including a wheelchair)?: A Little Help needed standing up from a chair using your arms (e.g.,  wheelchair or bedside chair)?: A Little Help needed to walk in hospital room?: A Little Help needed climbing 3-5 steps with a railing? : A Lot 6 Click Score: 18    End of Session   Activity Tolerance: Treatment limited secondary to medical complications (Comment) (limited by low BP) Patient left: in bed;with call bell/phone within reach;with family/visitor present Nurse Communication: Mobility status (BP) PT Visit Diagnosis: Muscle weakness (generalized) (M62.81);Unsteadiness on feet (R26.81);Pain Pain - Right/Left: Left Pain - part of body: Hip    Time: 8592-9244 PT Time Calculation (min) (ACUTE ONLY): 31 min   Charges:   PT Evaluation $PT Eval Low Complexity: 1 Low PT Treatments $Therapeutic Activity: 23-37 mins        Aleda Grana, PT, DPT 08/31/21, 4:03 PM   Sandi Mariscal 08/31/2021, 4:01 PM

## 2021-09-01 DIAGNOSIS — M1612 Unilateral primary osteoarthritis, left hip: Secondary | ICD-10-CM | POA: Diagnosis not present

## 2021-09-01 MED ORDER — CELECOXIB 200 MG PO CAPS: 200.0000 mg | ORAL_CAPSULE | Freq: Two times a day (BID) | ORAL | 0 refills | Status: DC

## 2021-09-01 MED ORDER — OXYCODONE HCL 5 MG PO TABS
ORAL_TABLET | ORAL | Status: AC
  Filled 2021-09-01: qty 1

## 2021-09-01 MED ORDER — METOCLOPRAMIDE HCL 10 MG PO TABS
ORAL_TABLET | ORAL | Status: AC
  Filled 2021-09-01: qty 1

## 2021-09-01 MED ORDER — OXYCODONE HCL 5 MG PO TABS
ORAL_TABLET | ORAL | Status: AC
  Administered 2021-09-01: 08:00:00 5 mg via ORAL
  Filled 2021-09-01: qty 1

## 2021-09-01 MED ORDER — CELECOXIB 200 MG PO CAPS
ORAL_CAPSULE | ORAL | Status: AC
  Administered 2021-09-01: 10:00:00 200 mg via ORAL
  Filled 2021-09-01: qty 1

## 2021-09-01 MED ORDER — GABAPENTIN 100 MG PO CAPS
ORAL_CAPSULE | ORAL | Status: AC
  Administered 2021-09-01: 10:00:00 200 mg via ORAL
  Filled 2021-09-01: qty 2

## 2021-09-01 MED ORDER — ENOXAPARIN SODIUM 40 MG/0.4ML IJ SOSY: 40.0000 mg | PREFILLED_SYRINGE | INTRAMUSCULAR | 0 refills | Status: DC

## 2021-09-01 MED ORDER — ACETAMINOPHEN 10 MG/ML IV SOLN
INTRAVENOUS | Status: AC
  Filled 2021-09-01: qty 100

## 2021-09-01 MED ORDER — SODIUM CHLORIDE 0.9 % IV SOLN: INTRAVENOUS | Status: DC

## 2021-09-01 MED ORDER — OXYCODONE HCL 5 MG PO TABS: 5.0000 mg | ORAL_TABLET | ORAL | 0 refills | Status: DC | PRN

## 2021-09-01 MED ORDER — MAGNESIUM HYDROXIDE 400 MG/5ML PO SUSP
ORAL | Status: AC
  Administered 2021-09-01: 10:00:00 30 mL via ORAL
  Filled 2021-09-01: qty 30

## 2021-09-01 NOTE — Progress Notes (Signed)
Physical Therapy Treatment Patient Details Name: Theresa [name] MRN: [id number] DOB: 10-04-1955 Today's Date: 09/01/2021   History of Present Illness Pt is a 65 y/o F with hx of avascular necrosis of L femoral head & underwent scheduled L THA on 08/31/21 by Dr. Ernest Pine. Pt is WBAT LLE with posterior hip precautions. PMH: DDD, migraines    PT Comments    OOB and back in with min a x 1 for LE.  She is able to stand and walk 200' with RW and min guard/supervision.  No dizziness.  Feels comfortable with stair training earlier.  Attentive husband in and assists as needed during session.  Questions answered for discharge.   Pt stated she is feeling comfortable with mobility.  No further PT barriers/concerns for discharge.     Recommendations for follow up therapy are one component of a multi-disciplinary discharge planning process, led by the attending physician.  Recommendations may be updated based on patient status, additional functional criteria and insurance authorization.  Follow Up Recommendations  Home health PT     Assistance Recommended at Discharge Intermittent Supervision/Assistance  Equipment Recommendations  Rolling walker (2 wheels);BSC/3in1    Recommendations for Other Services       Precautions / Restrictions Precautions Precautions: Fall;Posterior Hip Precaution Booklet Issued: Yes (comment) Restrictions Weight Bearing Restrictions: Yes LLE Weight Bearing: Weight bearing as tolerated     Mobility  Bed Mobility Overal bed mobility: Needs Assistance Bed Mobility: Supine to Sit;Sit to Supine     Supine to sit: Min assist Sit to supine: Min assist   General bed mobility comments: in chair before and after    Transfers Overall transfer level: Needs assistance Equipment used: Rolling walker (2 wheels) Transfers: Sit to/from Stand Sit to Stand: Supervision                Ambulation/Gait Ambulation/Gait assistance: Min guard;Supervision Gait  Distance (Feet): 200 Feet Assistive device: Rolling walker (2 wheels) Gait Pattern/deviations: Step-through pattern Gait velocity: decreased     General Gait Details: steady with no dizziness   Stairs Stairs: Yes Stairs assistance: Min guard Stair Management: Two rails Number of Stairs: 4     Wheelchair Mobility    Modified Rankin (Stroke Patients Only)       Balance Overall balance assessment: Needs assistance Sitting-balance support: Feet supported Sitting balance-Leahy Scale: Good     Standing balance support: Bilateral upper extremity supported Standing balance-Leahy Scale: Good                              Cognition Arousal/Alertness: Awake/alert Behavior During Therapy: WFL for tasks assessed/performed Overall Cognitive Status: Within Functional Limits for tasks assessed                                          Exercises Other Exercises Other Exercises: to commode to void    General Comments        Pertinent Vitals/Pain Pain Assessment: Faces Faces Pain Scale: Hurts a little bit Pain Location: L hip end of session Pain Descriptors / Indicators: Grimacing;Discomfort Pain Intervention(s): Limited activity within patient's tolerance;Monitored during session;Repositioned;Ice applied    Home Living                          Prior Function  PT Goals (current goals can now be found in the care plan section) Progress towards PT goals: Progressing toward goals    Frequency    BID      PT Plan Current plan remains appropriate    Co-evaluation              AM-PAC PT "6 Clicks" Mobility   Outcome Measure  Help needed turning from your back to your side while in a flat bed without using bedrails?: None Help needed moving from lying on your back to sitting on the side of a flat bed without using bedrails?: A Little Help needed moving to and from a bed to a chair (including a wheelchair)?:  A Little Help needed standing up from a chair using your arms (e.g., wheelchair or bedside chair)?: None Help needed to walk in hospital room?: A Little Help needed climbing 3-5 steps with a railing? : A Little 6 Click Score: 20    End of Session Equipment Utilized During Treatment: Gait belt Activity Tolerance: Patient tolerated treatment well Patient left: in bed;with call bell/phone within reach;with family/visitor present Nurse Communication: Mobility status PT Visit Diagnosis: Muscle weakness (generalized) (M62.81);Unsteadiness on feet (R26.81);Pain Pain - Right/Left: Left Pain - part of body: Hip     Time: 0239-0254 PT Time Calculation (min) (ACUTE ONLY): 15 min  Charges:  $Gait Training: 8-22 mins                    Danielle Dess, PTA 09/01/21, 3:01 PM

## 2021-09-01 NOTE — Evaluation (Signed)
Occupational Therapy Evaluation Patient Details Name: Theresa [name] MRN: [id number] DOB: 10/17/1955 Today's Date: 09/01/2021   History of Present Illness Pt is a 65 y/o F with hx of avascular necrosis of L femoral head & underwent scheduled L THA on 08/31/21 by Dr. Ernest Pine. Pt is WBAT LLE with posterior hip precautions. PMH: DDD, migraines   Clinical Impression   Theresa [name] was seen for OT evaluation this date, POD#1 from above surgery. Pt was independent in all ADLs prior to surgery. Pt lives in 1 level home c 2-3 STE and family available 24/7. Pt requires MAX A don/doff B socks at bed level. SBA + RW for toilet t/f with elevated seat and hand washing standing sinkside. BP initial standing 117/54, no dizziness noted. Pt able to recall 2/3 posterior total hip precautions at start of session improving to 3/3 at end of session. Pt instructed in posterior total hip precautions and how to implement, self care skills, falls prevention strategies, home/routines modifications, DME/AE for LB bathing and dressing tasks, compression stocking mgt strategies, and pet management techniques. All education complete and no skilled acute OT needs identified, will sign off. Recommend no OT follow up upon discharge.         Recommendations for follow up therapy are one component of a multi-disciplinary discharge planning process, led by the attending physician.  Recommendations may be updated based on patient status, additional functional criteria and insurance authorization.   Follow Up Recommendations  No OT follow up    Assistance Recommended at Discharge Intermittent Supervision/Assistance  Functional Status Assessment  Patient has had a recent decline in their functional status and demonstrates the ability to make significant improvements in function in a reasonable and predictable amount of time.  Equipment Recommendations  BSC/3in1    Recommendations for Other Services       Precautions /  Restrictions Precautions Precautions: Fall;Posterior Hip Precaution Booklet Issued: Yes (comment) Restrictions Weight Bearing Restrictions: Yes LLE Weight Bearing: Weight bearing as tolerated      Mobility Bed Mobility Overal bed mobility: Needs Assistance Bed Mobility: Supine to Sit     Supine to sit: Min assist     General bed mobility comments: assist for trunk from flat bed.    Transfers Overall transfer level: Needs assistance Equipment used: Rolling walker (2 wheels) Transfers: Sit to/from Stand Sit to Stand: Supervision           General transfer comment: cues for technique      Balance Overall balance assessment: Needs assistance Sitting-balance support: Feet supported Sitting balance-Leahy Scale: Good     Standing balance support: No upper extremity supported;During functional activity Standing balance-Leahy Scale: Good                             ADL either performed or assessed with clinical judgement   ADL Overall ADL's : Needs assistance/impaired                                       General ADL Comments: MAX A don/doff B socks at bed level. SBA + RW for toilet t/f with elevated seat and hand washing standing sinkside.      Pertinent Vitals/Pain Pain Assessment: 0-10 Pain Score: 7  Pain Location: L hip end of session Pain Descriptors / Indicators: Grimacing;Discomfort Pain Intervention(s): Limited activity within patient's tolerance;Premedicated before session;Repositioned;RN gave  pain meds during session     Hand Dominance Right   Extremity/Trunk Assessment Upper Extremity Assessment Upper Extremity Assessment: Overall WFL for tasks assessed   Lower Extremity Assessment Lower Extremity Assessment: Generalized weakness       Communication Communication Communication: No difficulties   Cognition Arousal/Alertness: Awake/alert Behavior During Therapy: WFL for tasks assessed/performed Overall Cognitive  Status: Within Functional Limits for tasks assessed                                       General Comments       Exercises Exercises: Other exercises Other Exercises Other Exercises: Pt and family educated re: OT role, DME recs, d/c recs, falls prevention, compression stocking mgmt, functional application of hip pcns Other Exercises: LBD, toileting, sup>sit, sit<>stand x2, sitting/standing balance/tolerance   Shoulder Instructions      Home Living Family/patient expects to be discharged to:: Private residence Living Arrangements: Spouse/significant other Available Help at Discharge: Family;Available 24 hours/day Type of Home: House Home Access: Stairs to enter [employer] of Steps: 2 (+1 small threshold step without rail) Entrance Stairs-Rails: Right;Left Home Layout: One level     Bathroom Shower/Tub: Chief Strategy Officer: Standard     Home Equipment: None          Prior Functioning/Environment Prior Level of Function : Independent/Modified Independent               ADLs Comments: husband has been assisting with socks last ~2 weeks        OT Problem List: Decreased strength;Decreased range of motion;Decreased activity tolerance;Impaired balance (sitting and/or standing);Decreased safety awareness         OT Goals(Current goals can be found in the care plan section) Acute Rehab OT Goals Patient Stated Goal: to go home OT Goal Formulation: With patient/family Time For Goal Achievement: 09/15/21 Potential to Achieve Goals: Good   AM-PAC OT "6 Clicks" Daily Activity     Outcome Measure Help from another person eating meals?: None Help from another person taking care of personal grooming?: A Little Help from another person toileting, which includes using toliet, bedpan, or urinal?: A Little Help from another person bathing (including washing, rinsing, drying)?: A Little Help from another person to put on and taking  off regular upper body clothing?: None Help from another person to put on and taking off regular lower body clothing?: A Lot 6 Click Score: 19   End of Session Equipment Utilized During Treatment: Rolling walker (2 wheels) Nurse Communication: Mobility status  Activity Tolerance: Patient tolerated treatment well Patient left: in chair;with call bell/phone within reach;with family/visitor present  OT Visit Diagnosis: Other abnormalities of gait and mobility (R26.89)                Time: 1610-9604 OT Time Calculation (min): 34 min Charges:  OT General Charges $OT Visit: 1 Visit OT Evaluation $OT Eval Low Complexity: 1 Low OT Treatments $Self Care/Home Management : 23-37 mins  Kathie Dike, M.S. OTR/L  09/01/21, 10:31 AM  ascom [phone]

## 2021-09-01 NOTE — Progress Notes (Addendum)
Pt complained of nausea, IV fluids restarted, peppermint aroma therapy given with cotton ball. Pt continues to sit up in chair. Pt refused nausea medication. Diet ginger ale given at patient's request.

## 2021-09-01 NOTE — Anesthesia Postprocedure Evaluation (Signed)
Anesthesia Post Note  Patient: Theresa [name]  Procedure(s) Performed: TOTAL HIP ARTHROPLASTY (Left: Hip)  Patient location during evaluation: Nursing Unit Anesthesia Type: Spinal Level of consciousness: awake Vital Signs Assessment: post-procedure vital signs reviewed and stable Respiratory status: spontaneous breathing Cardiovascular status: stable Postop Assessment: no headache Anesthetic complications: no   No notable events documented.   Last Vitals:  Vitals:   09/01/21 0400 09/01/21 0715  BP: 100/60 (!) 106/58  Pulse: 62 65  Resp: 16 14  Temp: (!) 36.3 C 36.7 C  SpO2: 94% 94%    Last Pain:  Vitals:   09/01/21 0400  TempSrc: Temporal  PainSc:                  Jaye Beagle

## 2021-09-01 NOTE — Progress Notes (Signed)
°  Subjective: 1 Day Post-Op Procedure(s) (LRB): TOTAL HIP ARTHROPLASTY (Left) Patient reports pain as well-controlled.  Husband present at bedside.  Patient is  well but did have difficulty voiding overnight. Plan is to go Home after hospital stay. Negative for chest pain and shortness of breath Fever: no Gastrointestinal: negative for nausea and vomiting.  Patient has not had a bowel movement.  Objective: Vital signs in last 24 hours: Temp:  [96.1 F (35.6 C)-98.5 F (36.9 C)] 98 F (36.7 C) (12/20 0715) Pulse Rate:  [58-80] 65 (12/20 0715) Resp:  [12-19] 14 (12/20 0715) BP: (77-115)/(48-88) 106/58 (12/20 0715) SpO2:  [89 %-100 %] 94 % (12/20 0715)  Intake/Output from previous day:  Intake/Output Summary (Last 24 hours) at 09/01/2021 0919 Last data filed at 09/01/2021 0435 Gross per 24 hour  Intake 2114.12 ml  Output 1890 ml  Net 224.12 ml    Intake/Output this shift: No intake/output data recorded.  Labs: No results for input(s): HGB in the last 72 hours. No results for input(s): WBC, RBC, HCT, PLT in the last 72 hours. No results for input(s): NA, K, CL, CO2, BUN, CREATININE, GLUCOSE, CALCIUM in the last 72 hours. No results for input(s): LABPT, INR in the last 72 hours.   EXAM General - Patient is Alert, Appropriate, and Oriented Extremity - Neurovascular intact Dorsiflexion/Plantar flexion intact Compartment soft Dressing/Incision -minimal dried blood over honeycomb, Hemovac in place  Motor Function - intact, moving foot and toes well on exam.  Cardiovascular- Regular rate and rhythm, no murmurs/rubs/gallops Respiratory- Lungs clear to auscultation bilaterally Gastrointestinal- soft, nontender, and active bowel sounds   Assessment/Plan: 1 Day Post-Op Procedure(s) (LRB): TOTAL HIP ARTHROPLASTY (Left) Principal Problem:   Hx of total hip arthroplasty, left  Estimated body mass index is 24.79 kg/m as calculated from the following:   Height as of this  encounter: 5\' 5"  (1.651 m).   Weight as of this encounter: 67.6 kg. Advance diet Up with therapy       DVT Prophylaxis - Lovenox, Ted hose, and foot pumps Weight-Bearing as tolerated to left leg  , PA-C [facility] 09/01/2021, 9:19 AM

## 2021-09-01 NOTE — Progress Notes (Signed)
Physical Therapy Treatment Patient Details Name: Theresa [name] MRN: [id number] DOB: 26-Apr-1956 Today's Date: 09/01/2021   History of Present Illness Pt is a 65 y/o F with hx of avascular necrosis of L femoral head & underwent scheduled L THA on 08/31/21 by Dr. Ernest Pine. Pt is WBAT LLE with posterior hip precautions. PMH: DDD, migraines    PT Comments    Attempted session this am.  BP 88/53 sitting with c/o dizziness.  92/48 with LE's elevated.  RN in.  Messaged Dr. Ernest Pine and plan to return later this am.   Returned prior to lunch and pt feeling better.  She is able to stand and walk 160' with RW and min guard.  She is able to complete stair training with ease.  Husband in for session and education provided.  Stressed slow transitions and awareness of dizziness/safety.  Voiced understanding.   Will return again later this PM for further interventions,.   Recommendations for follow up therapy are one component of a multi-disciplinary discharge planning process, led by the attending physician.  Recommendations may be updated based on patient status, additional functional criteria and insurance authorization.  Follow Up Recommendations  Home health PT     Assistance Recommended at Discharge Intermittent Supervision/Assistance  Equipment Recommendations  Rolling walker (2 wheels);BSC/3in1    Recommendations for Other Services       Precautions / Restrictions Precautions Precautions: Fall;Posterior Hip Precaution Booklet Issued: Yes (comment) Restrictions Weight Bearing Restrictions: Yes LLE Weight Bearing: Weight bearing as tolerated     Mobility  Bed Mobility Overal bed mobility: Needs Assistance Bed Mobility: Supine to Sit     Supine to sit: Min assist     General bed mobility comments: in chair before and after    Transfers Overall transfer level: Needs assistance Equipment used: Rolling walker (2 wheels) Transfers: Sit to/from Stand Sit to Stand:  Supervision           General transfer comment: cues for technique    Ambulation/Gait Ambulation/Gait assistance: Min guard;Supervision Gait Distance (Feet): 160 Feet Assistive device: Rolling walker (2 wheels) Gait Pattern/deviations: Step-through pattern Gait velocity: decreased         Stairs Stairs: Yes Stairs assistance: Min guard Stair Management: Two rails Number of Stairs: 4     Wheelchair Mobility    Modified Rankin (Stroke Patients Only)       Balance Overall balance assessment: Needs assistance Sitting-balance support: Feet supported Sitting balance-Leahy Scale: Good     Standing balance support: No upper extremity supported;During functional activity Standing balance-Leahy Scale: Good                              Cognition Arousal/Alertness: Awake/alert Behavior During Therapy: WFL for tasks assessed/performed Overall Cognitive Status: Within Functional Limits for tasks assessed                                          Exercises Other Exercises Other Exercises: Pt and family educated re: OT role, DME recs, d/c recs, falls prevention, compression stocking mgmt, functional application of hip pcns Other Exercises: LBD, toileting, sup>sit, sit<>stand x2, sitting/standing balance/tolerance    General Comments        Pertinent Vitals/Pain Pain Assessment: Faces Pain Score: 7  Faces Pain Scale: Hurts a little bit Pain Location: L hip end of session Pain Descriptors /  Indicators: Grimacing;Discomfort Pain Intervention(s): Limited activity within patient's tolerance;Monitored during session;Premedicated before session    Home Living Family/patient expects to be discharged to:: Private residence Living Arrangements: Spouse/significant other Available Help at Discharge: Family;Available 24 hours/day Type of Home: House Home Access: Stairs to enter Entrance Stairs-Rails: Doctor, general practice of  Steps: 2 (+1 small threshold step without rail)   Home Layout: One level Home Equipment: None      Prior Function            PT Goals (current goals can now be found in the care plan section) Progress towards PT goals: Progressing toward goals    Frequency    BID      PT Plan      Co-evaluation              AM-PAC PT "6 Clicks" Mobility   Outcome Measure  Help needed turning from your back to your side while in a flat bed without using bedrails?: None Help needed moving from lying on your back to sitting on the side of a flat bed without using bedrails?: A Little Help needed moving to and from a bed to a chair (including a wheelchair)?: A Little Help needed standing up from a chair using your arms (e.g., wheelchair or bedside chair)?: A Little Help needed to walk in hospital room?: A Little Help needed climbing 3-5 steps with a railing? : A Little 6 Click Score: 19    End of Session   Activity Tolerance: Treatment limited secondary to medical complications (Comment) (limited by low BP) Patient left: in bed;with call bell/phone within reach;with family/visitor present Nurse Communication: Mobility status (BP) PT Visit Diagnosis: Muscle weakness (generalized) (M62.81);Unsteadiness on feet (R26.81);Pain Pain - Right/Left: Left Pain - part of body: Hip     Time: 9562-1308 PT Time Calculation (min) (ACUTE ONLY): 23 min  Charges:  $Gait Training: 23-37 mins                    Danielle Dess, PTA 09/01/21, 1:06 PM

## 2021-09-01 NOTE — Progress Notes (Signed)
Patient thought she had to urinate. Did not want to use the bedpan because of how messy it was during day shift. Applied Purewick temporarily. Stepped out and gave patient time. Checked on patient she was sleeping.   Patient had not urinated and stated that she thought she had urinated. Purewick was intact and hooked to suction. Patient's bed is dry. Bladder scanned patient showed . In and out cath performed by Tobi Bastos, NT and Waldo Laine, RN. Got of pale yellow urine.

## 2021-09-01 NOTE — Progress Notes (Signed)
Post-op dressing removed. , Hemovac removed., Mini compression dressing applied. , and Fresh honeycomb dressing applied.   

## 2021-09-01 NOTE — Discharge Summary (Signed)
Physician Discharge Summary  Patient ID: Theresa Theresa [name] MRN: [id number] DOB/AGE: 14-Nov-1955 65 y.o.  Admit date: 08/31/2021 Discharge date: 09/01/2021  Admission Diagnoses:  Hx of total hip arthroplasty, left [Z96.642]  Surgeries:Procedure(s): Left total hip arthroplasty   SURGEON:  Jena Gauss. M.D.   ASSISTANT: Baldwin Jamaica, PA-C (present and scrubbed throughout the case, critical for assistance with exposure, retraction, instrumentation, and closure)   ANESTHESIA: spinal   ESTIMATED BLOOD LOSS: 50 mL   FLUIDS REPLACED: 1300 mL of crystalloid   DRAINS: 2 medium Hemovac drains   IMPLANTS UTILIZED: DePuy 12 mm large stature AML femoral stem, 54 mm OD Pinnacle 100 acetabular component, neutral Pinnacle Altrx polyethylene insert, and Theresa [name] 36 mm M-SPEC +1.5 mm hip ball  Discharge Diagnoses: Patient Active Problem List   Diagnosis Date Noted   Hx of total hip arthroplasty, left 08/31/2021   Change in bowel habits 08/30/2021   Hemorrhage of anus and rectum 08/30/2021   Pain in upper limb 08/30/2021   Avascular necrosis of left femoral head (HCC) 06/20/2021   Urticaria 05/21/2021   Pseudoarthrosis of cervical spine (HCC) 02/28/2020   Bilateral arm pain 02/08/2020   Elevated blood-pressure reading, without diagnosis of hypertension 01/22/2020   Numbness and tingling of both upper extremities 01/22/2020   Cystitis 01/21/2020   Bilateral hand pain 06/24/2018   Paresthesia of upper limb 04/28/2018   Stenosis of intervertebral foramina 04/21/2018   Carpal tunnel syndrome 03/24/2018   Aortic regurgitation 02/17/2018   Fall 02/04/2018   Cervicogenic headache 10/26/2017   Pseudoarthrosis of lumbar spine 10/26/2017   Tendinitis of left forearm 10/26/2017   Tendinitis of left rotator cuff 10/26/2017   Status post cervical spinal fusion 10/14/2017   History of lumbar fusion 09/20/2017   Lumbar stenosis with neurogenic claudication 08/31/2017   Degeneration of internal  semilunar cartilage of right knee 04/22/2017   Herniated nucleus pulposus, lumbar 10/28/2016   Hip pain 10/22/2016   Lumbago with sciatica 10/22/2016   Degenerative disc disease, lumbar 10/22/2016   Lumbar radiculopathy 10/22/2016   Lumbar spondylosis 10/22/2016   Backache 10/05/2016   Insomnia secondary to anxiety 01/31/2016   S/Theresa [name] TAH (total abdominal hysterectomy) 01/30/2016   Neck pain 03/04/2014   Brachial radiculitis 09/03/2013   Osteopenia 08/08/2013   Cervico-occipital neuralgia 03/08/2013   Spinal stenosis in cervical region 03/08/2013   Glaucoma (increased eye pressure) 01/07/2013   Thyromegaly 12/16/2012   Cervical spine degeneration 10/23/2012   Degenerative disc disease, cervical 04/13/2012   Special screening for malignant neoplasms, colon 09/18/2011   Breast cancer screening by mammogram 09/18/2011   Hypothyroidism 09/18/2011   Congenital heart defect 07[birth year]/1958    Past Medical History:  Diagnosis Date   Breast screening, unspecified    H/O: Bell's palsy    Headache    Heart murmur    History of kidney stones    Hypercalcemia    Osteoporosis    Screening for malignant neoplasm of the cervix    Special screening for malignant neoplasms, colon    Unspecified hypothyroidism    thyroid nodule   Unspecified otitis media      Transfusion:    Consultants (if any):   Discharged Condition: Improved  Hospital Course: Theresa Theresa [name] is an 65 y.o. female who was admitted 08/31/2021 with Theresa [name] diagnosis of avascular necrosis of the left hip and went to the operating room on 08/31/2021 and underwent left total hip arthroplasty through posterior approach. The patient received perioperative antibiotics for prophylaxis (see below). The patient  tolerated the procedure well and was transported to PACU in stable condition. After meeting PACU criteria, the patient was subsequently transferred to the Orthopaedics/Rehabilitation unit.   The patient received DVT prophylaxis in  the form of early mobilization, Lovenox, Foot Pumps, and TED hose. Theresa [name] sacral pad had been placed and heels were elevated off of the bed with rolled towels in order to protect skin integrity. Foley catheter was discontinued on postoperative day #0. Wound drains were discontinued on postoperative day #1. The surgical incision was healing well without signs of infection.  Physical therapy was initiated postoperatively for transfers, gait training, and strengthening. Occupational therapy was initiated for activities of daily living and evaluation for assisted devices. Rehabilitation goals were reviewed in detail with the patient. The patient made steady progress with physical therapy and physical therapy recommended discharge to Home.   The patient achieved the preliminary goals of this hospitalization and was felt to be medically and orthopaedically appropriate for discharge.  She was given perioperative antibiotics:  Anti-infectives (From admission, onward)    Start     Dose/Rate Route Frequency Ordered Stop   08/31/21 1736  ceFAZolin (ANCEF) 2-4 GM/100ML-% IVPB       Note to Pharmacy: Theresa Theresa [name] C: cabinet override      08/31/21 1736 08/31/21 1743   08/31/21 1330  ceFAZolin (ANCEF) IVPB 2g/100 mL premix        2 g 200 mL/hr over 30 Minutes Intravenous Every 6 hours 08/31/21 1130 08/31/21 1814   08/31/21 1215  ceFAZolin (ANCEF) 2-4 GM/100ML-% IVPB       Note to Pharmacy: Theresa Theresa [name]: cabinet override      08/31/21 1215 09/01/21 0029   08/31/21 0617  ceFAZolin (ANCEF) 2-4 GM/100ML-% IVPB       Note to Pharmacy: Theresa Theresa [name]: cabinet override      08/31/21 0617 08/31/21 1305   08/31/21 0600  ceFAZolin (ANCEF) IVPB 2g/100 mL premix        2 g 200 mL/hr over 30 Minutes Intravenous On call to O.R. 08/31/21 0014 08/31/21 0745     .  Recent vital signs:  Vitals:   09/01/21 1130 09/01/21 1515  BP: (!) 105/53 (!) 107/54  Pulse: 65 79  Resp: 12 12  Temp:    SpO2: 97% 94%     Recent laboratory studies:  No results for input(s): WBC, HGB, HCT, PLT, K, CL, CO2, BUN, CREATININE, GLUCOSE, CALCIUM, LABPT, INR in the last 72 hours.  Diagnostic Studies: DG Bone Density  Result Date: 08/17/2021 EXAM: DUAL X-RAY ABSORPTIOMETRY (DXA) FOR BONE MINERAL DENSITY IMPRESSION: Your patient Theresa Theresa [name] completed Theresa [name] BMD test on 08/17/2021 using the [employer] iDXA DXA System (software version: 14.10) manufactured by [employer]. The following summarizes the results of our evaluation. Technologist: SCE PATIENT BIOGRAPHICAL: Name: Theresa Theresa [name], Theresa Theresa [name] Patient ID: [id number] Birth Date: 1955/11/28 Height: 65.5 in. Gender: Female Exam Date: 08/17/2021 Weight: 154.8 lbs. Indications: Caucasian, Family Hist. (Parent hip fracture), Family Hx of Osteoporosis, Height Loss, Hypothyroid, Hysterectomy, Osteopenia, Parent Hip Fracture, Postmenopausal Fractures: Treatments: CALCIUM VIT D, Flonase, Gabapentin, LEVOTHYROXINE, Multi-Vitamin, Synthroid, Vitamin D, ZYRTEC DENSITOMETRY RESULTS: Site      Region     Measured Date Measured Age WHO Classification Young Adult T-score BMD         %Change vs. Previous Significant Change (*) AP Spine L1-L2 08/17/2021 63.3 Osteopenia -1.4 1.004 g/cm2 3.5% - AP Spine L1-L2 09/03/2019 61.4 Osteopenia -1.7 0.970 g/cm2 4.2% - AP Spine L1-L2 08/30/2017 59.4  Osteopenia -2.0 0.931 g/cm2 -1.7% - AP Spine L1-L2 08/04/2015 57.3 Osteopenia -1.9 0.947 g/cm2 2.3% - AP Spine L1-L2 08/02/2013 55.3 Osteopenia -2.0 0.926 g/cm2 -8.9% Yes AP Spine L1-L2 10/07/2010 52.5 Osteopenia -1.3 1.016 g/cm2 -1.1% - AP Spine L1-L2 09/19/2008 50.4 Osteopenia -1.2 1.027 g/cm2 - - DualFemur Neck Right 08/17/2021 63.3 Osteopenia -2.4 0.710 g/cm2 -3.3% - DualFemur Neck Right 09/03/2019 61.4 Osteopenia -2.2 0.734 g/cm2 3.5% - DualFemur Neck Right 08/30/2017 59.4 Osteopenia -2.4 0.709 g/cm2 -2.6% - DualFemur Neck Right 08/04/2015 57.3 Osteopenia -2.2 0.728 g/cm2 -2.3% - DualFemur Neck Right 08/02/2013 55.3  Osteopenia -2.1 0.745 g/cm2 -10.6% Yes DualFemur Neck Right 10/07/2010 52.5 Osteopenia -1.5 0.833 g/cm2 -2.2% - DualFemur Neck Right 09/19/2008 50.4 Osteopenia -1.3 0.852 g/cm2 - - DualFemur Total Mean 08/17/2021 63.3 Osteopenia -1.7 0.789 g/cm2 -0.8% - DualFemur Total Mean 09/03/2019 61.4 Osteopenia -1.7 0.795 g/cm2 2.2% - DualFemur Total Mean 08/30/2017 59.4 Osteopenia -1.8 0.778 g/cm2 -4.2% Yes DualFemur Total Mean 08/04/2015 57.3 Osteopenia -1.6 0.812 g/cm2 -0.5% - DualFemur Total Mean 08/02/2013 55.3 Osteopenia -1.5 0.816 g/cm2 -8.0% Yes DualFemur Total Mean 10/07/2010 52.5 Normal -1.0 0.887 g/cm2 -2.1% Yes DualFemur Total Mean 09/19/2008 50.4 Normal -0.8 0.906 g/cm2 - - ASSESSMENT: The BMD measured at Femur Neck Right is 0.710 g/cm2 with Theresa [name] T-score of -2.4. This patient is considered osteopenic according to World Health Organization [facility]) criteria. L3 and L4 was excluded due to degenerative changes. Compared with prior study, there has been no significant change in the spine. Compared with prior study, there has been no significant change in the total hip. World Science writer [employer]) criteria for post-menopausal, Caucasian Women: Normal:                   T-score at or above -1 SD Osteopenia/low bone mass: T-score between -1 and -2.5 SD Osteoporosis:             T-score at or below -2.5 SD RECOMMENDATIONS: 1. All patients should optimize calcium and vitamin D intake. 2. Consider FDA-approved medical therapies in postmenopausal women and men aged 30 years and older, based on the following: Theresa [name]. Theresa [name] hip or vertebral(clinical or morphometric) fracture b. T-score < -2.5 at the femoral neck or spine after appropriate evaluation to exclude secondary causes c. Low bone mass (T-score between -1.0 and -2.5 at the femoral neck or spine) and Theresa [name] 10-year probability of Theresa [name] hip fracture > 3% or Theresa [name] 10-year probability of Theresa [name] major osteoporosis-related fracture > 20% based on the [place]-adapted WHO algorithm 3. Clinician judgment and/or  patient preferences may indicate treatment for people with 10-year fracture probabilities above or below these levels FOLLOW-UP: People with diagnosed cases of osteoporosis or at high risk for fracture should have regular bone mineral density tests. For patients eligible for Medicare, routine testing is allowed once every 2 years. The testing frequency can be increased to one year for patients who have rapidly progressing disease, those who are receiving or discontinuing medical therapy to restore bone mass, or have additional risk factors. I have reviewed this report, and agree with the above findings. [facility] Radiology, Theresa [name].Theresa [name]. Dear Dr Darrick Huntsman, Your patient Theresa Theresa [name] completed Theresa [name] FRAX assessment on 08/17/2021 using the [facility] iDXA DXA System (analysis version: 14.10) manufactured by [employer]. The following summarizes the results of our evaluation. PATIENT BIOGRAPHICAL: Name: Theresa Theresa [name], Theresa Theresa [name] Patient ID: [id number] Birth Date: 12-Nov-1955 Height:    65.5 in. Gender:     Female    [age]  Weight:    154.8 lbs. Ethnicity:  White                            Exam Date: 08/17/2021 FRAX* RESULTS:  (version: 3.5) 10-year Probability of Fracture1 Major Osteoporotic Fracture2 Hip Fracture 22.3% 2.3% Population: [place] (Caucasian) Risk Factors: Family Hist. (Parent hip fracture) Based on Femur (Right) Neck BMD 1 -The 10-year probability of fracture may be lower than reported if the patient has received treatment. 2 -Major Osteoporotic Fracture: Clinical Spine, Forearm, Hip or Shoulder *FRAX is Theresa [name] Armed forces logistics/support/administrative officer of the [employer] for Metabolic Bone Disease, Theresa [name] World Science writer (WHO) [employer]. ASSESSMENT: The probability of Theresa [name] major osteoporotic fracture is 22.3% within the next ten years. The probability of Theresa [name] hip fracture is 2.3% within the next ten years. . Electronically Signed   By: Charlett Nose M.D.   On: 08/17/2021 10:54   MM 3D SCREEN BREAST  BILATERAL  Result Date: 08/17/2021 CLINICAL DATA:  Screening. EXAM: DIGITAL SCREENING BILATERAL MAMMOGRAM WITH TOMOSYNTHESIS AND CAD TECHNIQUE: Bilateral screening digital craniocaudal and mediolateral oblique mammograms were obtained. Bilateral screening digital breast tomosynthesis was performed. The images were evaluated with computer-aided detection. COMPARISON:  Previous exam(s). ACR Breast Density Category b: There are scattered areas of fibroglandular density. FINDINGS: There are no findings suspicious for malignancy. IMPRESSION: No mammographic evidence of malignancy. Theresa [name] result letter of this screening mammogram will be mailed directly to the patient. RECOMMENDATION: Screening mammogram in one year. (Code:SM-B-01Y) BI-RADS CATEGORY  1: Negative. Electronically Signed   By: Beckie Salts M.D.   On: 08/17/2021 14:24  DG Hip Port Unilat With Pelvis 1V Left  Result Date: 08/31/2021 CLINICAL DATA:  Left hip replacement surgery EXAM: DG HIP (WITH OR WITHOUT PELVIS) 1V PORT LEFT COMPARISON:  05/21/2021 FINDINGS: Interval left hip arthroplasty. Components project in expected location. No fracture or dislocation. Lateral drain and skin staples. Lower lumbar fusion hardware incompletely visualized. Degenerative disc disease L4-5 incidentally noted. IMPRESSION: Left hip arthroplasty without apparent complication. Electronically Signed   By: Corlis Leak M.D.   On: 08/31/2021 11:43    Discharge Medications:   Allergies as of 09/01/2021       Reactions   Meloxicam Other (See Comments)   Dizziness   Topiramate Other (See Comments)   Caused hair loss   Tramadol Itching, Other (See Comments)   Recently developed        Medication List     STOP taking these medications    ibuprofen 200 MG tablet Commonly known as: ADVIL       TAKE these medications    ALPRAZolam 0.25 MG tablet Commonly known as: XANAX Take 1 tablet (0.25 mg total) by mouth at bedtime as needed. for anxiety   amLODipine 2.5  MG tablet Commonly known as: NORVASC Take 0.5 tablets (1.25 mg total) by mouth daily.   celecoxib 200 MG capsule Commonly known as: CELEBREX Take 1 capsule (200 mg total) by mouth 2 (two) times daily.   cetirizine 10 MG tablet Commonly known as: ZYRTEC Take 10 mg by mouth daily.   enoxaparin 40 MG/0.4ML injection Commonly known as: LOVENOX Inject 0.4 mLs (40 mg total) into the skin daily for 14 days.   gabapentin 100 MG capsule Commonly known as: NEURONTIN Take 2 capsules (200 mg total) by mouth 3 (three) times daily. What changed:  how much to take when to take this   HYDROcodone-acetaminophen 5-325 MG tablet  Commonly known as: NORCO/VICODIN Take 1 tablet by mouth every 6 (six) hours as needed for moderate pain.   hydroxypropyl methylcellulose / hypromellose 2.5 % ophthalmic solution Commonly known as: ISOPTO TEARS / GONIOVISC Place 1 drop into both eyes 2 (two) times daily as needed for dry eyes.   levothyroxine 88 MCG tablet Commonly known as: SYNTHROID Take 1 tablet by mouth once daily   multivitamin tablet Take 1 tablet by mouth daily.   oxyCODONE 5 MG immediate release tablet Commonly known as: Oxy IR/ROXICODONE Take 1 tablet (5 mg total) by mouth every 4 (four) hours as needed for moderate pain (pain score 4-6).   Vitamin D3 25 MCG (1000 UT) Caps Take 1,000 Units by mouth daily.               Durable Medical Equipment  (From admission, onward)           Start     Ordered   08/31/21 1131  DME Walker rolling  Once       Question:  Patient needs Theresa [name] walker to treat with the following condition  Answer:  S/Theresa [name] total hip arthroplasty   08/31/21 1130   08/31/21 1131  DME Bedside commode  Once       Question:  Patient needs Theresa [name] bedside commode to treat with the following condition  Answer:  S/Theresa [name] total hip arthroplasty   08/31/21 1130            Disposition: Home with home health PT     Follow-up Information     Hooten, Illene Labrador, MD Follow up on  10/13/2021.   Specialty: Orthopedic Surgery Why: at 2:00pm Contact information: [address] [facility] [place] [address] [phone]                  Lasandra Beech, PA-C 09/01/2021, 3:40 PM

## 2021-09-02 ENCOUNTER — Encounter: Payer: Self-pay | Admitting: Internal Medicine

## 2021-09-02 LAB — SURGICAL PATHOLOGY

## 2021-09-08 ENCOUNTER — Other Ambulatory Visit: Payer: Medicare Other

## 2021-10-02 ENCOUNTER — Telehealth: Payer: Self-pay

## 2021-10-02 NOTE — Telephone Encounter (Signed)
Transition Care Management Follow-up Telephone Call Date of discharge and from where: 09/01/2021  [facility] How have you been since you were released from the hospital? Doing well Any questions or concerns? No  Items Reviewed: Did the pt receive and understand the discharge instructions provided? No  Medications obtained and verified? Yes  Other? Yes  Any new allergies since your discharge? No  Dietary orders reviewed? No Do you have support at home? Yes   Home Care and Equipment/Supplies: Were home health services ordered? yes If so, what is the name of the agency? [facility]  Has the agency set up a time to come to the patient's home? yes Were any new equipment or medical supplies ordered?  yes What is the name of the medical supply agency? [facility] Were you able to get the supplies/equipment? yes Do you have any questions related to the use of the equipment or supplies? No  Functional Questionnaire: (I = Independent and D = Dependent) ADLs: I  Bathing/Dressing- I  Meal Prep- I I Eating- I  Maintaining continence- I  Transferring/Ambulation- I  Managing Meds- I  Follow up appointments reviewed:  PCP Hospital f/u appt confirmed? No   Specialist Hospital f/u appt confirmed? Yes  Scheduled to see Ortho on 10/13/2021  Are transportation arrangements needed? No  If their condition worsens, is the pt aware to call PCP or go to the Emergency Dept.? Yes Was the patient provided with contact information for the PCP's office or ED? Yes Was to pt encouraged to call back with questions or concerns? Yes  Theresa Pavy, RN, BSN, CEN [facility] [employer] [phone]

## 2021-11-23 ENCOUNTER — Other Ambulatory Visit: Payer: Medicare Other

## 2021-11-23 ENCOUNTER — Other Ambulatory Visit: Payer: Self-pay

## 2021-11-23 DIAGNOSIS — E034 Atrophy of thyroid (acquired): Secondary | ICD-10-CM

## 2021-11-23 DIAGNOSIS — E785 Hyperlipidemia, unspecified: Secondary | ICD-10-CM

## 2021-11-23 DIAGNOSIS — M8589 Other specified disorders of bone density and structure, multiple sites: Secondary | ICD-10-CM

## 2021-11-24 LAB — COMPREHENSIVE METABOLIC PANEL
AG Ratio: 2 (calc) (ref 1.0–2.5)
ALT: 14 U/L (ref 6–29)
AST: 16 U/L (ref 10–35)
Albumin: 4.3 g/dL (ref 3.6–5.1)
Alkaline phosphatase (APISO): 72 U/L (ref 37–153)
BUN: 10 mg/dL (ref 7–25)
CO2: 32 mmol/L (ref 20–32)
Calcium: 9.4 mg/dL (ref 8.6–10.4)
Chloride: 105 mmol/L (ref 98–110)
Creat: 0.77 mg/dL (ref 0.50–1.05)
Globulin: 2.1 g/dL (calc) (ref 1.9–3.7)
Glucose, Bld: 95 mg/dL (ref 65–99)
Potassium: 5 mmol/L (ref 3.5–5.3)
Sodium: 142 mmol/L (ref 135–146)
Total Bilirubin: 0.5 mg/dL (ref 0.2–1.2)
Total Protein: 6.4 g/dL (ref 6.1–8.1)

## 2021-11-24 LAB — LIPID PANEL
Cholesterol: 213 mg/dL — ABNORMAL HIGH (ref <200)
HDL: 73 mg/dL (ref >=50)
LDL Cholesterol (Calc): 118 mg/dL (calc) — ABNORMAL HIGH
Non-HDL Cholesterol (Calc): 140 mg/dL (calc) — ABNORMAL HIGH (ref <130)
Total CHOL/HDL Ratio: 2.9 (calc) (ref <5.0)
Triglycerides: 116 mg/dL (ref <150)

## 2021-11-24 LAB — TSH: TSH: 3.58 mIU/L (ref 0.40–4.50)

## 2021-11-30 ENCOUNTER — Other Ambulatory Visit: Payer: Self-pay | Admitting: Internal Medicine

## 2021-11-30 DIAGNOSIS — M792 Neuralgia and neuritis, unspecified: Secondary | ICD-10-CM

## 2021-11-30 DIAGNOSIS — B029 Zoster without complications: Secondary | ICD-10-CM

## 2021-12-25 ENCOUNTER — Other Ambulatory Visit: Payer: Self-pay

## 2021-12-25 MED ORDER — LEVOTHYROXINE SODIUM 88 MCG PO TABS: ORAL_TABLET | ORAL | 1 refills | Status: DC

## 2022-01-07 ENCOUNTER — Encounter: Payer: Self-pay | Admitting: Internal Medicine

## 2022-01-07 ENCOUNTER — Telehealth (INDEPENDENT_AMBULATORY_CARE_PROVIDER_SITE_OTHER): Payer: Medicare Other | Admitting: Internal Medicine

## 2022-01-07 VITALS — Temp 99.6°F

## 2022-01-07 DIAGNOSIS — G510 Bell's palsy: Secondary | ICD-10-CM | POA: Diagnosis not present

## 2022-01-07 DIAGNOSIS — K121 Other forms of stomatitis: Secondary | ICD-10-CM | POA: Diagnosis not present

## 2022-01-07 DIAGNOSIS — B009 Herpesviral infection, unspecified: Secondary | ICD-10-CM | POA: Diagnosis not present

## 2022-01-07 MED ORDER — VALACYCLOVIR HCL 1 G PO TABS: 1000.0000 mg | ORAL_TABLET | Freq: Three times a day (TID) | ORAL | 0 refills | Status: DC

## 2022-01-07 MED ORDER — PREDNISONE 20 MG PO TABS: 60.0000 mg | ORAL_TABLET | Freq: Every day | ORAL | 0 refills | Status: AC

## 2022-01-07 NOTE — Patient Instructions (Signed)
Bell's Palsy, Adult  Bell's palsy is a short-term inability to move muscles in a part of the face. The inability to move, also called paralysis, results from inflammation or compression of the seventh cranial nerve. This nerve travels along the skull and under the ear to the side of the face. This nerve is responsible for facial movements that include blinking, closing the eyes, smiling, and frowning. What are the causes? The exact cause of this condition is not known. It may be caused by an infection from a virus, such as the chickenpox (herpes zoster), Epstein-Barr, or mumps virus. What increases the risk? You are more likely to develop this condition if: You are pregnant. You have diabetes. You have had a recent infection in your nose, throat, or airways. You have a weakened body defense system (immune system). You have had a facial injury, such as a fracture. You have a family history of Bell's palsy. What are the signs or symptoms? Symptoms of this condition include: Weakness on one side of the face. Drooping eyelid and corner of the mouth. Excessive tearing in one eye. Difficulty closing the eyelid. Dry eye. Drooling. Dry mouth. Changes in taste. Change in facial appearance. Pain behind one ear. Ringing in one or both ears. Sensitivity to sound in one ear. Facial twitching. Headache. Impaired speech. Dizziness. Difficulty eating or drinking. Most of the time, only one side of the face is affected. In rare cases, Bell's palsy may affect the whole face. How is this diagnosed? This condition is diagnosed based on: Your symptoms. Your medical history. A physical exam. You may also have to see health care providers who specialize in disorders of the nerves (neurologist) or diseases and conditions of the eye (ophthalmologist). You may have tests, such as: A test to check for nerve damage (electromyogram). Imaging studies, such as a CT scan or an MRI. Blood tests. How is this  treated? This condition affects every person differently. Sometimes symptoms go away without treatment within a couple weeks. If treatment is needed, it varies from person to person. The goal of treatment is to reduce inflammation and protect the eye from damage. Treatment for Bell's palsy may include: Medicines, such as: Steroids to reduce swelling and inflammation. Antiviral medicines. Pain relievers, including aspirin, acetaminophen, or ibuprofen. Eye drops or ointment to keep your eye moist. Eye protection, if you cannot close your eye. Exercises or massage to regain muscle strength and function (physical therapy). Follow these instructions at home:  Take over-the-counter and prescription medicines only as told by your health care provider. If your eye is affected: Keep your eye moist with eye drops or ointment as told by your health care provider. Follow instructions for eye care and protection as told by your health care provider. Do any physical therapy exercises as told by your health care provider. Keep all follow-up visits. This is important. Contact a health care provider if: You have a fever or chills. Your symptoms do not get better within 2-3 weeks, or your symptoms get worse. Your eye is red, irritated, or painful. You have new symptoms. Get help right away if: You have weakness or numbness in a part of your body other than your face. You have trouble swallowing. You develop neck pain or stiffness. You develop dizziness or shortness of breath. Summary Bell's palsy is a short-term inability to move muscles in a part of the face. The inability to move results from inflammation or compression of the facial nerve. This condition affects every person   differently. Sometimes symptoms go away without treatment within a couple weeks. If treatment is needed, it varies from person to person. The goal of treatment is to reduce inflammation and protect the eye from damage. Contact  your health care provider if your symptoms do not get better within 2-3 weeks, or your symptoms get worse. This information is not intended to replace advice given to you by your health care provider. Make sure you discuss any questions you have with your health care provider. Document Revised: 05/29/2020 Document Reviewed: 05/29/2020 Elsevier Patient Education  2023 Elsevier Inc.  

## 2022-01-07 NOTE — Telephone Encounter (Signed)
Spoke with pt and scheduled her for a virtual today with Dr. French Ana ?

## 2022-01-07 NOTE — Progress Notes (Signed)
Virtual Visit via Video Note ? ?I connected with Theresa [name] ? on 01/07/22 at  9:40 AM EDT by a video enabled telemedicine application and verified that I am speaking with the correct person using two identifiers. ? Location patient: [place] ?Location provider:work or home office ?Persons participating in the virtual visit: patient, provider ? ?I discussed the limitations and requested verbal permission for telemedicine visit. The patient expressed understanding and agreed to proceed. ? ? ?HPI: ? ?Acute telemedicine visit for : ?Pt c/o R ear pain, sore throat and headache. Sx's started Tuesday, started with ulcer in mouth right side of tongue and everything progressed since then. I.e ulcer spread and moved back on right side of tongue and right throat started hurting and right ear pain Temp 99.6, concerned about bell's palsy on R side had 5 times in the past and started with same sx's she has now. Taking hydrocodone with minor relief. ?Not discussed ulcer with dental dr. Lavone Neri but if persists then will  ?No h/o HSV titer checked ?Prior MRI brain negative per pt years ago but facial nerve had irritation on right side and small area for facial nerve to pass through  ?She also has +pnd ?She states right check abnormal sensation ?Tried peroxide rinses which help  ?No redness/whiteness throat  ?In the past when had this given antivirals and steroids which help  ?Not currently est with neurology had NS in the past ? ?-Pertinent past medical history: see below ?-Pertinent medication allergies: ?Allergies  ?Allergen Reactions  ? Meloxicam Other (See Comments)  ?  Dizziness  ? Topiramate Other (See Comments)  ?  Caused hair loss  ? Tramadol Itching and Other (See Comments)  ?  Recently developed  ? ?-COVID-19 vaccine status:  ?Immunization History  ?Administered Date(s) Administered  ? Influenza-Unspecified 05/31/2017, 06/20/2018, 04/26/2019  ? MMR 04/13/2018, 05/11/2018  ? PFIZER(Purple Top)SARS-COV-2 Vaccination  12/14/2019, 01/09/2020, 07/03/2020, 01/27/2021  ? Tdap 09/17/2011  ? Zoster Recombinat (Shingrix) 03/23/2018, 05/25/2018  ? ? ? ?ROS: See pertinent positives and negatives per HPI. ? ?Past Medical History:  ?Diagnosis Date  ? Breast screening, unspecified   ? H/O: Bell's palsy   ? Headache   ? Heart murmur   ? History of kidney stones   ? Hypercalcemia   ? Osteoporosis   ? Screening for malignant neoplasm of the cervix   ? Special screening for malignant neoplasms, colon   ? Unspecified hypothyroidism   ? thyroid nodule  ? Unspecified otitis media   ? ? ?Past Surgical History:  ?Procedure Laterality Date  ? ANTERIOR CERVICAL DECOMP/DISCECTOMY FUSION  13,15  ? C3 ,4 ,5 [facility]  ? ANTERIOR FUSION CERVICAL SPINE  2013  ? [facility]  c5-c6  ? CHOLECYSTECTOMY  1989  ? dental implant    ? MENISCUS REPAIR Right 01/2010  ? [facility]  ? SPINE SURGERY    ? [facility], C3, 4 5  ? TOTAL ABDOMINAL HYSTERECTOMY    ? secondary to fibroids still has ovaries  ? TOTAL HIP ARTHROPLASTY Left 08/31/2021  ? Procedure: TOTAL HIP ARTHROPLASTY;  Surgeon: Dereck Leep, MD;  Location: [facility] ORS;  Service: Orthopedics;  Laterality: Left;  ? ? ? ?Current Outpatient Medications:  ?  ALPRAZolam (XANAX) 0.25 MG tablet, Take 1 tablet (0.25 mg total) by mouth at bedtime as needed. for anxiety, Disp: 30 tablet, Rfl: 3 ?  cetirizine (ZYRTEC) 10 MG tablet, Take 10 mg by mouth daily., Disp: , Rfl:  ?  Cholecalciferol (VITAMIN D3) 1000 units CAPS,  Take 1,000 Units by mouth daily. , Disp: , Rfl:  ?  gabapentin (NEURONTIN) 100 MG capsule, TAKE 2 CAPSULES BY MOUTH THREE TIMES DAILY, Disp: 180 capsule, Rfl: 0 ?  HYDROcodone-acetaminophen (NORCO/VICODIN) 5-325 MG tablet, Take 1 tablet by mouth every 6 (six) hours as needed for moderate pain., Disp: , Rfl:  ?  hydroxypropyl methylcellulose / hypromellose (ISOPTO TEARS / GONIOVISC) 2.5 % ophthalmic solution, Place 1 drop into both eyes 2 (two) times daily as needed for dry eyes., Disp: , Rfl:  ?  levothyroxine  (SYNTHROID) 88 MCG tablet, Take 1 tablet by mouth once daily, Disp: 90 tablet, Rfl: 1 ?  Multiple Vitamin (MULTIVITAMIN) tablet, Take 1 tablet by mouth daily., Disp: , Rfl:  ?  predniSONE (DELTASONE) 20 MG tablet, Take 3 tablets (60 mg total) by mouth daily with breakfast for 7 days., Disp: 21 tablet, Rfl: 0 ?  valACYclovir (VALTREX) 1000 MG tablet, Take 1 tablet (1,000 mg total) by mouth 3 (three) times daily. X7 to 10 days, Disp: 30 tablet, Rfl: 0 ?  amLODipine (NORVASC) 2.5 MG tablet, Take 0.5 tablets (1.25 mg total) by mouth daily., Disp: 45 tablet, Rfl: 3 ?  celecoxib (CELEBREX) 200 MG capsule, Take 1 capsule (200 mg total) by mouth 2 (two) times daily., Disp: 90 capsule, Rfl: 0 ?  enoxaparin (LOVENOX) 40 MG/0.4ML injection, Inject 0.4 mLs (40 mg total) into the skin daily for 14 days., Disp: 5.6 mL, Rfl: 0 ?  oxyCODONE (OXY IR/ROXICODONE) 5 MG immediate release tablet, Take 1 tablet (5 mg total) by mouth every 4 (four) hours as needed for moderate pain (pain score 4-6). (Patient not taking: Reported on 01/07/2022), Disp: 30 tablet, Rfl: 0 ? ?EXAM: ? ?VITALS per patient if applicable: ? ?GENERAL: alert, oriented, appears well and in no acute distress ? ?HEENT: atraumatic, conjunttiva clear, no obvious abnormalities on inspection of external nose and ears ? ?NECK: normal movements of the head and neck ? ?LUNGS: on inspection no signs of respiratory distress, breathing rate appears normal, no obvious gross SOB, gasping or wheezing ? ?CV: no obvious cyanosis ? ?MS: moves all visible extremities without noticeable abnormality ? ?PSYCH/NEURO: pleasant and cooperative, no obvious depression or anxiety, speech and thought processing grossly intact no facial or eyelid drooping ? ? ?ASSESSMENT AND PLAN: ? ?Discussed the following assessment and plan: ? ?Bell's palsy recurrence with oral ulceration - Plan: valACYclovir (VALTREX) 1000 MG tablet tid x 7-10 days, predniSONE (DELTASONE) 60 MG tablet x 1 week  ?Consider  neurology in the future declines for now ? ?Oral ulcer - Plan: valACYclovir (VALTREX) 1000 MG tablet ?HSV infection - Plan: valACYclovir (VALTREX) 1000 MG tablet ?If not better f/u with dental  ?Consider titer hsv in the future or further w/u with other labs  ?Declines magic mouthwash for now ? ?-we discussed possible serious and likely etiologies, options for evaluation and workup, limitations of telemedicine visit vs in person visit, treatment, treatment risks and precautions. Pt is agreeable to treatment via telemedicine at this moment.  ? ?  ?I discussed the assessment and treatment plan with the patient. The patient was provided an opportunity to ask questions and all were answered. The patient agreed with the plan and demonstrated an understanding of the instructions. ?  ? ?Time spent 20 minutes ?Nino Glow McLean-Scocuzza, MD   ?

## 2022-01-08 ENCOUNTER — Ambulatory Visit: Payer: Medicare Other | Admitting: Family Medicine

## 2022-02-01 ENCOUNTER — Other Ambulatory Visit: Payer: Self-pay | Admitting: Internal Medicine

## 2022-02-01 DIAGNOSIS — B029 Zoster without complications: Secondary | ICD-10-CM

## 2022-02-01 DIAGNOSIS — M792 Neuralgia and neuritis, unspecified: Secondary | ICD-10-CM

## 2022-04-19 ENCOUNTER — Telehealth: Payer: Self-pay

## 2022-04-19 DIAGNOSIS — R7301 Impaired fasting glucose: Secondary | ICD-10-CM

## 2022-04-19 DIAGNOSIS — E785 Hyperlipidemia, unspecified: Secondary | ICD-10-CM

## 2022-04-19 DIAGNOSIS — Z79899 Other long term (current) drug therapy: Secondary | ICD-10-CM

## 2022-04-19 DIAGNOSIS — E034 Atrophy of thyroid (acquired): Secondary | ICD-10-CM

## 2022-04-19 DIAGNOSIS — I10 Essential (primary) hypertension: Secondary | ICD-10-CM

## 2022-04-19 NOTE — Telephone Encounter (Signed)
Patient states she is scheduled to have her physical with Dr. Duncan Dull on 05/24/2022, but she does not have a lab appointment scheduled prior to her visit.  I noticed that there is a note about patient having fasting labs with her annual exam, but we do not have lab orders in Epic at this time.

## 2022-04-22 NOTE — Telephone Encounter (Signed)
Labs have been ordered and appt has been scheduled. Pt is aware of appt date and time.  

## 2022-05-19 ENCOUNTER — Telehealth (INDEPENDENT_AMBULATORY_CARE_PROVIDER_SITE_OTHER): Payer: Medicare Other | Admitting: Internal Medicine

## 2022-05-19 ENCOUNTER — Other Ambulatory Visit: Payer: Self-pay | Admitting: Internal Medicine

## 2022-05-19 ENCOUNTER — Telehealth: Payer: Self-pay

## 2022-05-19 ENCOUNTER — Encounter: Payer: Self-pay | Admitting: Internal Medicine

## 2022-05-19 DIAGNOSIS — U071 COVID-19: Secondary | ICD-10-CM | POA: Diagnosis not present

## 2022-05-19 DIAGNOSIS — F5105 Insomnia due to other mental disorder: Secondary | ICD-10-CM

## 2022-05-19 DIAGNOSIS — B029 Zoster without complications: Secondary | ICD-10-CM

## 2022-05-19 DIAGNOSIS — M792 Neuralgia and neuritis, unspecified: Secondary | ICD-10-CM

## 2022-05-19 DIAGNOSIS — F419 Anxiety disorder, unspecified: Secondary | ICD-10-CM

## 2022-05-19 MED ORDER — ONDANSETRON HCL 4 MG PO TABS: 4.0000 mg | ORAL_TABLET | Freq: Three times a day (TID) | ORAL | 0 refills | Status: DC | PRN

## 2022-05-19 MED ORDER — MOLNUPIRAVIR EUA 200MG CAPSULE: 4.0000 | ORAL_CAPSULE | Freq: Two times a day (BID) | ORAL | 0 refills | Status: AC

## 2022-05-19 MED ORDER — PREDNISONE 10 MG PO TABS: ORAL_TABLET | ORAL | 0 refills | Status: DC

## 2022-05-19 NOTE — Telephone Encounter (Signed)
Patient states she tested positive for covid yesterday with a home test.  Patient states her symptoms started on Monday.  Patient states her temperature was 99.2 and has gone up to 100.  Patient would like to know if Dr. Duncan Dull recommends Paxlovid.

## 2022-05-19 NOTE — Assessment & Plan Note (Signed)
Advised to try natural product called Relaxium ..encouarged to reduce wine to one glass nightly

## 2022-05-19 NOTE — Assessment & Plan Note (Signed)
Sending molnupiravir and prednisone taper to pharmacy.  Ok to use imodium prn loose stools,  Modify diet,

## 2022-05-19 NOTE — Progress Notes (Signed)
Virtual Visit via Caregility   Note    This format is felt to be most appropriate for this patient at this time.  All issues noted in this document were discussed and addressed.  No physical exam was performed (except for noted visual exam findings with Video Visits).   I connected with Becky on 05/19/22 at  3:30 PM EDT by a video enabled telemedicine application  and verified that I am speaking with the correct person using two identifiers. Location patient: home Location provider: work or home office Persons participating in the virtual visit: patient, provider  I discussed the limitations, risks, security and privacy concerns of performing an evaluation and management service by telephone and the availability of in person appointments. I also discussed with the patient that there may be a patient responsible charge related to this service. The patient expressed understanding and agreed to proceed.  Reason for visit: COVID infection   HPI:  1) Healthy 66 yr old female presents with 5 day history of rhinitis, rhinorrhea, mild sinus congestion, anorexia, loose stools,  and body aches .  Tested positive for COVID on Sept 5.  Using tylenol for joint pain   2) Insomnia:  using alprazolam prn. Does not want to use it regularly .  Drinks 1-2 glasses of wine 4 hours before bedtime    ROS: See pertinent positives and negatives per HPI.  Past Medical History:  Diagnosis Date   Breast screening, unspecified    H/O: Bell's palsy    recurrent   Headache    Heart murmur    History of kidney stones    Hypercalcemia    Osteoporosis    Screening for malignant neoplasm of the cervix    Special screening for malignant neoplasms, colon    Unspecified hypothyroidism    thyroid nodule   Unspecified otitis media     Past Surgical History:  Procedure Laterality Date   ANTERIOR CERVICAL DECOMP/DISCECTOMY FUSION  13,15   C3 ,4 ,5 [facility]   ANTERIOR FUSION CERVICAL SPINE  2013   [facility]  c5-c6    CHOLECYSTECTOMY  1989   dental implant     MENISCUS REPAIR Right 01/2010   [facility]   SPINE SURGERY     [facility], C3, 4 5   TOTAL ABDOMINAL HYSTERECTOMY     secondary to fibroids still has ovaries   TOTAL HIP ARTHROPLASTY Left 08/31/2021   Procedure: TOTAL HIP ARTHROPLASTY;  Surgeon: Donato Heinz, MD;  Location: [facility] OR[facility];  Service: Orthopedics;  Laterality: Left;    Family History  Problem Relation Age of Onset   Thyroid nodules Mother    Alzheimer's disease Mother    Hyperlipidemia Father    Breast cancer Paternal Aunt    Thyroid nodules Sister    Cancer Sister 25       in situ   Breast cancer Sister 39   Thyroid nodules Maternal Grandmother     SOCIAL HX:  reports that she has never smoked. She has never used smokeless tobacco. She reports current alcohol use of about 5.0 standard drinks of alcohol per week. She reports that she does not use drugs.    Current Outpatient Medications:    ALPRAZolam (XANAX) 0.25 MG tablet, Take 1 tablet (0.25 mg total) by mouth at bedtime as needed. for anxiety, Disp: 30 tablet, Rfl: 3   cetirizine (ZYRTEC) 10 MG tablet, Take 10 mg by mouth daily., Disp: , Rfl:    Cholecalciferol (VITAMIN D3) 1000 units CAPS, Take 1,000 Units  by mouth daily. , Disp: , Rfl:    hydroxypropyl methylcellulose / hypromellose (ISOPTO TEARS / GONIOVISC) 2.5 % ophthalmic solution, Place 1 drop into both eyes 2 (two) times daily as needed for dry eyes., Disp: , Rfl:    levothyroxine (SYNTHROID) 88 MCG tablet, Take 1 tablet by mouth once daily, Disp: 90 tablet, Rfl: 1   molnupiravir EUA (LAGEVRIO) 200 mg CAPS capsule, Take 4 capsules (800 mg total) by mouth 2 (two) times daily for 5 days., Disp: 40 capsule, Rfl: 0   Multiple Vitamin (MULTIVITAMIN) tablet, Take 1 tablet by mouth daily., Disp: , Rfl:    ondansetron (ZOFRAN) 4 MG tablet, Take 1 tablet (4 mg total) by mouth every 8 (eight) hours as needed for nausea or vomiting., Disp: 20 tablet, Rfl: 0   predniSONE (DELTASONE)  10 MG tablet, 6 tablets on Day 1 , then reduce by 1 tablet daily until gone, Disp: 21 tablet, Rfl: 0   gabapentin (NEURONTIN) 100 MG capsule, TAKE 2 CAPSULES BY MOUTH THREE TIMES DAILY (Patient not taking: Reported on 05/19/2022), Disp: 180 capsule, Rfl: 0   HYDROcodone-acetaminophen (NORCO/VICODIN) 5-325 MG tablet, Take 1 tablet by mouth every 6 (six) hours as needed for moderate pain. (Patient not taking: Reported on 05/19/2022), Disp: , Rfl:   EXAM:  VITALS per patient if applicable:  GENERAL: alert, oriented, appears well and in no acute distress  HEENT: atraumatic, conjunttiva clear, no obvious abnormalities on inspection of external nose and ears  NECK: normal movements of the head and neck  LUNGS: on inspection no signs of respiratory distress, breathing rate appears normal, no obvious gross SOB, gasping or wheezing  CV: no obvious cyanosis  MS: moves all visible extremities without noticeable abnormality  PSYCH/NEURO: pleasant and cooperative, no obvious depression or anxiety, speech and thought processing grossly intact  ASSESSMENT AND PLAN:  Discussed the following assessment and plan:  COVID-19  Insomnia secondary to anxiety  COVID-19 Sending molnupiravir and prednisone taper to pharmacy.  Ok to use imodium prn loose stools,  Modify diet,   Insomnia secondary to anxiety Advised to try natural product called Relaxium ..encouarged to reduce wine to one glass nightly    I discussed the assessment and treatment plan with the patient. The patient was provided an opportunity to ask questions and all were answered. The patient agreed with the plan and demonstrated an understanding of the instructions.   The patient was advised to call back or seek an in-person evaluation if the symptoms worsen or if the condition fails to improve as anticipated.   I spent 20 minutes dedicated to the care of this patient on the date of this encounter to include pre-visit review of his medical  history,  Face-to-face time with the patient , and post visit ordering of testing and therapeutics.    Sherlene Shams, MD

## 2022-05-19 NOTE — Telephone Encounter (Signed)
Pt has been scheduled for a virtual visit with Dr. Darrick Huntsman today at 3:30.

## 2022-05-21 ENCOUNTER — Other Ambulatory Visit: Payer: Medicare Other

## 2022-05-24 ENCOUNTER — Ambulatory Visit: Payer: Medicare Other | Admitting: Internal Medicine

## 2022-05-26 ENCOUNTER — Encounter: Payer: Self-pay | Admitting: Internal Medicine

## 2022-05-26 DIAGNOSIS — Z1231 Encounter for screening mammogram for malignant neoplasm of breast: Secondary | ICD-10-CM

## 2022-06-17 ENCOUNTER — Encounter: Payer: Self-pay | Admitting: Internal Medicine

## 2022-06-17 NOTE — Telephone Encounter (Signed)
Refilled: 05/22/2021 Last OV: 05/19/2022 Next OV: 07/26/2022

## 2022-06-18 MED ORDER — ALPRAZOLAM 0.25 MG PO TABS: 0.2500 mg | ORAL_TABLET | Freq: Every evening | ORAL | 5 refills | Status: DC | PRN

## 2022-06-21 ENCOUNTER — Ambulatory Visit (INDEPENDENT_AMBULATORY_CARE_PROVIDER_SITE_OTHER): Payer: Medicare Other

## 2022-06-21 VITALS — Ht 65.0 in | Wt 145.0 lb

## 2022-06-21 DIAGNOSIS — Z0000 Encounter for general adult medical examination without abnormal findings: Secondary | ICD-10-CM | POA: Diagnosis not present

## 2022-06-21 NOTE — Progress Notes (Addendum)
Subjective:   Theresa [name] is a 66 y.o. female who presents for an Initial Medicare Annual Wellness Visit.  Review of Systems    No ROS.  Medicare Wellness Virtual Visit.  Visual/audio telehealth visit, UTA vital signs.   See social history for additional risk factors.   Cardiac Risk Factors include: advanced age (>58men, >22 women)     Objective:    Today's Vitals   06/21/22 1532  Weight: 145 lb (65.8 kg)  Height: $Remove'5\' 5"'xfATWrl$  (1.651 m)   Body mass index is 24.13 kg/m.     06/21/2022    3:43 PM 08/31/2021   11:48 AM 08/17/2021    2:03 PM 08/26/2017   10:19 AM  Advanced Directives  Does Patient Have a Medical Advance Directive? Yes Yes Yes No  Type of Paramedic of [place];Living will Healthcare Power of [place];Living will   Does patient want to make changes to medical advance directive? No - Patient declined No - Patient declined No - Patient declined   Copy of [place] in Chart? Yes - validated most recent copy scanned in chart (See row information)  Yes - validated most recent copy scanned in chart (See row information)   Would patient like information on creating a medical advance directive?    No - Patient declined    Current Medications (verified) Outpatient Encounter Medications as of 06/21/2022  Medication Sig   ALPRAZolam (XANAX) 0.25 MG tablet Take 1 tablet (0.25 mg total) by mouth at bedtime as needed. for anxiety   cetirizine (ZYRTEC) 10 MG tablet Take 10 mg by mouth daily.   Cholecalciferol (VITAMIN D3) 1000 units CAPS Take 1,000 Units by mouth daily.    gabapentin (NEURONTIN) 100 MG capsule TAKE 2 CAPSULES BY MOUTH THREE TIMES DAILY (Patient not taking: Reported on 05/19/2022)   HYDROcodone-acetaminophen (NORCO/VICODIN) 5-325 MG tablet Take 1 tablet by mouth every 6 (six) hours as needed for moderate pain. (Patient not taking: Reported on 05/19/2022)   hydroxypropyl methylcellulose /  hypromellose (ISOPTO TEARS / GONIOVISC) 2.5 % ophthalmic solution Place 1 drop into both eyes 2 (two) times daily as needed for dry eyes.   levothyroxine (SYNTHROID) 88 MCG tablet Take 1 tablet by mouth once daily   Multiple Vitamin (MULTIVITAMIN) tablet Take 1 tablet by mouth daily.   ondansetron (ZOFRAN) 4 MG tablet Take 1 tablet (4 mg total) by mouth every 8 (eight) hours as needed for nausea or vomiting.   predniSONE (DELTASONE) 10 MG tablet 6 tablets on Day 1 , then reduce by 1 tablet daily until gone   No facility-administered encounter medications on file as of 06/21/2022.    Allergies (verified) Meloxicam, Topiramate, and Tramadol   History: Past Medical History:  Diagnosis Date   Breast screening, unspecified    H/O: Bell's palsy    recurrent   Headache    Heart murmur    History of kidney stones    Hypercalcemia    Osteoporosis    Screening for malignant neoplasm of the cervix    Special screening for malignant neoplasms, colon    Unspecified hypothyroidism    thyroid nodule   Unspecified otitis media    Past Surgical History:  Procedure Laterality Date   ANTERIOR CERVICAL DECOMP/DISCECTOMY FUSION  13,15   C3 ,4 ,5 [place]   ANTERIOR FUSION CERVICAL SPINE  2013   [facility]  c5-c6   CHOLECYSTECTOMY  1989   dental implant     MENISCUS  REPAIR Right 01/2010   [facility]   SPINE SURGERY     [place], C3, 4 5   TOTAL ABDOMINAL HYSTERECTOMY     secondary to fibroids still has ovaries   TOTAL HIP ARTHROPLASTY Left 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/2021   Procedure: TOTAL HIP ARTHROPLASTY;  Surgeon: Dereck Leep, MD;  Location: [facility] ORS;  Service: Orthopedics;  Laterality: Left;   Family History  Problem Relation Age of Onset   Thyroid nodules Mother    Alzheimer's disease Mother    Hyperlipidemia Father    Breast cancer Paternal Aunt    Thyroid nodules Sister    Cancer Sister 12       in situ   Breast cancer Sister 3   Thyroid nodules Maternal Grandmother    Social History   Socioeconomic  History   Marital status: Married    Spouse name: Not on file   Number of children: Not on file   Years of education: Not on file   Highest education level: Not on file  Occupational History   Occupation: Copywriter, advertising  Tobacco Use   Smoking status: Never   Smokeless tobacco: Never  Vaping Use   Vaping Use: Never used  Substance and Sexual Activity   Alcohol use: Yes    Alcohol/week: 5.0 standard drinks of alcohol    Types: 5 Standard drinks or equivalent per week    Comment: Occasional   Drug use: No   Sexual activity: Not on file  Other Topics Concern   Not on file  Social History Narrative   Lives with spouse, son graduated from [employer].   Social Determinants of Health   Financial Resource Strain: Low Risk  (06/21/2022)   Overall Financial Resource Strain (CARDIA)    Difficulty of Paying Living Expenses: Not hard at all  Food Insecurity: No Food Insecurity (06/21/2022)   Hunger Vital Sign    Worried About Running Out of Food in the Last Year: Never true    Ran Out of Food in the Last Year: Never true  Transportation Needs: No Transportation Needs (06/21/2022)   PRAPARE - Hydrologist (Medical): No    Lack of Transportation (Non-Medical): No  Physical Activity: Sufficiently Active (06/21/2022)   Exercise Vital Sign    Days of Exercise per Week: 7 days    Minutes of Exercise per Session: 50 min  Stress: No Stress Concern Present (06/21/2022)   [place]    Feeling of Stress : Not at all  Social Connections: Unknown (06/21/2022)   Social Connection and Isolation Panel [NHANES]    Frequency of Communication with Friends and Family: Not on file    Frequency of Social Gatherings with Friends and Family: Not on file    Attends Religious Services: Not on file    Active Member of Clubs or Organizations: Not on file    Attends Archivist Meetings: Not on file     Marital Status: Married    Tobacco Counseling Counseling given: Not Answered   Clinical Intake:  Pre-visit preparation completed: Yes        Diabetes: No  How often do you need to have someone help you when you read instructions, pamphlets, or other written materials from your doctor or pharmacy?: 1 - Never    Interpreter Needed?: No      Activities of Daily Living    06/21/2022    3:35 PM 08/31/2021   11:43 AM  In  your present state of health, do you have any difficulty performing the following activities:  Hearing? 0 0  Vision? 0 0  Difficulty concentrating or making decisions? 0 0  Walking or climbing stairs? 0 0  Dressing or bathing? 0 1  Doing errands, shopping? 0 0  Preparing Food and eating ? N   Using the Toilet? N   In the past six months, have you accidently leaked urine? N   Do you have problems with loss of bowel control? N   Managing your Medications? N   Managing your Finances? N   Housekeeping or managing your Housekeeping? N     Patient Care Team: Crecencio Mc, MD as PCP - General (Internal Medicine)  Indicate any recent Medical Services you may have received from other than [facility] providers in the past year (date may be approximate).     Assessment:   This is a routine wellness examination for Alexica.  I connected with  Daja Shuping on 06/21/22 by a audio enabled telemedicine application and verified that I am speaking with the correct person using two identifiers.  Patient Location: Home  Provider Location: Office/Clinic  I discussed the limitations of evaluation and management by telemedicine. The patient expressed understanding and agreed to proceed.   Hearing/Vision screen Hearing Screening - Comments:: Patient is able to hear conversational tones without difficulty.  No issues reported.   Vision Screening - Comments:: Followed by [facility], Dr. George Ina Wears corrective lenses They have seen their  ophthalmologist in the last 12 months.    Dietary issues and exercise activities discussed: Current Exercise Habits: Home exercise routine, Type of exercise: walking, Time (Minutes): 50, Frequency (Times/Week): 7, Weekly Exercise (Minutes/Week): 350, Intensity: Mild   Goals Addressed               This Visit's Progress     Patient Stated     Maintain healthy lifestyle (pt-stated)        Weight loss goal 7-10lbs Healthy diet       Depression Screen    06/21/2022    3:35 PM 05/19/2022    3:37 PM 01/07/2022    9:26 AM 05/21/2021    9:07 AM 01/18/2020   11:32 AM 02/17/2018    9:40 AM 02/04/2017   10:47 AM  PHQ 2/9 Scores  PHQ - 2 Score 0 0 0 0 0 0 0  PHQ- 9 Score      4 1    Fall Risk    06/21/2022    3:34 PM 05/19/2022    3:37 PM 01/07/2022    9:26 AM 05/21/2021    9:07 AM 04/14/2020    2:38 PM  [place] in the past year?  1 0 1 0  Comment None since last reported      Number falls in past yr:  0 0 0   Injury with Fall?  1 0 0   Risk for fall due to :  History of fall(s) No Fall Risks    Follow up  Falls evaluation completed Falls evaluation completed Falls evaluation completed Falls evaluation completed    [place]: Home free of loose throw rugs in walkways, pet beds, electrical cords, etc? Yes  Adequate lighting in your home to reduce risk of falls? Yes   ASSISTIVE DEVICES UTILIZED TO PREVENT FALLS: Life alert? No  Use of a cane, walker or w/c? No  Grab bars in the bathroom?  No  Shower chair or bench in shower? No  Elevated toilet seat or a handicapped toilet? No   TIMED UP AND GO: Was the test performed? No .   Cognitive Function: Patient is alert and oriented x3.  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  Manages her own medications and finances.  Denies difficulty focusing, making decisions, memory loss.  Enjoys reading, crossword puzzles and other brain health stimulating activities.         06/21/2022    3:43 PM   6CIT Screen  Months in reverse 0 points    Immunizations Immunization History  Administered Date(s) Administered   Influenza-Unspecified 05/31/2017, 06/20/2018, 04/26/2019   MMR 04/13/2018, 05/11/2018   PFIZER(Purple Top)SARS-COV-2 Vaccination 12/14/2019, 01/09/2020, 07/03/2020, 01/27/2021   Tdap 09/17/2011, 10/13/2021   Zoster Recombinat (Shingrix) 03/23/2018, 05/25/2018   Covid-19 vaccine status: Completed vaccines x4.  Screening Tests Health Maintenance  Topic Date Due   COVID-19 Vaccine (5 - Pfizer risk series) 07/07/2022 (Originally 03/24/2021)   INFLUENZA VACCINE  12/12/2022 (Originally 04/13/2022)   MAMMOGRAM  08/17/2022   COLONOSCOPY (Pts 45-45yrs Insurance coverage will need to be confirmed)  04/30/2027   TETANUS/TDAP  10/14/2031   Hepatitis C Screening  Completed   HIV Screening  Completed   Zoster Vaccines- Shingrix  Completed   HPV VACCINES  Aged Out   Health Maintenance There are no preventive care reminders to display for this patient.  Lung Cancer Screening: (Low Dose CT Chest recommended if Age 56-80 years, 30 pack-year currently smoking OR have quit w/in 15years.) does not qualify.   Hepatitis C Screening: Completed 2016.  Vision Screening: Recommended annual ophthalmology exams for early detection of glaucoma and other disorders of the eye.  Dental Screening: Annual dental exams for proper oral hygiene.  Community Resource Referral / Chronic Care Management: CRR required this visit?  No   CCM required this visit?  No      Plan:     I have personally reviewed and noted the following in the patient's chart:   Medical and social history Use of alcohol, tobacco or illicit drugs  Current medications and supplements including opioid prescriptions. Patient is currently taking opioid prescriptions. Information provided to patient regarding non-opioid alternatives. Patient advised to discuss non-opioid treatment plan with their provider. Functional  ability and status Nutritional status Physical activity Advanced directives List of other physicians Hospitalizations, surgeries, and ER visits in previous 12 months Vitals Screenings to include cognitive, depression, and falls Referrals and appointments  In addition, I have reviewed and discussed with patient certain preventive protocols, quality metrics, and best practice recommendations. A written personalized care plan for preventive services as well as general preventive health recommendations were provided to patient.     OBrien-Blaney, Jayle Solarz L, LPN   58/04/5026    I have reviewed the above information and agree with above.   Deborra Medina, MD

## 2022-06-21 NOTE — Patient Instructions (Addendum)
Theresa [name] , Thank you for taking time to come for your Medicare Wellness Visit. I appreciate your ongoing commitment to your health goals. Please review the following plan we discussed and let me know if I can assist you in the future.   These are the goals we discussed:  Goals       Patient Stated     Maintain healthy lifestyle (pt-stated)      Weight loss goal 7-10lbs Healthy diet        This is a list of the screening recommended for you and due dates:  Health Maintenance  Topic Date Due   COVID-19 Vaccine (5 - Pfizer risk series) 07/07/2022*   Flu Shot  12/12/2022*   Mammogram  08/17/2022   Colon Cancer Screening  04/30/2027   Tetanus Vaccine  10/14/2031   Hepatitis C Screening: USPSTF Recommendation to screen - Ages 18-79 yo.  Completed   HIV Screening  Completed   Zoster (Shingles) Vaccine  Completed   HPV Vaccine  Aged Out  *Topic was postponed. The date shown is not the original due date.    Advanced directives: on file  Conditions/risks identified: none new  Next appointment: Follow up in one year for your annual wellness visit.   Preventive Care 40-64 Years, Female Preventive care refers to lifestyle choices and visits with your health care provider that can promote health and wellness. What does preventive care include? A yearly physical exam. This is also called an annual well check. Dental exams once or twice a year. Routine eye exams. Ask your health care provider how often you should have your eyes checked. Personal lifestyle choices, including: Daily care of your teeth and gums. Regular physical activity. Eating a healthy diet. Avoiding tobacco and drug use. Limiting alcohol use. Practicing safe sex. Taking low-dose aspirin daily starting at age 14. Taking vitamin and mineral supplements as recommended by your health care provider. What happens during an annual well check? The services and screenings done by your health care provider during your  annual well check will depend on your age, overall health, lifestyle risk factors, and family history of disease. Counseling  Your health care provider may ask you questions about your: Alcohol use. Tobacco use. Drug use. Emotional well-being. Home and relationship well-being. Sexual activity. Eating habits. Work and work Statistician. Method of birth control. Menstrual cycle. Pregnancy history. Screening  You may have the following tests or measurements: Height, weight, and BMI. Blood pressure. Lipid and cholesterol levels. These may be checked every 5 years, or more frequently if you are over 84 years old. Skin check. Lung cancer screening. You may have this screening every year starting at age 48 if you have a 30-pack-year history of smoking and currently smoke or have quit within the past 15 years. Fecal occult blood test (FOBT) of the stool. You may have this test every year starting at age 51. Flexible sigmoidoscopy or colonoscopy. You may have a sigmoidoscopy every 5 years or a colonoscopy every 10 years starting at age 51. Hepatitis C blood test. Hepatitis B blood test. Sexually transmitted disease (STD) testing. Diabetes screening. This is done by checking your blood sugar (glucose) after you have not eaten for a while (fasting). You may have this done every 1-3 years. Mammogram. This may be done every 1-2 years. Talk to your health care provider about when you should start having regular mammograms. This may depend on whether you have a family history of breast cancer. BRCA-related cancer screening. This  may be done if you have a family history of breast, ovarian, tubal, or peritoneal cancers. Pelvic exam and Pap test. This may be done every 3 years starting at age 79. Starting at age 23, this may be done every 5 years if you have a Pap test in combination with an HPV test. Bone density scan. This is done to screen for osteoporosis. You may have this scan if you are at high  risk for osteoporosis. Discuss your test results, treatment options, and if necessary, the need for more tests with your health care provider. Vaccines  Your health care provider may recommend certain vaccines, such as: Influenza vaccine. This is recommended every year. Tetanus, diphtheria, and acellular pertussis (Tdap, Td) vaccine. You may need a Td booster every 10 years. Zoster vaccine. You may need this after age 31. Pneumococcal 13-valent conjugate (PCV13) vaccine. You may need this if you have certain conditions and were not previously vaccinated. Pneumococcal polysaccharide (PPSV23) vaccine. You may need one or two doses if you smoke cigarettes or if you have certain conditions. Talk to your health care provider about which screenings and vaccines you need and how often you need them. This information is not intended to replace advice given to you by your health care provider. Make sure you discuss any questions you have with your health care provider. Document Released: 09/26/2015 Document Revised: 05/19/2016 Document Reviewed: 07/01/2015 Elsevier Interactive Patient Education  2017 [place] Prevention in the Home Falls can cause injuries. They can happen to people of all ages. There are many things you can do to make your home safe and to help prevent falls. What can I do on the outside of my home? Regularly fix the edges of walkways and driveways and fix any cracks. Remove anything that might make you trip as you walk through a door, such as a raised step or threshold. Trim any bushes or trees on the path to your home. Use bright outdoor lighting. Clear any walking paths of anything that might make someone trip, such as rocks or tools. Regularly check to see if handrails are loose or broken. Make sure that both sides of any steps have handrails. Any raised decks and porches should have guardrails on the edges. Have any leaves, snow, or ice cleared regularly. Use  sand or salt on walking paths during winter. Clean up any spills in your garage right away. This includes oil or grease spills. What can I do in the bathroom? Use night lights. Install grab bars by the toilet and in the tub and shower. Do not use towel bars as grab bars. Use non-skid mats or decals in the tub or shower. If you need to sit down in the shower, use a plastic, non-slip stool. Keep the floor dry. Clean up any water that spills on the floor as soon as it happens. Remove soap buildup in the tub or shower regularly. Attach bath mats securely with double-sided non-slip rug tape. Do not have throw rugs and other things on the floor that can make you trip. What can I do in the bedroom? Use night lights. Make sure that you have a light by your bed that is easy to reach. Do not use any sheets or blankets that are too big for your bed. They should not hang down onto the floor. Have a firm chair that has side arms. You can use this for support while you get dressed. Do not have throw rugs and other  things on the floor that can make you trip. What can I do in the kitchen? Clean up any spills right away. Avoid walking on wet floors. Keep items that you use a lot in easy-to-reach places. If you need to reach something above you, use a strong step stool that has a grab bar. Keep electrical cords out of the way. Do not use floor polish or wax that makes floors slippery. If you must use wax, use non-skid floor wax. Do not have throw rugs and other things on the floor that can make you trip. What can I do with my stairs? Do not leave any items on the stairs. Make sure that there are handrails on both sides of the stairs and use them. Fix handrails that are broken or loose. Make sure that handrails are as long as the stairways. Check any carpeting to make sure that it is firmly attached to the stairs. Fix any carpet that is loose or worn. Avoid having throw rugs at the top or bottom of the  stairs. If you do have throw rugs, attach them to the floor with carpet tape. Make sure that you have a light switch at the top of the stairs and the bottom of the stairs. If you do not have them, ask someone to add them for you. What else can I do to help prevent falls? Wear shoes that: Do not have high heels. Have rubber bottoms. Are comfortable and fit you well. Are closed at the toe. Do not wear sandals. If you use a stepladder: Make sure that it is fully opened. Do not climb a closed stepladder. Make sure that both sides of the stepladder are locked into place. Ask someone to hold it for you, if possible. Clearly mark and make sure that you can see: Any grab bars or handrails. First and last steps. Where the edge of each step is. Use tools that help you move around (mobility aids) if they are needed. These include: Canes. Walkers. Scooters. Crutches. Turn on the lights when you go into a dark area. Replace any light bulbs as soon as they burn out. Set up your furniture so you have a clear path. Avoid moving your furniture around. If any of your floors are uneven, fix them. If there are any pets around you, be aware of where they are. Review your medicines with your doctor. Some medicines can make you feel dizzy. This can increase your chance of falling. Ask your doctor what other things that you can do to help prevent falls. This information is not intended to replace advice given to you by your health care provider. Make sure you discuss any questions you have with your health care provider. Document Released: 06/26/2009 Document Revised: 02/05/2016 Document Reviewed: 10/04/2014 Elsevier Interactive Patient Education  2017 [employer].

## 2022-06-23 ENCOUNTER — Other Ambulatory Visit: Payer: Self-pay | Admitting: Internal Medicine

## 2022-06-28 ENCOUNTER — Ambulatory Visit (HOSPITAL_COMMUNITY): Payer: Medicare Other | Attending: Internal Medicine

## 2022-06-28 DIAGNOSIS — I359 Nonrheumatic aortic valve disorder, unspecified: Secondary | ICD-10-CM | POA: Diagnosis not present

## 2022-06-28 DIAGNOSIS — I351 Nonrheumatic aortic (valve) insufficiency: Secondary | ICD-10-CM | POA: Insufficient documentation

## 2022-06-28 LAB — ECHOCARDIOGRAM COMPLETE
Area-P 1/2: 3.17 cm2
P 1/2 time: 551 msec
S' Lateral: 3.4 cm

## 2022-07-07 ENCOUNTER — Encounter: Payer: Self-pay | Admitting: Internal Medicine

## 2022-07-15 ENCOUNTER — Encounter: Payer: Self-pay | Admitting: Internal Medicine

## 2022-07-19 ENCOUNTER — Telehealth: Payer: Self-pay | Admitting: Internal Medicine

## 2022-07-19 DIAGNOSIS — I359 Nonrheumatic aortic valve disorder, unspecified: Secondary | ICD-10-CM

## 2022-07-19 NOTE — Telephone Encounter (Signed)
Spoke to patient about echo findings again I have also spoken with Gilford Raid.  He has reviewed imagess  Would like to see patient in clinic       Will forward information to R Brooks to coordinate appt Please cancel the appt Ms Steury has with me at end of month  Wil reschedule

## 2022-07-19 NOTE — Telephone Encounter (Signed)
Referral placed to Dr Cyndia Bent.

## 2022-07-19 NOTE — Addendum Note (Signed)
Addended by: Stephani Police on: 07/19/2022 10:10 AM   Modules accepted: Orders

## 2022-07-21 ENCOUNTER — Other Ambulatory Visit (INDEPENDENT_AMBULATORY_CARE_PROVIDER_SITE_OTHER): Payer: Medicare Other

## 2022-07-21 DIAGNOSIS — Z79899 Other long term (current) drug therapy: Secondary | ICD-10-CM

## 2022-07-21 DIAGNOSIS — I10 Essential (primary) hypertension: Secondary | ICD-10-CM

## 2022-07-21 DIAGNOSIS — R7301 Impaired fasting glucose: Secondary | ICD-10-CM

## 2022-07-21 DIAGNOSIS — E785 Hyperlipidemia, unspecified: Secondary | ICD-10-CM

## 2022-07-21 DIAGNOSIS — E034 Atrophy of thyroid (acquired): Secondary | ICD-10-CM

## 2022-07-22 LAB — CBC WITH DIFFERENTIAL/PLATELET
Absolute Monocytes: 480 cells/uL (ref 200–950)
Basophils Absolute: 70 cells/uL (ref 0–200)
Basophils Relative: 1.4 %
Eosinophils Absolute: 170 cells/uL (ref 15–500)
Eosinophils Relative: 3.4 %
HCT: 41.5 % (ref 35.0–45.0)
Hemoglobin: 13.8 g/dL (ref 11.7–15.5)
Lymphs Abs: 1170 cells/uL (ref 850–3900)
MCH: 31.4 pg (ref 27.0–33.0)
MCHC: 33.3 g/dL (ref 32.0–36.0)
MCV: 94.5 fL (ref 80.0–100.0)
MPV: 10.6 fL (ref 7.5–12.5)
Monocytes Relative: 9.6 %
Neutro Abs: 3110 cells/uL (ref 1500–7800)
Neutrophils Relative %: 62.2 %
Platelets: 261 10*3/uL (ref 140–400)
RBC: 4.39 10*6/uL (ref 3.80–5.10)
RDW: 13.1 % (ref 11.0–15.0)
Total Lymphocyte: 23.4 %
WBC: 5 10*3/uL (ref 3.8–10.8)

## 2022-07-22 LAB — LIPID PANEL
Cholesterol: 212 mg/dL — ABNORMAL HIGH (ref <200)
HDL: 77 mg/dL (ref >=50)
LDL Cholesterol (Calc): 113 mg/dL (calc) — ABNORMAL HIGH
Non-HDL Cholesterol (Calc): 135 mg/dL (calc) — ABNORMAL HIGH (ref <130)
Total CHOL/HDL Ratio: 2.8 (calc) (ref <5.0)
Triglycerides: 112 mg/dL (ref <150)

## 2022-07-22 LAB — MICROALBUMIN / CREATININE URINE RATIO
Creatinine, Urine: 87 mg/dL (ref 20–275)
Microalb Creat Ratio: 7 mcg/mg creat (ref <30)
Microalb, Ur: 0.6 mg/dL

## 2022-07-22 LAB — COMPREHENSIVE METABOLIC PANEL
AG Ratio: 2 (calc) (ref 1.0–2.5)
ALT: 16 U/L (ref 6–29)
AST: 20 U/L (ref 10–35)
Albumin: 4.3 g/dL (ref 3.6–5.1)
Alkaline phosphatase (APISO): 66 U/L (ref 37–153)
BUN: 12 mg/dL (ref 7–25)
CO2: 29 mmol/L (ref 20–32)
Calcium: 9.5 mg/dL (ref 8.6–10.4)
Chloride: 103 mmol/L (ref 98–110)
Creat: 0.71 mg/dL (ref 0.50–1.05)
Globulin: 2.2 g/dL (calc) (ref 1.9–3.7)
Glucose, Bld: 94 mg/dL (ref 65–99)
Potassium: 4.4 mmol/L (ref 3.5–5.3)
Sodium: 141 mmol/L (ref 135–146)
Total Bilirubin: 0.6 mg/dL (ref 0.2–1.2)
Total Protein: 6.5 g/dL (ref 6.1–8.1)

## 2022-07-22 LAB — HEMOGLOBIN A1C
Hgb A1c MFr Bld: 5.6 % of total Hgb (ref <5.7)
Mean Plasma Glucose: 114 mg/dL
eAG (mmol/L): 6.3 mmol/L

## 2022-07-22 LAB — LDL CHOLESTEROL, DIRECT: Direct LDL: 133 mg/dL — ABNORMAL HIGH (ref <100)

## 2022-07-22 LAB — TSH: TSH: 2.98 mIU/L (ref 0.40–4.50)

## 2022-07-26 ENCOUNTER — Ambulatory Visit (INDEPENDENT_AMBULATORY_CARE_PROVIDER_SITE_OTHER): Payer: Medicare Other | Admitting: Internal Medicine

## 2022-07-26 ENCOUNTER — Encounter: Payer: Self-pay | Admitting: Internal Medicine

## 2022-07-26 VITALS — BP 160/80 | HR 70 | Temp 97.6°F | Ht 65.0 in | Wt 151.0 lb

## 2022-07-26 DIAGNOSIS — M87052 Idiopathic aseptic necrosis of left femur: Secondary | ICD-10-CM

## 2022-07-26 DIAGNOSIS — E785 Hyperlipidemia, unspecified: Secondary | ICD-10-CM | POA: Diagnosis not present

## 2022-07-26 DIAGNOSIS — Z79899 Other long term (current) drug therapy: Secondary | ICD-10-CM | POA: Diagnosis not present

## 2022-07-26 DIAGNOSIS — E034 Atrophy of thyroid (acquired): Secondary | ICD-10-CM | POA: Diagnosis not present

## 2022-07-26 DIAGNOSIS — I351 Nonrheumatic aortic (valve) insufficiency: Secondary | ICD-10-CM

## 2022-07-26 DIAGNOSIS — E010 Iodine-deficiency related diffuse (endemic) goiter: Secondary | ICD-10-CM

## 2022-07-26 MED ORDER — AMLODIPINE BESYLATE 2.5 MG PO TABS: 2.5000 mg | ORAL_TABLET | Freq: Every day | ORAL | 1 refills | Status: DC

## 2022-07-26 MED ORDER — ALPRAZOLAM ER 0.5 MG PO TB24: 0.5000 mg | ORAL_TABLET | Freq: Every evening | ORAL | 2 refills | Status: DC | PRN

## 2022-07-26 NOTE — Assessment & Plan Note (Signed)
Thyroid function is normal on current dose of levothyroxine, 88 mcg no changes today .  Reviewed prior thyroid ultrasound :  no nodules Lab Results  Component Value Date   TSH 2.98 07/21/2022

## 2022-07-26 NOTE — Assessment & Plan Note (Signed)
To see cardiothoracic surgeon this week

## 2022-07-26 NOTE — Assessment & Plan Note (Signed)
s/p left hip replacement in [place] 2023 by Dr Ernest Pine

## 2022-07-26 NOTE — Patient Instructions (Addendum)
I HAVE CHANGED YOUR ALPRAZOLAM TO THE "XR" VERSION TO HELP YOU STAY ASLEEP LONGER  TAKE IT 30 MINUTES PRIOR TO BEDTIME    LET ME KNOW IF YOU WOULD LIKE TO CONTINUE IT   Restarting amlodipine 2.5 mg daily   PLEASE CHECK BP AT HOME A FEW TIMES OVER THE NEXT MONTH AND SEND ME READINGS

## 2022-07-26 NOTE — Assessment & Plan Note (Signed)
No nodules by repeat ultrasound in August 2020

## 2022-07-26 NOTE — Progress Notes (Signed)
Patient ID: Theresa [name], female    DOB: 1955/11/03  Age: 66 y.o. MRN: [id number]  The patient is here for follow up and management of other chronic and acute problems.   The risk factors are reflected in the social history.  The roster of all physicians providing medical care to patient - is listed in the Snapshot section of the chart.  Activities of daily living:  The patient is 100% independent in all ADLs: dressing, toileting, feeding as well as independent mobility  Home safety : The patient has smoke detectors in the home. They wear seatbelts.  There are no firearms at home. There is no violence in the home.   There is no risks for hepatitis, STDs or HIV. There is no   history of blood transfusion. They have no travel history to infectious disease endemic areas of the world.  The patient has seen their dentist in the last six month. They have seen their eye doctor in the last year. They admit to slight hearing difficulty with regard to whispered voices and some television programs.  They have deferred audiologic testing in the last year.  They do not  have excessive sun exposure. Discussed the need for sun protection: hats, long sleeves and use of sunscreen if there is significant sun exposure.   Diet: the importance of a healthy diet is discussed. They do have a healthy diet.  The benefits of regular aerobic exercise were discussed. She walks 4 times per week ,  20 minutes.   Depression screen: there are no signs or vegative symptoms of depression- irritability, change in appetite, anhedonia, sadness/tearfullness.  Cognitive assessment: the patient manages all their financial and personal affairs and is actively engaged. They could relate day,date,year and events; recalled 2/3 objects at 3 minutes; performed clock-face test normally.  The following portions of the patient's history were reviewed and updated as appropriate: allergies, current medications, past family history, past  medical history,  past surgical history, past social history  and problem list.  Visual acuity was not assessed per patient preference since she has regular follow up with her ophthalmologist. Hearing and body mass index were assessed and reviewed.   During the course of the visit the patient was educated and counseled about appropriate screening and preventive services including : fall prevention , diabetes screening, nutrition counseling, colorectal cancer screening, and recommended immunizations.    CC: The primary encounter diagnosis was Hypothyroidism due to acquired atrophy of thyroid. Diagnoses of Hyperlipidemia, unspecified hyperlipidemia type, Long-term use of high-risk medication, Aortic valve insufficiency, etiology of cardiac valve disease unspecified, Thyromegaly, and Avascular necrosis of left femoral head (HCC) were also pertinent to this visit.  Recent COVID 19 infection in September, caused body aches and diarrhea.   Seeing Theresa [name] this week for consideration of surgery due to progression of AR from mild to moderate to severe in one year.  Has DOE walking up hills but otherwise asymptomatic   insomnia:  has reduced  wine to one glass at dinner.  Still having trouble sleeping.  Using alprazolam prn to initiate sleep but now also needs it at 2 am.  Tried melatonin  and relaxium .  HTN:  medication stopped due to hypotension, has not checked home reading in several weeks but was <130/80 at last check   [name] has a past medical history of Breast screening, unspecified, H/O: Bell's palsy, Headache, Heart murmur, History of kidney stones, Hypercalcemia, Osteoporosis, Screening for malignant neoplasm of the cervix, Special  screening for malignant neoplasms, colon, Unspecified hypothyroidism, and Unspecified otitis media.   She has a past surgical history that includes Meniscus repair (Right, 01/2010); Cholecystectomy (1989); dental implant; Anterior fusion cervical spine  (2013); Spine surgery; Anterior cervical decomp/discectomy fusion (13,15); Total abdominal hysterectomy; and Total hip arthroplasty (Left, 08/31/2021).   Her family history includes Alzheimer's disease in her mother; Breast cancer in her paternal aunt; Breast cancer (age of onset: 7) in her sister; Cancer (age of onset: 49) in her sister; Hyperlipidemia in her father; Thyroid nodules in her maternal grandmother, mother, and sister.She reports that she has never smoked. She has never used smokeless tobacco. She reports current alcohol use of about 5.0 standard drinks of alcohol per week. She reports that she does not use drugs.  Outpatient Medications Prior to Visit  Medication Sig Dispense Refill   cetirizine (ZYRTEC) 10 MG tablet Take 10 mg by mouth daily.     Cholecalciferol (VITAMIN D3) 1000 units CAPS Take 1,000 Units by mouth daily.      gabapentin (NEURONTIN) 100 MG capsule TAKE 2 CAPSULES BY MOUTH THREE TIMES DAILY 180 capsule 0   hydroxypropyl methylcellulose / hypromellose (ISOPTO TEARS / GONIOVISC) 2.5 % ophthalmic solution Place 1 drop into both eyes 2 (two) times daily as needed for dry eyes.     levothyroxine (SYNTHROID) 88 MCG tablet Take 1 tablet by mouth once daily 90 tablet 0   Magnesium 250 MG TABS Take 1 tablet by mouth daily.     Multiple Vitamin (MULTIVITAMIN) tablet Take 1 tablet by mouth daily.     Probiotic Product (PROBIOTIC PO) Take 1 capsule by mouth daily.     ALPRAZolam (XANAX) 0.25 MG tablet Take 1 tablet (0.25 mg total) by mouth at bedtime as needed. for anxiety 30 tablet 5   HYDROcodone-acetaminophen (NORCO/VICODIN) 5-325 MG tablet Take 1 tablet by mouth every 6 (six) hours as needed for moderate pain. (Patient not taking: Reported on 05/19/2022)     ondansetron (ZOFRAN) 4 MG tablet Take 1 tablet (4 mg total) by mouth every 8 (eight) hours as needed for nausea or vomiting. 20 tablet 0   predniSONE (DELTASONE) 10 MG tablet 6 tablets on Day 1 , then reduce by 1 tablet  daily until gone 21 tablet 0   No facility-administered medications prior to visit.    Review of Systems  Patient denies headache, fevers, malaise, unintentional weight loss, skin rash, eye pain, sinus congestion and sinus pain, sore throat, dysphagia,  hemoptysis , cough, dyspnea, wheezing, chest pain, palpitations, orthopnea, edema, abdominal pain, nausea, melena, diarrhea, constipation, flank pain, dysuria, hematuria, urinary  Frequency, nocturia, numbness, tingling, seizures,  Focal weakness, Loss of consciousness,  Tremor,, depression, anxiety, and suicidal ideation.     Objective:  BP (!) 160/80   Pulse 70   Temp 97.6 F (36.4 C) (Oral)   Ht 5\' 5"  (1.651 m)   Wt 151 lb (68.5 kg)   SpO2 98%   BMI 25.13 kg/m   Physical Exam  General appearance: alert, cooperative and appears stated age Head: Normocephalic, without obvious abnormality, atraumatic Eyes: conjunctivae/corneas clear. PERRL, EOM's intact. Fundi benign. Ears: normal TM's and external ear canals both ears Nose: Nares normal. Septum midline. Mucosa normal. No drainage or sinus tenderness. Throat: lips, mucosa, and tongue normal; teeth and gums normal Neck: no adenopathy, no carotid bruit, no JVD, supple, symmetrical, trachea midline and thyroid not enlarged, symmetric, no tenderness/mass/nodules Lungs: clear to auscultation bilaterally Breasts: normal appearance, no masses or tenderness Heart: regular  rate and rhythm, S1, S2 normal, grade 3 diastolic murmur loudest right sternal border Abdomen: soft, non-tender; bowel sounds normal; no masses,  no organomegaly Extremities: extremities normal, atraumatic, no cyanosis or edema Pulses: 2+ and symmetric Skin: Skin color, texture, turgor normal. No rashes or lesions Neurologic: Alert and oriented X 3, normal strength and tone. Normal symmetric reflexes. Normal coordination and gait.     Assessment & Plan:   Problem List Items Addressed This Visit     Aortic  regurgitation    To see cardiothoracic surgeon this week       Relevant Medications   amLODipine (NORVASC) 2.5 MG tablet   Avascular necrosis of left femoral head (HCC)    s/p left hip replacement in [place] 2023 by Dr Ernest Pine       Hypothyroidism - Primary    Thyroid function is normal on current dose of levothyroxine, 88 mcg no changes today .  Reviewed prior thyroid ultrasound :  no nodules Lab Results  Component Value Date   TSH 2.98 07/21/2022        Relevant Orders   TSH   Thyromegaly    No nodules by repeat ultrasound in August 2020      Other Visit Diagnoses     Hyperlipidemia, unspecified hyperlipidemia type       Relevant Medications   amLODipine (NORVASC) 2.5 MG tablet   Other Relevant Orders   Lipid panel   LDL cholesterol, direct   Long-term use of high-risk medication       Relevant Orders   Comprehensive metabolic panel       I provided 40 minutes of  face-to-face time during this encounter reviewing patient's current problems and past surgeries,  recent labs and imaging studies, providing counseling on the above mentioned problems , and coordination  of care .   Follow-up: Return in about 6 months (around 01/24/2023).   Sherlene Shams, MD

## 2022-07-29 ENCOUNTER — Encounter: Payer: Self-pay | Admitting: Surgery

## 2022-07-29 ENCOUNTER — Institutional Professional Consult (permissible substitution) (INDEPENDENT_AMBULATORY_CARE_PROVIDER_SITE_OTHER): Payer: Medicare Other | Admitting: Surgery

## 2022-07-29 ENCOUNTER — Encounter: Payer: Self-pay | Admitting: Internal Medicine

## 2022-07-29 VITALS — BP 145/69 | HR 94 | Resp 20 | Ht 67.0 in | Wt 150.0 lb

## 2022-07-29 DIAGNOSIS — I351 Nonrheumatic aortic (valve) insufficiency: Secondary | ICD-10-CM | POA: Diagnosis not present

## 2022-07-29 NOTE — Progress Notes (Signed)
Cardiothoracic Surgery Consultation  PCP is Crecencio Mc, MD Referring Provider is Fay Records, MD  Chief Complaint  Patient presents with   Aortic Insuffiency    Surgical consult, ECHO 06/28/22/     HPI:  The patient is a 66 year old woman with a long history of a heart murmur and a quadricuspid aortic valve diagnosed in 2018.  2D echocardiogram at that time showed moderate aortic insufficiency with a pressure half-time of 513 ms.  Left ventricular ejection fraction was 55 to 60% with an LV diastolic dimension of 5.1 cm.  Her aortic insufficiency was unchanged on echocardiogram in January 2019 and August [id number] but the diastolic dimension was decreased at 4.6 cm.  Her most recent echo on 06/28/2022 was felt to show moderate to severe aortic insufficiency with near holosystolic reversal in arch color flow although the AI pressure half-time was still 551 ms.  Left ventricular ejection fraction is unchanged at 55 to 60% and the LV diastolic diameter was 4.9 cm.  She is here today with her husband.  They are very active walking daily and she has not noticed any shortness of breath or fatigue.  She denies any peripheral edema.  She has had no orthopnea or PND.  She denies any chest pain or pressure.  Her main problems have been orthopedic with multiple spine surgeries, left total hip replacement, and a right knee meniscus repair. Past Medical History:  Diagnosis Date   Breast screening, unspecified    H/O: Bell's palsy    recurrent   Headache    Heart murmur    History of kidney stones    Hypercalcemia    Osteoporosis    Screening for malignant neoplasm of the cervix    Special screening for malignant neoplasms, colon    Unspecified hypothyroidism    thyroid nodule   Unspecified otitis media     Past Surgical History:  Procedure Laterality Date   ANTERIOR CERVICAL DECOMP/DISCECTOMY FUSION  13,15   C3 ,4 ,5 [place]   ANTERIOR FUSION CERVICAL SPINE  2013   [place]  c5-c6    CHOLECYSTECTOMY  1989   dental implant     MENISCUS REPAIR Right 01/2010   [facility]   SPINE SURGERY     [place], C3, 4 5   TOTAL ABDOMINAL HYSTERECTOMY     secondary to fibroids still has ovaries   TOTAL HIP ARTHROPLASTY Left 08/31/2021   Procedure: TOTAL HIP ARTHROPLASTY;  Surgeon: Dereck Leep, MD;  Location: [facility] ORS;  Service: Orthopedics;  Laterality: Left;    Family History  Problem Relation Age of Onset   Thyroid nodules Mother    Alzheimer's disease Mother    Hyperlipidemia Father    Breast cancer Paternal Aunt    Thyroid nodules Sister    Cancer Sister 34       in situ   Breast cancer Sister 52   Thyroid nodules Maternal Grandmother     Social History Social History   Tobacco Use   Smoking status: Never   Smokeless tobacco: Never  Vaping Use   Vaping Use: Never used  Substance Use Topics   Alcohol use: Yes    Alcohol/week: 5.0 standard drinks of alcohol    Types: 5 Standard drinks or equivalent per week    Comment: Occasional   Drug use: No    Current Outpatient Medications  Medication Sig Dispense Refill   ALPRAZolam (XANAX XR) 0.5 MG 24 hr tablet Take 1 tablet (0.5 mg total) by mouth  at bedtime as needed for anxiety. 30 tablet 2   amLODipine (NORVASC) 2.5 MG tablet Take 1 tablet (2.5 mg total) by mouth daily. 90 tablet 1   cetirizine (ZYRTEC) 10 MG tablet Take 10 mg by mouth daily.     Cholecalciferol (VITAMIN D3) 1000 units CAPS Take 1,000 Units by mouth daily.      gabapentin (NEURONTIN) 100 MG capsule TAKE 2 CAPSULES BY MOUTH THREE TIMES DAILY 180 capsule 0   hydroxypropyl methylcellulose / hypromellose (ISOPTO TEARS / GONIOVISC) 2.5 % ophthalmic solution Place 1 drop into both eyes 2 (two) times daily as needed for dry eyes.     levothyroxine (SYNTHROID) 88 MCG tablet Take 1 tablet by mouth once daily 90 tablet 0   Magnesium 250 MG TABS Take 1 tablet by mouth daily.     Multiple Vitamin (MULTIVITAMIN) tablet Take 1 tablet by mouth daily.      Probiotic Product (PROBIOTIC PO) Take 1 capsule by mouth daily.     No current facility-administered medications for this visit.    Allergies  Allergen Reactions   Meloxicam Other (See Comments)    Dizziness   Topiramate Other (See Comments)    Caused hair loss   Tramadol Itching and Other (See Comments)    Recently developed    Review of Systems  Constitutional:  Negative for fatigue.  HENT: Negative.    Eyes: Negative.   Respiratory:  Negative for shortness of breath.   Cardiovascular:  Positive for palpitations. Negative for chest pain and leg swelling.  Gastrointestinal:  Positive for diarrhea.  Endocrine: Negative.   Genitourinary: Negative.   Musculoskeletal:  Positive for arthralgias and joint swelling.  Skin: Negative.   Allergic/Immunologic: Negative.   Neurological:  Positive for headaches. Negative for dizziness and syncope.  Hematological: Negative.   Psychiatric/Behavioral:  Positive for sleep disturbance. The patient is nervous/anxious.     BP (!) 145/69 (BP Location: Left Arm, Patient Position: Sitting, Cuff Size: Normal)   Pulse 94   Resp 20   Ht 5\' 7"  (1.702 m)   Wt 150 lb (68 kg)   SpO2 96%   BMI 23.49 kg/m  Physical Exam Constitutional:      Appearance: Normal appearance.  Eyes:     Extraocular Movements: Extraocular movements intact.     Conjunctiva/sclera: Conjunctivae normal.     Pupils: Pupils are equal, round, and reactive to light.  Neck:     Vascular: No carotid bruit.  Cardiovascular:     Rate and Rhythm: Normal rate and regular rhythm.     Pulses: Normal pulses.     Heart sounds: Murmur heard.     Comments: 2/6 systolic murmur RSB, 1/6 diastolic murmur LLSB Pulmonary:     Effort: Pulmonary effort is normal.     Breath sounds: Normal breath sounds.  Musculoskeletal:        General: No swelling.  Skin:    General: Skin is warm and dry.  Neurological:     General: No focal deficit present.     Mental Status: She is alert and  oriented to person, place, and time.  Psychiatric:        Mood and Affect: Mood normal.        Behavior: Behavior normal.      Diagnostic Tests:      ECHOCARDIOGRAM REPORT       Patient Name:   Theresa [name] Date of Exam: 06/28/2022  Medical Rec #:  [id number]  Height:       65.0 in  Accession #:    [id number]          Weight:       145.0 lb  Date of Birth:  02-26-1956           BSA:          1.725 m  Patient Age:    49 years            BP:           132/86 mmHg  Patient Gender: F                   HR:           72 bpm.  Exam Location:  [place]   Procedure: 2D Echo, Cardiac Doppler, Color Doppler and Strain Analysis   Indications:    I35.1 Aortic Insufficiency    History:        Patient has prior history of Echocardiogram examinations,  most                 recent 05/14/2021. Signs/Symptoms:Murmur.    Sonographer:    Cresenciano Lick RDCS  Referring Phys: [id number] PAULA V ROSS   IMPRESSIONS     1. Left ventricular ejection fraction, by estimation, is 55 to 60%. The  left ventricle has normal function. The left ventricle has no regional  wall motion abnormalities. The left ventricular internal cavity size was  mildly dilated. Left ventricular  diastolic parameters were normal. The average left ventricular global  longitudinal strain is -22.3 %. The global longitudinal strain is normal.   2. Right ventricular systolic function is normal. The right ventricular  size is normal.   3. Left atrial size was mildly dilated.   4. The mitral valve is abnormal. Mild mitral valve regurgitation. No  evidence of mitral stenosis.   5. 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 PT1/2 not very short. Near  holosystolic reversal in arch Color flow > 50% of the LvOT diameter Images  on current study better than 05/14/21 but overall degree of AR looks worse .  The aortic valve is abnormal.   Aortic valve regurgitation is not visualized. No aortic stenosis is   present.   6. The inferior vena cava is normal in size with greater than 50%  respiratory variability, suggesting right atrial pressure of 3 mmHg.   FINDINGS   Left Ventricle: Left ventricular ejection fraction, by estimation, is 55  to 60%. The left ventricle has normal function. The left ventricle has no  regional wall motion abnormalities. The average left ventricular global  longitudinal strain is -22.3 %.  The global longitudinal strain is normal. The left ventricular internal  cavity size was mildly dilated. There is no left ventricular hypertrophy.  Left ventricular diastolic parameters were normal.   Right Ventricle: The right ventricular size is normal. No increase in  right ventricular wall thickness. Right ventricular systolic function is  normal.   Left Atrium: Left atrial size was mildly dilated.   Right Atrium: Right atrial size was normal in size.   Pericardium: There is no evidence of pericardial effusion.   Mitral Valve: The mitral valve is abnormal. There is moderate thickening  of the mitral valve leaflet(s). Mild mitral valve regurgitation. No  evidence of mitral valve stenosis.   Tricuspid Valve: The tricuspid valve is normal in structure. Tricuspid  valve regurgitation is trivial. No evidence of tricuspid stenosis.   Aortic  Valve: Quadracuspid AV with moderate to severe AR. PT1/2 not very  short. Near holosystolic reversal in arch Color flow > 50% of the LvOT  diameter Images on current study better than 05/14/21 but overall degree of  AR looks worse. The aortic valve is  abnormal. Aortic valve regurgitation is not visualized. Aortic  regurgitation PHT measures 551 msec. No aortic stenosis is present.   Pulmonic Valve: The pulmonic valve was normal in structure. Pulmonic valve  regurgitation is not visualized. No evidence of pulmonic stenosis.   Aorta: The aortic root is normal in size and structure.   Venous: The inferior vena cava is normal in size  with greater than 50%  respiratory variability, suggesting right atrial pressure of 3 mmHg.   IAS/Shunts: No atrial level shunt detected by color flow Doppler.     LEFT VENTRICLE  PLAX 2D  LVIDd:         4.90 cm   Diastology  LVIDs:         3.40 cm   LV e' medial:    7.84 cm/s  LV PW:         0.80 cm   LV E/e' medial:  7.3  LV IVS:        0.70 cm   LV e' lateral:   9.03 cm/s  LVOT diam:     2.00 cm   LV E/e' lateral: 6.3  LV SV:         80  LV SV Index:   47        2D Longitudinal Strain  LVOT Area:     3.14 cm  2D Strain GLS (A2C):   -22.4 %                           2D Strain GLS (A3C):   -22.0 %                           2D Strain GLS (A4C):   -22.5 %                           2D Strain GLS Avg:     -22.3 %   RIGHT VENTRICLE             IVC  RV Basal diam:  3.40 cm     IVC diam: 1.60 cm  RV S prime:     14.70 cm/s  TAPSE (M-mode): 2.8 cm   LEFT ATRIUM             Index        RIGHT ATRIUM          Index  LA diam:        4.10 cm 2.38 cm/m   RA Area:     9.29 cm  LA Vol (A2C):   52.7 ml 30.54 ml/m  RA Volume:   19.50 ml 11.30 ml/m  LA Vol (A4C):   41.2 ml 23.88 ml/m  LA Biplane Vol: 49.7 ml 28.80 ml/m   AORTIC VALVE  LVOT Vmax:   108.50 cm/s  LVOT Vmean:  68.650 cm/s  LVOT VTI:    0.256 m  AI PHT:      551 msec    AORTA  Ao Root diam: 3.30 cm  Ao Asc diam:  3.60 cm   MITRAL VALVE  MV Area (PHT): 3.17 cm  SHUNTS  MV Decel Time: 239 msec    Systemic VTI:  0.26 m  MV E velocity: 57.10 cm/s  Systemic Diam: 2.00 cm  MV A velocity: 65.00 cm/s  MV E/A ratio:  0.88   Theresa Haws MD  Electronically signed by Theresa Haws MD  Signature Date/Time: 06/28/2022/1:06:26 PM        Final      Impression:  This 66 year old woman has stage C, asymptomatic, 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  I have personally reviewed her 2D echocardiogram and reviewed the images with her and her husband.  Her valve leaflets are thickened with  lack of central coaptation and central regurgitation.  Her left ventricular systolic function is normal with mild LV dilation at 4.9 cm which has not significantly changed over the past several years.  Her aortic insufficiency visually looks worse than it did on her echo 1 year ago although the pressure half-time is unchanged.  There is no absolute indication to proceed with aortic valve replacement at this time since she is asymptomatic with a normal ejection fraction and end-diastolic diameter of 4.9 cm.  I think she could proceed with close follow-up with an echocardiogram in 6 months and if there is any increase in her LV diastolic dimension or decrease in her ejection fraction then surgery would be indicated.  If she develops any symptoms then she should proceed with surgery.  I reviewed the symptoms of aortic insufficiency with her and since she is fairly active walking a good distance every day she should notice if there is any change.  Sometimes patients remain well compensated with severe aortic insufficiency and asymptomatic until their left ventricular function deteriorates so she will require close follow-up.  I think if she is worrying about this too much and wants to proceed ahead with aortic valve replacement that would be a reasonable indication also. It is going to need to be replaced in the not to distant future.   Plan:  She will continue to follow-up with Dr. Tenny Craw with an echocardiogram in 6 months and Dr. Darrick Huntsman.  She will contact me if she develops any symptoms or has any questions about aortic valve replacement surgery.  I spent 60 minutes performing this consultation and > 50% of this time was spent face to face counseling and coordinating the care of this patient's moderate to severe asymptomatic aortic insufficiency.   Alleen Borne, MD [facility] [phone]

## 2022-07-30 NOTE — Telephone Encounter (Signed)
Did you want her to do a 6 month follow up or a yearly follow up?

## 2022-08-11 ENCOUNTER — Ambulatory Visit: Payer: Medicare Other | Admitting: Internal Medicine

## 2022-08-18 ENCOUNTER — Ambulatory Visit
Admission: RE | Admit: 2022-08-18 | Discharge: 2022-08-18 | Disposition: A | Discharge status: Home / Self Care | Payer: Medicare Other | Source: Ambulatory Visit | Attending: Internal Medicine | Admitting: Internal Medicine

## 2022-08-18 DIAGNOSIS — Z1231 Encounter for screening mammogram for malignant neoplasm of breast: Secondary | ICD-10-CM | POA: Diagnosis present

## 2022-08-20 ENCOUNTER — Ambulatory Visit: Payer: Medicare Other | Admitting: Nurse Practitioner

## 2022-08-20 ENCOUNTER — Other Ambulatory Visit: Payer: Self-pay | Admitting: Family

## 2022-08-20 DIAGNOSIS — M792 Neuralgia and neuritis, unspecified: Secondary | ICD-10-CM

## 2022-08-20 DIAGNOSIS — B029 Zoster without complications: Secondary | ICD-10-CM

## 2022-08-23 ENCOUNTER — Telehealth: Payer: Self-pay

## 2022-08-23 ENCOUNTER — Encounter: Payer: Self-pay | Admitting: *Deleted

## 2022-08-23 ENCOUNTER — Other Ambulatory Visit: Payer: Self-pay | Admitting: *Deleted

## 2022-08-23 DIAGNOSIS — Z5181 Encounter for therapeutic drug level monitoring: Secondary | ICD-10-CM

## 2022-08-23 DIAGNOSIS — I351 Nonrheumatic aortic (valve) insufficiency: Secondary | ICD-10-CM

## 2022-08-23 DIAGNOSIS — R7989 Other specified abnormal findings of blood chemistry: Secondary | ICD-10-CM

## 2022-08-23 NOTE — Telephone Encounter (Signed)
This patient called me to schedule her AVR w/ Dr. Laneta Simmers on Friday 1/12. Can you help get a heart cath scheduled prior to this? Patients only request is that the case is mid-day please. RB We would also like it to be either [facility] or [facility] to cath her please.     Pt to have Right and left Heart Cath with Dr Excell Seltzer on 09/01/22 at 1:00 pm with 11 am arrival.   Pt verbalized understanding of her instructions and copy sent to her My Chart for her review.   Labs done 08/23/22 with Dr Laneta Simmers.

## 2022-08-25 ENCOUNTER — Encounter: Payer: Self-pay | Admitting: Internal Medicine

## 2022-08-25 ENCOUNTER — Other Ambulatory Visit: Payer: Self-pay | Admitting: Internal Medicine

## 2022-08-25 DIAGNOSIS — B029 Zoster without complications: Secondary | ICD-10-CM

## 2022-08-25 DIAGNOSIS — M792 Neuralgia and neuritis, unspecified: Secondary | ICD-10-CM

## 2022-08-25 NOTE — Telephone Encounter (Signed)
Pt to have appt this week with Robin Searing NP for [facility]  workup.

## 2022-08-26 MED ORDER — GABAPENTIN 100 MG PO CAPS: 200.0000 mg | ORAL_CAPSULE | Freq: Three times a day (TID) | ORAL | 1 refills | Status: DC

## 2022-08-26 NOTE — Progress Notes (Signed)
Office Visit    Patient Name: Theresa [name] Date of Encounter: 08/26/2022  Primary Care Provider:  Sherlene Shams, MD Primary Cardiologist:  None Primary Electrophysiologist: None  Chief Complaint    Theresa [name] is a 66 y.o. female with PMH of quadricuspid aortic valve disease, heart murmur, osteoporosis, kidney stones who present for pre cath work up.  Past Medical History    Past Medical History:  Diagnosis Date   Breast screening, unspecified    H/O: Bell's palsy    recurrent   Headache    Heart murmur    History of kidney stones    Hypercalcemia    Osteoporosis    Screening for malignant neoplasm of the cervix    Special screening for malignant neoplasms, colon    Unspecified hypothyroidism    thyroid nodule   Unspecified otitis media    Past Surgical History:  Procedure Laterality Date   ANTERIOR CERVICAL DECOMP/DISCECTOMY FUSION  13,15   C3 ,4 ,5 [facility]   ANTERIOR FUSION CERVICAL SPINE  2013   [facility]  c5-c6   CHOLECYSTECTOMY  1989   dental implant     MENISCUS REPAIR Right 01/2010   [facility]   SPINE SURGERY     [facility], C3, 4 5   TOTAL ABDOMINAL HYSTERECTOMY     secondary to fibroids still has ovaries   TOTAL HIP ARTHROPLASTY Left 08/31/2021   Procedure: TOTAL HIP ARTHROPLASTY;  Surgeon: Donato Heinz, MD;  Location: [facility] ORS;  Service: Orthopedics;  Laterality: Left;    Allergies  Allergies  Allergen Reactions   Meloxicam Other (See Comments)    Dizziness   Topiramate Other (See Comments)    Caused hair loss   Tramadol Itching and Other (See Comments)    Recently developed    History of Present Illness    Theresa [name]  is a 66 year old female with the above mention past medical history who presents today for preleft heart catheterization visit.  Theresa [name] has been followed by Dr. Tenny Craw since 2018 when she was seen for management of aortic insufficiency and shortness of breath and fatigue.  2D echo was completed with an EF  of 55-60%, left ventricular end-diastolic pressure was 51 mm with moderate aortic insufficiency.  She continues to do well with no palpitations or chest pain with 2D echo was revealing moderate AI.  Most recent 2D echo was completed 06/2022 with EF of 55-60% and moderate to severe AR.  She was sent for surgical consultation and was seen by Dr. Laneta Simmers on 07/29/2022.  The determination was made following review of her most recent echo to proceed with surgical intervention and aortic valve replacement.  Please call contacted our office and is scheduled to undergo AVR replacement on 09/24/2022.  She requires R and/LHC that will be performed by Dr. Excell Seltzer on 09/01/2022  Theresa [name] presents today for discussion of right and left heart catheterization alone.  Since last being seen in the office patient reports she has been doing well with no new cardiac complaints.  She is in the process of being worked up for AVR surgery that will be performed by Dr. Laneta Simmers next month.  Today on examination she was euvolemic with no complaint of shortness of breath or tachycardia.  Her blood pressure was elevated at 144/78 and patient's heart rate was 70 bpm.  She reports that her PCP advised her not to take her amlodipine and to allow her blood pressure to remain in  the 140 systolic.  She is overall doing well with no other complaints at this time.  Patient denies chest pain, palpitations, dyspnea, PND, orthopnea, nausea, vomiting, dizziness, syncope, edema, weight gain, or early satiety.   Home Medications    Current Outpatient Medications  Medication Sig Dispense Refill   ALPRAZolam (XANAX XR) 0.5 MG 24 hr tablet Take 1 tablet (0.5 mg total) by mouth at bedtime as needed for anxiety. 30 tablet 2   amLODipine (NORVASC) 2.5 MG tablet Take 1 tablet (2.5 mg total) by mouth daily. 90 tablet 1   cetirizine (ZYRTEC) 10 MG tablet Take 10 mg by mouth daily.     Cholecalciferol (VITAMIN D3) 1000 units CAPS Take 1,000 Units by mouth  daily.      gabapentin (NEURONTIN) 100 MG capsule Take 2 capsules (200 mg total) by mouth 3 (three) times daily. 180 capsule 1   hydroxypropyl methylcellulose / hypromellose (ISOPTO TEARS / GONIOVISC) 2.5 % ophthalmic solution Place 1 drop into both eyes 2 (two) times daily as needed for dry eyes.     levothyroxine (SYNTHROID) 88 MCG tablet Take 1 tablet by mouth once daily 90 tablet 0   Magnesium 250 MG TABS Take 1 tablet by mouth daily.     Multiple Vitamin (MULTIVITAMIN) tablet Take 1 tablet by mouth daily.     Probiotic Product (PROBIOTIC PO) Take 1 capsule by mouth daily.     No current facility-administered medications for this visit.     Review of Systems  Please see the history of present illness.    (+) Fatigue and anxiety  All other systems reviewed and are otherwise negative except as noted above.  Physical Exam    Wt Readings from Last 3 Encounters:  07/29/22 150 lb (68 kg)  07/26/22 151 lb (68.5 kg)  06/21/22 145 lb (65.8 kg)   UM:PNTIR were no vitals filed for this visit.,There is no height or weight on file to calculate BMI.  Constitutional:      Appearance: Healthy appearance. Not in distress.  Neck:     Vascular: JVD normal.  Pulmonary:     Effort: Pulmonary effort is normal.     Breath sounds: No wheezing. No rales. Diminished in the bases Cardiovascular:     Normal rate. Regular rhythm. Normal S1. Normal S2.      Murmurs: 2/6 systolic murmur at RUSB Edema:    Peripheral edema absent.  Abdominal:     Palpations: Abdomen is soft non tender. There is no hepatomegaly.  Skin:    General: Skin is warm and dry.  Neurological:     General: No focal deficit present.     Mental Status: Alert and oriented to person, place and time.     Cranial Nerves: Cranial nerves are intact.  EKG/LABS/Other Studies Reviewed    ECG personally reviewed by me today -sinus rhythm with occasional PVCs and possible left atrial enlargement with left axis deviation and LVH with rate  of 78 bpm and no acute changes consistent with previous EKGs.  Lab Results  Component Value Date   WBC 5.0 07/21/2022   HGB 13.8 07/21/2022   HCT 41.5 07/21/2022   MCV 94.5 07/21/2022   PLT 261 07/21/2022   Lab Results  Component Value Date   CREATININE 0.71 07/21/2022   BUN 12 07/21/2022   NA 141 07/21/2022   K 4.4 07/21/2022   CL 103 07/21/2022   CO2 29 07/21/2022   Lab Results  Component Value Date   ALT 16  07/21/2022   AST 20 07/21/2022   ALKPHOS 75 08/17/2021   BILITOT 0.6 07/21/2022   Lab Results  Component Value Date   CHOL 212 (H) 07/21/2022   HDL 77 07/21/2022   LDLCALC 113 (H) 07/21/2022   LDLDIRECT 133 (H) 07/21/2022   TRIG 112 07/21/2022   CHOLHDL 2.8 07/21/2022    Lab Results  Component Value Date   HGBA1C 5.6 07/21/2022    Assessment & Plan    1.  Severe aortic insufficiency: -Patient has history of quadricuspid AVR and has developed progressive aortic insufficiency requiring replacement.  She presents today for evaluation prior to right and left heart catheterization. -She reports no chest pain or shortness of breath during visit and is doing well with exception of fatigue that is related to her aortic insufficiency  2.Hyperlipidemia: -Patient's most recent LDL was 133 1 month ago -Currently being followed by PCP  Disposition: Follow-up with None or APP in 3 months  Shared Decision Making/Informed Consent The risks [stroke (1 in 1000), death (1 in 1000), kidney failure [usually temporary] (1 in 500), bleeding (1 in 200), allergic reaction [possibly serious] (1 in 200)], benefits (diagnostic support and management of coronary artery disease) and alternatives of a cardiac catheterization were discussed in detail with Theresa [name] and she is willing to proceed.   Medication Adjustments/Labs and Tests Ordered: Current medicines are reviewed at length with the patient today.  Concerns regarding medicines are outlined above.   Signed, Napoleon Form, Leodis Rains, NP 08/26/2022, 8:29 PM [facility]  Note:  This document was prepared using Dragon voice recognition software and may include unintentional dictation errors.

## 2022-08-26 NOTE — H&P (View-Only) (Signed)
Office Visit    Patient Name: Theresa [name] Date of Encounter: 08/26/2022  Primary Care Provider:  Sherlene Shams, MD Primary Cardiologist:  None Primary Electrophysiologist: None  Chief Complaint    Theresa [name] is a 66 y.o. female with PMH of quadricuspid aortic valve disease, heart murmur, osteoporosis, kidney stones who present for pre cath work up.  Past Medical History    Past Medical History:  Diagnosis Date   Breast screening, unspecified    H/O: Bell's palsy    recurrent   Headache    Heart murmur    History of kidney stones    Hypercalcemia    Osteoporosis    Screening for malignant neoplasm of the cervix    Special screening for malignant neoplasms, colon    Unspecified hypothyroidism    thyroid nodule   Unspecified otitis media    Past Surgical History:  Procedure Laterality Date   ANTERIOR CERVICAL DECOMP/DISCECTOMY FUSION  13,15   C3 ,4 ,5 [facility]   ANTERIOR FUSION CERVICAL SPINE  2013   [facility]  c5-c6   CHOLECYSTECTOMY  1989   dental implant     MENISCUS REPAIR Right 01/2010   [facility]   SPINE SURGERY     [facility], C3, 4 5   TOTAL ABDOMINAL HYSTERECTOMY     secondary to fibroids still has ovaries   TOTAL HIP ARTHROPLASTY Left 08/31/2021   Procedure: TOTAL HIP ARTHROPLASTY;  Surgeon: Donato Heinz, MD;  Location: [facility] ORS;  Service: Orthopedics;  Laterality: Left;    Allergies  Allergies  Allergen Reactions   Meloxicam Other (See Comments)    Dizziness   Topiramate Other (See Comments)    Caused hair loss   Tramadol Itching and Other (See Comments)    Recently developed    History of Present Illness    Theresa [name]  is a 66 year old female with the above mention past medical history who presents today for preleft heart catheterization visit.  Theresa [name] has been followed by Dr. Tenny Craw since 2018 when she was seen for management of aortic insufficiency and shortness of breath and fatigue.  2D echo was completed with an EF  of 55-60%, left ventricular end-diastolic pressure was 51 mm with moderate aortic insufficiency.  She continues to do well with no palpitations or chest pain with 2D echo was revealing moderate AI.  Most recent 2D echo was completed 06/2022 with EF of 55-60% and moderate to severe AR.  She was sent for surgical consultation and was seen by Dr. Laneta Simmers on 07/29/2022.  The determination was made following review of her most recent echo to proceed with surgical intervention and aortic valve replacement.  Please call contacted our office and is scheduled to undergo AVR replacement on 09/24/2022.  She requires R and/LHC that will be performed by Dr. Excell Seltzer on 09/01/2022  Ms. Faraj presents today for discussion of right and left heart catheterization alone.  Since last being seen in the office patient reports she has been doing well with no new cardiac complaints.  She is in the process of being worked up for AVR surgery that will be performed by Dr. Laneta Simmers next month.  Today on examination she was euvolemic with no complaint of shortness of breath or tachycardia.  Her blood pressure was elevated at 144/78 and patient's heart rate was 70 bpm.  She reports that her PCP advised her not to take her amlodipine and to allow her blood pressure to remain in  the 140 systolic.  She is overall doing well with no other complaints at this time.  Patient denies chest pain, palpitations, dyspnea, PND, orthopnea, nausea, vomiting, dizziness, syncope, edema, weight gain, or early satiety.   Home Medications    Current Outpatient Medications  Medication Sig Dispense Refill   ALPRAZolam (XANAX XR) 0.5 MG 24 hr tablet Take 1 tablet (0.5 mg total) by mouth at bedtime as needed for anxiety. 30 tablet 2   amLODipine (NORVASC) 2.5 MG tablet Take 1 tablet (2.5 mg total) by mouth daily. 90 tablet 1   cetirizine (ZYRTEC) 10 MG tablet Take 10 mg by mouth daily.     Cholecalciferol (VITAMIN D3) 1000 units CAPS Take 1,000 Units by mouth  daily.      gabapentin (NEURONTIN) 100 MG capsule Take 2 capsules (200 mg total) by mouth 3 (three) times daily. 180 capsule 1   hydroxypropyl methylcellulose / hypromellose (ISOPTO TEARS / GONIOVISC) 2.5 % ophthalmic solution Place 1 drop into both eyes 2 (two) times daily as needed for dry eyes.     levothyroxine (SYNTHROID) 88 MCG tablet Take 1 tablet by mouth once daily 90 tablet 0   Magnesium 250 MG TABS Take 1 tablet by mouth daily.     Multiple Vitamin (MULTIVITAMIN) tablet Take 1 tablet by mouth daily.     Probiotic Product (PROBIOTIC PO) Take 1 capsule by mouth daily.     No current facility-administered medications for this visit.     Review of Systems  Please see the history of present illness.    (+) Fatigue and anxiety  All other systems reviewed and are otherwise negative except as noted above.  Physical Exam    Wt Readings from Last 3 Encounters:  07/29/22 150 lb (68 kg)  07/26/22 151 lb (68.5 kg)  06/21/22 145 lb (65.8 kg)   VS:There were no vitals filed for this visit.,There is no height or weight on file to calculate BMI.  Constitutional:      Appearance: Healthy appearance. Not in distress.  Neck:     Vascular: JVD normal.  Pulmonary:     Effort: Pulmonary effort is normal.     Breath sounds: No wheezing. No rales. Diminished in the bases Cardiovascular:     Normal rate. Regular rhythm. Normal S1. Normal S2.      Murmurs: 2/6 systolic murmur at RUSB Edema:    Peripheral edema absent.  Abdominal:     Palpations: Abdomen is soft non tender. There is no hepatomegaly.  Skin:    General: Skin is warm and dry.  Neurological:     General: No focal deficit present.     Mental Status: Alert and oriented to person, place and time.     Cranial Nerves: Cranial nerves are intact.  EKG/LABS/Other Studies Reviewed    ECG personally reviewed by me today -sinus rhythm with occasional PVCs and possible left atrial enlargement with left axis deviation and LVH with rate  of 78 bpm and no acute changes consistent with previous EKGs.  Lab Results  Component Value Date   WBC 5.0 07/21/2022   HGB 13.8 07/21/2022   HCT 41.5 07/21/2022   MCV 94.5 07/21/2022   PLT 261 07/21/2022   Lab Results  Component Value Date   CREATININE 0.71 07/21/2022   BUN 12 07/21/2022   NA 141 07/21/2022   K 4.4 07/21/2022   CL 103 07/21/2022   CO2 29 07/21/2022   Lab Results  Component Value Date   ALT 16   07/21/2022   AST 20 07/21/2022   ALKPHOS 75 08/17/2021   BILITOT 0.6 07/21/2022   Lab Results  Component Value Date   CHOL 212 (H) 07/21/2022   HDL 77 07/21/2022   LDLCALC 113 (H) 07/21/2022   LDLDIRECT 133 (H) 07/21/2022   TRIG 112 07/21/2022   CHOLHDL 2.8 07/21/2022    Lab Results  Component Value Date   HGBA1C 5.6 07/21/2022    Assessment & Plan    1.  Severe aortic insufficiency: -Patient has history of quadricuspid AVR and has developed progressive aortic insufficiency requiring replacement.  She presents today for evaluation prior to right and left heart catheterization. -She reports no chest pain or shortness of breath during visit and is doing well with exception of fatigue that is related to her aortic insufficiency  2.Hyperlipidemia: -Patient's most recent LDL was 133 1 month ago -Currently being followed by PCP  Disposition: Follow-up with None or APP in 3 months  Shared Decision Making/Informed Consent The risks [stroke (1 in 1000), death (1 in 1000), kidney failure [usually temporary] (1 in 500), bleeding (1 in 200), allergic reaction [possibly serious] (1 in 200)], benefits (diagnostic support and management of coronary artery disease) and alternatives of a cardiac catheterization were discussed in detail with Ms. Kail and she is willing to proceed.   Medication Adjustments/Labs and Tests Ordered: Current medicines are reviewed at length with the patient today.  Concerns regarding medicines are outlined above.   Signed, Trew Sunde Jr, Angeli Demilio  Henry, NP 08/26/2022, 8:29 PM [facility]  Note:  This document was prepared using Dragon voice recognition software and may include unintentional dictation errors.  

## 2022-08-27 ENCOUNTER — Ambulatory Visit: Payer: Medicare Other | Attending: Nurse Practitioner | Admitting: Nurse Practitioner

## 2022-08-27 ENCOUNTER — Encounter: Payer: Self-pay | Admitting: Nurse Practitioner

## 2022-08-27 VITALS — BP 144/78 | HR 78 | Ht 67.0 in | Wt 151.0 lb

## 2022-08-27 DIAGNOSIS — I351 Nonrheumatic aortic (valve) insufficiency: Secondary | ICD-10-CM | POA: Insufficient documentation

## 2022-08-27 DIAGNOSIS — E785 Hyperlipidemia, unspecified: Secondary | ICD-10-CM | POA: Insufficient documentation

## 2022-08-27 MED ORDER — SODIUM CHLORIDE 0.9% FLUSH: 3.0000 mL | Freq: Two times a day (BID) | INTRAVENOUS | Status: DC

## 2022-08-27 NOTE — Patient Instructions (Signed)
Medication Instructions:  Your physician recommends that you continue on your current medications as directed. Please refer to the Current Medication list given to you today. *If you need a refill on your cardiac medications before your next appointment, please call your pharmacy*   Lab Work: TODAY-BMET & CBC If you have labs (blood work) drawn today and your tests are completely normal, you will receive your results only by: MyChart Message (if you have MyChart) OR A paper copy in the mail If you have any lab test that is abnormal or we need to change your treatment, we will call you to review the results.   Testing/Procedures: Your physician has requested that you have a cardiac catheterization. Cardiac catheterization is used to diagnose and/or treat various heart conditions. Doctors may recommend this procedure for a number of different reasons. The most common reason is to evaluate chest pain. Chest pain can be a symptom of coronary artery disease (CAD), and cardiac catheterization can show whether plaque is narrowing or blocking your heart's arteries. This procedure is also used to evaluate the valves, as well as measure the blood flow and oxygen levels in different parts of your heart. For further information please visit [email or web address] Please follow instruction sheet, as given.   Follow-Up: At [facility], you and your health needs are our priority.  As part of our continuing mission to provide you with exceptional heart care, we have created designated Provider Care Teams.  These Care Teams include your primary Cardiologist (physician) and Advanced Practice Providers (APPs -  Physician Assistants and Nurse Practitioners) who all work together to provide you with the care you need, when you need it.  We recommend signing up for the patient portal called "MyChart".  Sign up information is provided on this After Visit Summary.  MyChart is used to connect with patients for  Virtual Visits (Telemedicine).  Patients are able to view lab/test results, encounter notes, upcoming appointments, etc.  Non-urgent messages can be sent to your provider as well.   To learn more about what you can do with MyChart, go to [email or web address].    Your next appointment:   3 month(s)  The format for your next appointment:   In Person  Provider:   Dietrich Pates, MD Other Instructions   Important Information About Sugar

## 2022-08-28 LAB — BASIC METABOLIC PANEL
BUN/Creatinine Ratio: 10 — ABNORMAL LOW (ref 12–28)
BUN: 9 mg/dL (ref 8–27)
CO2: 23 mmol/L (ref 20–29)
Calcium: 9.4 mg/dL (ref 8.7–10.3)
Chloride: 102 mmol/L (ref 96–106)
Creatinine, Ser: 0.92 mg/dL (ref 0.57–1.00)
Glucose: 99 mg/dL (ref 70–99)
Potassium: 4 mmol/L (ref 3.5–5.2)
Sodium: 143 mmol/L (ref 134–144)
eGFR: 70 mL/min/{1.73_m2} (ref >59)

## 2022-08-28 LAB — CBC
Hematocrit: 38.6 % (ref 34.0–46.6)
Hemoglobin: 13.1 g/dL (ref 11.1–15.9)
MCH: 31.7 pg (ref 26.6–33.0)
MCHC: 33.9 g/dL (ref 31.5–35.7)
MCV: 94 fL (ref 79–97)
Platelets: 252 10*3/uL (ref 150–450)
RBC: 4.13 x10E6/uL (ref 3.77–5.28)
RDW: 13 % (ref 11.7–15.4)
WBC: 7.3 10*3/uL (ref 3.4–10.8)

## 2022-08-31 ENCOUNTER — Telehealth: Payer: Self-pay | Admitting: *Deleted

## 2022-08-31 NOTE — Telephone Encounter (Signed)
Cardiac Catheterization scheduled at [facility] for: Wednesday September 01, 2022 1 PM Arrival time and place: [facility] Main Entrance A at: 11 AM  Nothing to eat after midnight prior to procedure, clear liquids until 5 AM day of procedure.  Medication instructions: -Usual morning medications can be taken with sips of water including aspirin 81 mg.  Confirmed patient has responsible adult to drive home post procedure and be with patient first 24 hours after arriving home.  Patient reports no new symptoms concerning for COVID-19 in the past 10 days.  Reviewed procedure instructions with patient.

## 2022-09-01 ENCOUNTER — Encounter (HOSPITAL_COMMUNITY)
Admission: RE | Disposition: A | Discharge status: Home / Self Care | Payer: Self-pay | Source: Ambulatory Visit | Attending: Cardiovascular Disease

## 2022-09-01 ENCOUNTER — Other Ambulatory Visit: Payer: Self-pay

## 2022-09-01 ENCOUNTER — Ambulatory Visit (HOSPITAL_COMMUNITY)
Admission: RE | Admit: 2022-09-01 | Discharge: 2022-09-01 | Disposition: A | Discharge status: Home / Self Care | Payer: Medicare Other | Source: Ambulatory Visit | Attending: Cardiovascular Disease | Admitting: Cardiovascular Disease

## 2022-09-01 DIAGNOSIS — I351 Nonrheumatic aortic (valve) insufficiency: Secondary | ICD-10-CM | POA: Diagnosis present

## 2022-09-01 DIAGNOSIS — Z87442 Personal history of urinary calculi: Secondary | ICD-10-CM | POA: Insufficient documentation

## 2022-09-01 DIAGNOSIS — E785 Hyperlipidemia, unspecified: Secondary | ICD-10-CM | POA: Diagnosis not present

## 2022-09-01 HISTORY — PX: RIGHT/LEFT HEART CATH AND CORONARY ANGIOGRAPHY: CATH118266

## 2022-09-01 LAB — POCT I-STAT EG7
Acid-Base Excess: 0 mmol/L (ref 0.0–2.0)
Bicarbonate: 26.2 mmol/L (ref 20.0–28.0)
Calcium, Ion: 1.26 mmol/L (ref 1.15–1.40)
HCT: 38 % (ref 36.0–46.0)
Hemoglobin: 12.9 g/dL (ref 12.0–15.0)
O2 Saturation: 76 %
Potassium: 3.8 mmol/L (ref 3.5–5.1)
Sodium: 141 mmol/L (ref 135–145)
TCO2: 28 mmol/L (ref 22–32)
pCO2, Ven: 46.7 mmHg (ref 44–60)
pH, Ven: 7.357 (ref 7.25–7.43)
pO2, Ven: 43 mmHg (ref 32–45)

## 2022-09-01 LAB — POCT I-STAT 7, (LYTES, BLD GAS, ICA,H+H)
Acid-base deficit: 1 mmol/L (ref 0.0–2.0)
Bicarbonate: 23.6 mmol/L (ref 20.0–28.0)
Calcium, Ion: 1.15 mmol/L (ref 1.15–1.40)
HCT: 36 % (ref 36.0–46.0)
Hemoglobin: 12.2 g/dL (ref 12.0–15.0)
O2 Saturation: 95 %
Potassium: 3.6 mmol/L (ref 3.5–5.1)
Sodium: 141 mmol/L (ref 135–145)
TCO2: 25 mmol/L (ref 22–32)
pCO2 arterial: 37.2 mmHg (ref 32–48)
pH, Arterial: 7.411 (ref 7.35–7.45)
pO2, Arterial: 77 mmHg — ABNORMAL LOW (ref 83–108)

## 2022-09-01 SURGERY — RIGHT/LEFT HEART CATH AND CORONARY ANGIOGRAPHY
Anesthesia: LOCAL

## 2022-09-01 MED ORDER — VERAPAMIL HCL 2.5 MG/ML IV SOLN
INTRAVENOUS | Status: DC | PRN
  Administered 2022-09-01: 10 mL via INTRA_ARTERIAL

## 2022-09-01 MED ORDER — LIDOCAINE HCL (PF) 1 % IJ SOLN
INTRAMUSCULAR | Status: DC | PRN
  Administered 2022-09-01 (×2): 2 mL via INTRADERMAL

## 2022-09-01 MED ORDER — ONDANSETRON HCL 4 MG/2ML IJ SOLN: 4.0000 mg | Freq: Four times a day (QID) | INTRAMUSCULAR | Status: DC | PRN

## 2022-09-01 MED ORDER — SODIUM CHLORIDE 0.9 % WEIGHT BASED INFUSION: 1.0000 mL/kg/h | INTRAVENOUS | Status: DC

## 2022-09-01 MED ORDER — MIDAZOLAM HCL 2 MG/2ML IJ SOLN
INTRAMUSCULAR | Status: AC
  Filled 2022-09-01: qty 2

## 2022-09-01 MED ORDER — ASPIRIN 81 MG PO CHEW: 81.0000 mg | CHEWABLE_TABLET | ORAL | Status: DC

## 2022-09-01 MED ORDER — FENTANYL CITRATE (PF) 100 MCG/2ML IJ SOLN
INTRAMUSCULAR | Status: DC | PRN
  Administered 2022-09-01 (×2): 25 ug via INTRAVENOUS

## 2022-09-01 MED ORDER — VERAPAMIL HCL 2.5 MG/ML IV SOLN
INTRAVENOUS | Status: AC
  Filled 2022-09-01: qty 2

## 2022-09-01 MED ORDER — SODIUM CHLORIDE 0.9% FLUSH: 3.0000 mL | INTRAVENOUS | Status: DC | PRN

## 2022-09-01 MED ORDER — IOHEXOL 350 MG/ML SOLN
INTRAVENOUS | Status: DC | PRN
  Administered 2022-09-01: 68 mL

## 2022-09-01 MED ORDER — FENTANYL CITRATE (PF) 100 MCG/2ML IJ SOLN
INTRAMUSCULAR | Status: AC
  Filled 2022-09-01: qty 2

## 2022-09-01 MED ORDER — HEPARIN (PORCINE) IN NACL 1000-0.9 UT/500ML-% IV SOLN
INTRAVENOUS | Status: AC
  Filled 2022-09-01: qty 1000

## 2022-09-01 MED ORDER — HYDRALAZINE HCL 20 MG/ML IJ SOLN: 10.0000 mg | INTRAMUSCULAR | Status: DC | PRN

## 2022-09-01 MED ORDER — LABETALOL HCL 5 MG/ML IV SOLN: 10.0000 mg | INTRAVENOUS | Status: DC | PRN

## 2022-09-01 MED ORDER — HEPARIN SODIUM (PORCINE) 1000 UNIT/ML IJ SOLN
INTRAMUSCULAR | Status: DC | PRN
  Administered 2022-09-01: 4000 [IU] via INTRAVENOUS

## 2022-09-01 MED ORDER — SODIUM CHLORIDE 0.9% FLUSH: 3.0000 mL | Freq: Two times a day (BID) | INTRAVENOUS | Status: DC

## 2022-09-01 MED ORDER — LIDOCAINE HCL (PF) 1 % IJ SOLN
INTRAMUSCULAR | Status: AC
  Filled 2022-09-01: qty 30

## 2022-09-01 MED ORDER — SODIUM CHLORIDE 0.9 % IV SOLN: 250.0000 mL | INTRAVENOUS | Status: DC | PRN

## 2022-09-01 MED ORDER — MIDAZOLAM HCL 2 MG/2ML IJ SOLN
INTRAMUSCULAR | Status: DC | PRN
  Administered 2022-09-01: 2 mg via INTRAVENOUS
  Administered 2022-09-01: 1 mg via INTRAVENOUS

## 2022-09-01 MED ORDER — SODIUM CHLORIDE 0.9 % WEIGHT BASED INFUSION
3.0000 mL/kg/h | INTRAVENOUS | Status: AC
  Administered 2022-09-01: 3 mL/kg/h via INTRAVENOUS

## 2022-09-01 MED ORDER — ACETAMINOPHEN 325 MG PO TABS: 650.0000 mg | ORAL_TABLET | ORAL | Status: DC | PRN

## 2022-09-01 MED ORDER — HEPARIN SODIUM (PORCINE) 1000 UNIT/ML IJ SOLN
INTRAMUSCULAR | Status: AC
  Filled 2022-09-01: qty 10

## 2022-09-01 MED ORDER — HEPARIN (PORCINE) IN NACL 1000-0.9 UT/500ML-% IV SOLN
INTRAVENOUS | Status: DC | PRN
  Administered 2022-09-01 (×2): 500 mL

## 2022-09-01 SURGICAL SUPPLY — 13 items
CATH 5FR JL3.5 JR4 ANG PIG MP (CATHETERS) IMPLANT
CATH SWAN GANZ 7F STRAIGHT (CATHETERS) IMPLANT
DEVICE RAD TR BAND REGULAR (VASCULAR PRODUCTS) IMPLANT
GLIDESHEATH SLEND SS 6F .021 (SHEATH) IMPLANT
GLIDESHEATH SLENDER 7FR .021G (SHEATH) IMPLANT
GUIDEWIRE INQWIRE 1.5J.035X260 (WIRE) IMPLANT
INQWIRE 1.5J .035X260CM (WIRE) ×1
KIT HEART LEFT (KITS) ×1 IMPLANT
PACK CARDIAC CATHETERIZATION (CUSTOM PROCEDURE TRAY) ×1 IMPLANT
SHEATH PROBE COVER 6X72 (BAG) IMPLANT
SYR MEDRAD MARK 7 150ML (SYRINGE) ×1 IMPLANT
TRANSDUCER W/STOPCOCK (MISCELLANEOUS) ×1 IMPLANT
TUBING CIL FLEX 10 FLL-RA (TUBING) ×1 IMPLANT

## 2022-09-01 NOTE — Progress Notes (Signed)
Patient and husband was given discharge instructions. Both verbalized understanding. 

## 2022-09-01 NOTE — Interval H&P Note (Signed)
History and Physical Interval Note:  09/01/2022 12:21 PM  Theresa [name]  has presented today for surgery, with the diagnosis of aortic stenosis.  The various methods of treatment have been discussed with the patient and family. After consideration of risks, benefits and other options for treatment, the patient has consented to  Procedure(s): RIGHT/LEFT HEART CATH AND CORONARY ANGIOGRAPHY (N/A) as a surgical intervention.  The patient's history has been reviewed, patient examined, no change in status, stable for surgery.  I have reviewed the patient's chart and labs.  Questions were answered to the patient's satisfaction.     Tonny Bollman

## 2022-09-01 NOTE — Discharge Instructions (Signed)

## 2022-09-02 ENCOUNTER — Encounter (HOSPITAL_COMMUNITY): Payer: Self-pay | Admitting: Cardiovascular Disease

## 2022-09-21 NOTE — Progress Notes (Signed)
Surgical Instructions    Your procedure is scheduled on Friday 09/24/22.   Report to [facility] Main Entrance "A" at 05:30 A.M., then check in with the Admitting office.  Call this number if you have problems the morning of surgery:  [phone]   If you have any questions prior to your surgery date call [phone]: Open Monday-Friday 8am-4pm If you experience any cold or flu symptoms such as cough, fever, chills, shortness of breath, etc. between now and your scheduled surgery, please notify us at the above number     Remember:  Do not eat or drink after midnight the night before your surgery    Take these medicines the morning of surgery with A SIP OF WATER:   amLODipine (NORVASC)   cetirizine (ZYRTEC)   gabapentin (NEURONTIN)   levothyroxine (SYNTHROID)    Take these medicines if needed:  carboxymethylcellulose (REFRESH TEARS)    As of today, STOP taking any Aspirin (unless otherwise instructed by your surgeon) Aleve, Naproxen, Ibuprofen, Motrin, Advil, Goody's, BC's, all herbal medications, fish oil, and all vitamins.           Do not wear jewelry or makeup. Do not wear lotions, powders, perfumes/cologne or deodorant. Do not shave 48 hours prior to surgery.  Men may shave face and neck. Do not bring valuables to the hospital. Do not wear nail polish, gel polish, artificial nails, or any other type of covering on natural nails (fingers and toes) If you have artificial nails or gel coating that need to be removed by a nail salon, please have this removed prior to surgery. Artificial nails or gel coating may interfere with anesthesia's ability to adequately monitor your vital signs.  [facility] is not responsible for any belongings or valuables.    Do NOT Smoke (Tobacco/Vaping)  24 hours prior to your procedure  If you use a CPAP at night, you may bring your mask for your overnight stay.   Contacts, glasses, hearing aids, dentures or partials may not be worn into  surgery, please bring cases for these belongings   For patients admitted to the hospital, discharge time will be determined by your treatment team.   Patients discharged the day of surgery will not be allowed to drive home, and someone needs to stay with them for 24 hours.   SURGICAL WAITING ROOM VISITATION Patients having surgery or a procedure may have no more than 2 support people in the waiting area - these visitors may rotate.   Children under the age of 61 must have an adult with them who is not the patient. If the patient needs to stay at the hospital during part of their recovery, the visitor guidelines for inpatient rooms apply. Pre-op nurse will coordinate an appropriate time for 1 support person to accompany patient in pre-op.  This support person may not rotate.   Please refer to [email or web address] for the visitor guidelines for Inpatients (after your surgery is over and you are in a regular room).    Special instructions:    Oral Hygiene is also important to reduce your risk of infection.  Remember - BRUSH YOUR TEETH THE MORNING OF SURGERY WITH YOUR REGULAR TOOTHPASTE   [facility]- Preparing For Surgery  Before surgery, you can play an important role. Because skin is not sterile, your skin needs to be as free of germs as possible. You can reduce the number of germs on your skin by washing with CHG (chlorahexidine gluconate) Soap before surgery.  CHG is  an antiseptic cleaner which kills germs and bonds with the skin to continue killing germs even after washing.     Please do not use if you have an allergy to CHG or antibacterial soaps. If your skin becomes reddened/irritated stop using the CHG.  Do not shave (including legs and underarms) for at least 48 hours prior to first CHG shower. It is OK to shave your face.  Please follow these instructions carefully.     Shower the NIGHT BEFORE SURGERY and the MORNING OF SURGERY  with CHG Soap.   If you chose to wash your hair, wash your hair first as usual with your normal shampoo. After you shampoo, rinse your hair and body thoroughly to remove the shampoo.  Then [employer] and genitals (private parts) with your normal soap and rinse thoroughly to remove soap.  After that Use CHG Soap as you would any other liquid soap. You can apply CHG directly to the skin and wash gently with a scrungie or a clean washcloth.   Apply the CHG Soap to your body ONLY FROM THE NECK DOWN.  Do not use on open wounds or open sores. Avoid contact with your eyes, ears, mouth and genitals (private parts). Wash Face and genitals (private parts)  with your normal soap.   Wash thoroughly, paying special attention to the area where your surgery will be performed.  Thoroughly rinse your body with warm water from the neck down.  DO NOT shower/wash with your normal soap after using and rinsing off the CHG Soap.  Pat yourself dry with a CLEAN TOWEL.  Wear CLEAN PAJAMAS to bed the night before surgery  Place CLEAN SHEETS on your bed the night before your surgery  DO NOT SLEEP WITH PETS.   Day of Surgery:  Take a shower with CHG soap. Wear Clean/Comfortable clothing the morning of surgery Do not apply any deodorants/lotions.   Remember to brush your teeth WITH YOUR REGULAR TOOTHPASTE.    If you received a COVID test during your pre-op visit, it is requested that you wear a mask when out in public, stay away from anyone that may not be feeling well, and notify your surgeon if you develop symptoms. If you have been in contact with anyone that has tested positive in the last 10 days, please notify your surgeon.    Please read over the following fact sheets that you were given.

## 2022-09-22 ENCOUNTER — Ambulatory Visit (HOSPITAL_COMMUNITY)
Admission: RE | Admit: 2022-09-22 | Discharge: 2022-09-22 | Disposition: A | Discharge status: Home / Self Care | Payer: Medicare Other | Source: Ambulatory Visit | Attending: Surgery | Admitting: Surgery

## 2022-09-22 ENCOUNTER — Encounter (HOSPITAL_COMMUNITY)
Admission: RE | Admit: 2022-09-22 | Discharge: 2022-09-22 | Disposition: A | Discharge status: Home / Self Care | Payer: Medicare Other | Source: Ambulatory Visit | Attending: Surgery | Admitting: Surgery

## 2022-09-22 ENCOUNTER — Other Ambulatory Visit: Payer: Self-pay | Admitting: Internal Medicine

## 2022-09-22 ENCOUNTER — Ambulatory Visit (HOSPITAL_BASED_OUTPATIENT_CLINIC_OR_DEPARTMENT_OTHER)
Admission: RE | Admit: 2022-09-22 | Discharge: 2022-09-22 | Disposition: A | Discharge status: Home / Self Care | Payer: Medicare Other | Source: Ambulatory Visit | Attending: Surgery | Admitting: Surgery

## 2022-09-22 ENCOUNTER — Other Ambulatory Visit: Payer: Self-pay

## 2022-09-22 ENCOUNTER — Encounter (HOSPITAL_COMMUNITY): Payer: Self-pay

## 2022-09-22 VITALS — BP 151/72 | HR 81 | Temp 98.0°F | Resp 16 | Ht 67.0 in | Wt 148.7 lb

## 2022-09-22 DIAGNOSIS — Z01818 Encounter for other preprocedural examination: Secondary | ICD-10-CM | POA: Insufficient documentation

## 2022-09-22 DIAGNOSIS — I351 Nonrheumatic aortic (valve) insufficiency: Secondary | ICD-10-CM

## 2022-09-22 DIAGNOSIS — Z5181 Encounter for therapeutic drug level monitoring: Secondary | ICD-10-CM | POA: Insufficient documentation

## 2022-09-22 DIAGNOSIS — R7989 Other specified abnormal findings of blood chemistry: Secondary | ICD-10-CM | POA: Insufficient documentation

## 2022-09-22 DIAGNOSIS — Z1152 Encounter for screening for COVID-19: Secondary | ICD-10-CM | POA: Insufficient documentation

## 2022-09-22 LAB — COMPREHENSIVE METABOLIC PANEL
ALT: 19 U/L (ref 0–44)
AST: 26 U/L (ref 15–41)
Albumin: 4.2 g/dL (ref 3.5–5.0)
Alkaline Phosphatase: 56 U/L (ref 38–126)
Anion gap: 11 (ref 5–15)
BUN: 8 mg/dL (ref 8–23)
CO2: 25 mmol/L (ref 22–32)
Calcium: 9.3 mg/dL (ref 8.9–10.3)
Chloride: 102 mmol/L (ref 98–111)
Creatinine, Ser: 0.89 mg/dL (ref 0.44–1.00)
GFR, Estimated: >60 mL/min (ref >60)
Glucose, Bld: 80 mg/dL (ref 70–99)
Potassium: 3.7 mmol/L (ref 3.5–5.1)
Sodium: 138 mmol/L (ref 135–145)
Total Bilirubin: 0.7 mg/dL (ref 0.3–1.2)
Total Protein: 6.9 g/dL (ref 6.5–8.1)

## 2022-09-22 LAB — BLOOD GAS, ARTERIAL
Acid-Base Excess: 5.2 mmol/L — ABNORMAL HIGH (ref 0.0–2.0)
Bicarbonate: 32.6 mmol/L — ABNORMAL HIGH (ref 20.0–28.0)
Drawn by: 6643
O2 Saturation: 36.9 %
Patient temperature: 37
pCO2 arterial: 59 mmHg — ABNORMAL HIGH (ref 32–48)
pH, Arterial: 7.35 (ref 7.35–7.45)
pO2, Arterial: <31 mmHg — CL (ref 83–108)

## 2022-09-22 LAB — CBC
HCT: 41.3 % (ref 36.0–46.0)
Hemoglobin: 13.9 g/dL (ref 12.0–15.0)
MCH: 31.3 pg (ref 26.0–34.0)
MCHC: 33.7 g/dL (ref 30.0–36.0)
MCV: 93 fL (ref 80.0–100.0)
Platelets: 251 10*3/uL (ref 150–400)
RBC: 4.44 MIL/uL (ref 3.87–5.11)
RDW: 12.4 % (ref 11.5–15.5)
WBC: 5.7 10*3/uL (ref 4.0–10.5)
nRBC: 0 % (ref 0.0–0.2)

## 2022-09-22 LAB — URINALYSIS, ROUTINE W REFLEX MICROSCOPIC
Bilirubin Urine: NEGATIVE
Glucose, UA: NEGATIVE mg/dL
Hgb urine dipstick: NEGATIVE
Ketones, ur: NEGATIVE mg/dL
Leukocytes,Ua: NEGATIVE
Nitrite: NEGATIVE
Protein, ur: NEGATIVE mg/dL
Specific Gravity, Urine: 1.002 — ABNORMAL LOW (ref 1.005–1.030)
pH: 7 (ref 5.0–8.0)

## 2022-09-22 LAB — SURGICAL PCR SCREEN
MRSA, PCR: NEGATIVE
Staphylococcus aureus: NEGATIVE

## 2022-09-22 LAB — SARS CORONAVIRUS 2 (TAT 6-24 HRS): SARS Coronavirus 2: NEGATIVE

## 2022-09-22 LAB — TYPE AND SCREEN
ABO/RH(D): A NEG
Antibody Screen: NEGATIVE

## 2022-09-22 LAB — HEMOGLOBIN A1C
Hgb A1c MFr Bld: 5.4 % (ref 4.8–5.6)
Mean Plasma Glucose: 108.28 mg/dL

## 2022-09-22 LAB — APTT: aPTT: 29 seconds (ref 24–36)

## 2022-09-22 LAB — PROTIME-INR
INR: 0.9 (ref 0.8–1.2)
Prothrombin Time: 12.4 seconds (ref 11.4–15.2)

## 2022-09-22 NOTE — Progress Notes (Signed)
PCP - Dr/ Deborra Medina Cardiologist - Dr. Dorris Carnes  PPM/ICD - n/a Device Orders - n/a Rep Notified - n/a  Chest x-ray - 09/22/21 Within 72 hours EKG - 08/27/22- Within one month Stress Test - denies ECHO - 06/28/22 Cardiac Cath - 09/01/22  Sleep Study - denies CPAP - n/a  Fasting Blood Sugar - n/a Checks Blood Sugar _____ times a day- n/a  Last dose of GLP1 agonist-  n/a GLP1 instructions: n/a  Blood Thinner Instructions: n/a Aspirin Instructions: n/a  ERAS Protcol - No. NPO PRE-SURGERY Ensure or G2- n/a  COVID TEST- 09/22/21. Pending.    Anesthesia review: Yes. Abnormal ABG. Levonne Spiller, RN with Dr. Vivi Martens office made aware. Per the lab tech the blood draw was "dark" and may have been a venous stick.   Patient denies shortness of breath, fever, cough and chest pain at PAT appointment   All instructions explained to the patient, with a verbal understanding of the material. Patient agrees to go over the instructions while at home for a better understanding. Patient also instructed to self quarantine after being tested for COVID-19. The opportunity to ask questions was provided.

## 2022-09-22 NOTE — Progress Notes (Signed)
Pre cabg has been completed.   Preliminary results in CV Proc.   Sender Rueb Cotton [facility] 09/22/2022 1:22 PM

## 2022-09-22 NOTE — Progress Notes (Signed)
Received call from Lab that PO2 is 31.  Lanelle Bal, RN notified.  Will notified Levonne Spiller, RN at Dr. Vivi Martens office.

## 2022-09-22 NOTE — Progress Notes (Signed)
Theresa Spiller, RN with Dr. Vivi Martens office made aware of patient's abnormal ABG result. Thurmond Butts stated she will notify Dr. Cyndia Bent and notify this RN if Dr. Cyndia Bent wants a repeat ABG drawn day of surgery.

## 2022-09-22 NOTE — Progress Notes (Signed)
Per Levonne Spiller, RN at Dr. Vivi Martens office, Dr. Cyndia Bent stated do not repeat ABG day of surgery.

## 2022-09-23 MED ORDER — PHENYLEPHRINE HCL-NACL 20-0.9 MG/250ML-% IV SOLN
30.0000 ug/min | INTRAVENOUS | Status: DC
  Filled 2022-09-23 (×2): qty 250

## 2022-09-23 MED ORDER — INSULIN REGULAR(HUMAN) IN NACL 100-0.9 UT/100ML-% IV SOLN
INTRAVENOUS | Status: AC
  Administered 2022-09-24: .7 [IU]/h via INTRAVENOUS
  Filled 2022-09-23: qty 100

## 2022-09-23 MED ORDER — PLASMA-LYTE A IV SOLN
INTRAVENOUS | Status: DC
  Filled 2022-09-23 (×2): qty 2.5

## 2022-09-23 MED ORDER — TRANEXAMIC ACID 1000 MG/10ML IV SOLN
1.5000 mg/kg/h | INTRAVENOUS | Status: AC
  Administered 2022-09-24: 1.5 mg/kg/h via INTRAVENOUS
  Filled 2022-09-23 (×2): qty 25

## 2022-09-23 MED ORDER — MANNITOL 20 % IV SOLN
INTRAVENOUS | Status: DC
  Filled 2022-09-23 (×2): qty 13

## 2022-09-23 MED ORDER — DEXMEDETOMIDINE HCL IN NACL 400 MCG/100ML IV SOLN
0.1000 ug/kg/h | INTRAVENOUS | Status: AC
  Administered 2022-09-24: .2 ug/kg/h via INTRAVENOUS
  Filled 2022-09-23 (×2): qty 100

## 2022-09-23 MED ORDER — MILRINONE LACTATE IN DEXTROSE 20-5 MG/100ML-% IV SOLN
0.3000 ug/kg/min | INTRAVENOUS | Status: DC
  Filled 2022-09-23 (×2): qty 100

## 2022-09-23 MED ORDER — POTASSIUM CHLORIDE 2 MEQ/ML IV SOLN
80.0000 meq | INTRAVENOUS | Status: DC
  Filled 2022-09-23 (×2): qty 40

## 2022-09-23 MED ORDER — HEPARIN 30,000 UNITS/1000 ML (OHS) CELLSAVER SOLUTION
Status: DC
  Filled 2022-09-23 (×2): qty 1000

## 2022-09-23 MED ORDER — VANCOMYCIN HCL 1250 MG/250ML IV SOLN
1250.0000 mg | INTRAVENOUS | Status: AC
  Administered 2022-09-24: 1250 mg via INTRAVENOUS
  Filled 2022-09-23 (×2): qty 250

## 2022-09-23 MED ORDER — CEFAZOLIN SODIUM-DEXTROSE 2-4 GM/100ML-% IV SOLN
2.0000 g | INTRAVENOUS | Status: AC
  Administered 2022-09-24: 2 g via INTRAVENOUS
  Filled 2022-09-23: qty 100

## 2022-09-23 MED ORDER — NOREPINEPHRINE 4 MG/250ML-% IV SOLN
0.0000 ug/min | INTRAVENOUS | Status: DC
  Filled 2022-09-23 (×2): qty 250

## 2022-09-23 MED ORDER — TRANEXAMIC ACID (OHS) BOLUS VIA INFUSION
15.0000 mg/kg | INTRAVENOUS | Status: AC
  Administered 2022-09-24: 1012.5 mg via INTRAVENOUS
  Filled 2022-09-23 (×2): qty 1013

## 2022-09-23 MED ORDER — TRANEXAMIC ACID (OHS) PUMP PRIME SOLUTION
2.0000 mg/kg | INTRAVENOUS | Status: DC
  Filled 2022-09-23 (×2): qty 1.35

## 2022-09-23 MED ORDER — EPINEPHRINE HCL 5 MG/250ML IV SOLN IN NS
0.0000 ug/min | INTRAVENOUS | Status: DC
  Filled 2022-09-23 (×2): qty 250

## 2022-09-23 NOTE — H&P (Signed)
[address]       [place] [address]             [phone]      Cardiothoracic Surgery Admission History and Physical   PCP is Darrick Huntsman Mar Daring, MD Referring Provider is Pricilla Riffle, MD       Chief Complaint  Patient presents with   Aortic Insuffiency           HPI:   The patient is a 67 year old woman with a long history of a heart murmur and a quadricuspid aortic valve diagnosed in 2018.  2D echocardiogram at that time showed moderate aortic insufficiency with a pressure half-time of 513 ms.  Left ventricular ejection fraction was 55 to 60% with an LV diastolic dimension of 5.1 cm.  Her aortic insufficiency was unchanged on echocardiogram in January 2019 and August 2020 but the diastolic dimension was decreased at 4.6 cm.  Her most recent echo on 06/28/2022 was felt to show moderate to severe aortic insufficiency with near holosystolic reversal in arch color flow although the AI pressure half-time was still 551 ms.  Left ventricular ejection fraction is unchanged at 55 to 60% and the LV diastolic diameter was 4.9 cm.   She lives with her husband.  They are very active walking daily and she has not noticed any shortness of breath or fatigue.  She denies any peripheral edema.  She has had no orthopnea or PND.  She denies any chest pain or pressure.  Her main problems have been orthopedic with multiple spine surgeries, left total hip replacement, and a right knee meniscus repair.     Past Medical History:  Diagnosis Date   Breast screening, unspecified     H/O: Bell's palsy      recurrent   Headache     Heart murmur     History of kidney stones     Hypercalcemia     Osteoporosis     Screening for malignant neoplasm of the cervix     Special screening for malignant neoplasms, colon     Unspecified hypothyroidism      thyroid nodule   Unspecified otitis media             Past Surgical History:  Procedure Laterality Date   ANTERIOR CERVICAL  DECOMP/DISCECTOMY FUSION   13,15    C3 ,4 ,5 [facility]   ANTERIOR FUSION CERVICAL SPINE   2013    [facility]  c5-c6   CHOLECYSTECTOMY   1989   dental implant       MENISCUS REPAIR Right 01/2010    [facility]   SPINE SURGERY        [facility], C3, 4 5   TOTAL ABDOMINAL HYSTERECTOMY        secondary to fibroids still has ovaries   TOTAL HIP ARTHROPLASTY Left 08/31/2021    Procedure: TOTAL HIP ARTHROPLASTY;  Surgeon: Donato Heinz, MD;  Location: [facility] ORS;  Service: Orthopedics;  Laterality: Left;           Family History  Problem Relation Age of Onset   Thyroid nodules Mother     Alzheimer's disease Mother     Hyperlipidemia Father     Breast cancer Paternal Aunt     Thyroid nodules Sister     Cancer Sister 6        in situ   Breast cancer Sister 74   Thyroid nodules Maternal Grandmother  Social History Social History         Tobacco Use   Smoking status: Never   Smokeless tobacco: Never  Vaping Use   Vaping Use: Never used  Substance Use Topics   Alcohol use: Yes      Alcohol/week: 5.0 standard drinks of alcohol      Types: 5 Standard drinks or equivalent per week      Comment: Occasional   Drug use: No            Current Outpatient Medications  Medication Sig Dispense Refill   ALPRAZolam (XANAX XR) 0.5 MG 24 hr tablet Take 1 tablet (0.5 mg total) by mouth at bedtime as needed for anxiety. 30 tablet 2   amLODipine (NORVASC) 2.5 MG tablet Take 1 tablet (2.5 mg total) by mouth daily. 90 tablet 1   cetirizine (ZYRTEC) 10 MG tablet Take 10 mg by mouth daily.       Cholecalciferol (VITAMIN D3) 1000 units CAPS Take 1,000 Units by mouth daily.        gabapentin (NEURONTIN) 100 MG capsule TAKE 2 CAPSULES BY MOUTH THREE TIMES DAILY 180 capsule 0   hydroxypropyl methylcellulose / hypromellose (ISOPTO TEARS / GONIOVISC) 2.5 % ophthalmic solution Place 1 drop into both eyes 2 (two) times daily as needed for dry eyes.       levothyroxine (SYNTHROID) 88 MCG tablet Take 1 tablet by  mouth once daily 90 tablet 0   Magnesium 250 MG TABS Take 1 tablet by mouth daily.       Multiple Vitamin (MULTIVITAMIN) tablet Take 1 tablet by mouth daily.       Probiotic Product (PROBIOTIC PO) Take 1 capsule by mouth daily.        No current facility-administered medications for this visit.           Allergies  Allergen Reactions   Meloxicam Other (See Comments)      Dizziness   Topiramate Other (See Comments)      Caused hair loss   Tramadol Itching and Other (See Comments)      Recently developed      Review of Systems  Constitutional:  Negative for fatigue.  HENT: Negative.    Eyes: Negative.   Respiratory:  Negative for shortness of breath.   Cardiovascular:  Positive for palpitations. Negative for chest pain and leg swelling.  Gastrointestinal:  Positive for diarrhea.  Endocrine: Negative.   Genitourinary: Negative.   Musculoskeletal:  Positive for arthralgias and joint swelling.  Skin: Negative.   Allergic/Immunologic: Negative.   Neurological:  Positive for headaches. Negative for dizziness and syncope.  Hematological: Negative.   Psychiatric/Behavioral:  Positive for sleep disturbance. The patient is nervous/anxious.       BP (!) 145/69 (BP Location: Left Arm, Patient Position: Sitting, Cuff Size: Normal)   Pulse 94   Resp 20   Ht 5\' 7"  (1.702 m)   Wt 150 lb (68 kg)   SpO2 96%   BMI 23.49 kg/m  Physical Exam Constitutional:      Appearance: Normal appearance.  Eyes:     Extraocular Movements: Extraocular movements intact.     Conjunctiva/sclera: Conjunctivae normal.     Pupils: Pupils are equal, round, and reactive to light.  Neck:     Vascular: No carotid bruit.  Cardiovascular:     Rate and Rhythm: Normal rate and regular rhythm.     Pulses: Normal pulses.     Heart sounds: Murmur heard.     Comments:  2/6 systolic murmur RSB, 1/6 diastolic murmur LLSB Pulmonary:     Effort: Pulmonary effort is normal.     Breath sounds: Normal breath sounds.   Musculoskeletal:        General: No swelling.  Skin:    General: Skin is warm and dry.  Neurological:     General: No focal deficit present.     Mental Status: She is alert and oriented to person, place, and time.  Psychiatric:        Mood and Affect: Mood normal.        Behavior: Behavior normal.          Diagnostic Tests:       ECHOCARDIOGRAM REPORT       Patient Name:   Loribeth Francene CastleWRIGHT Saulter Date of Exam: 06/28/2022  Medical Rec #:  [id number]           Height:       65.0 in  Accession #:    [id number]          Weight:       145.0 lb  Date of Birth:  Apr 15, 1956           BSA:          1.725 m  Patient Age:    66 years            BP:           132/86 mmHg  Patient Gender: F                   HR:           72 bpm.  Exam Location:  [address]   Procedure: 2D Echo, Cardiac Doppler, Color Doppler and Strain Analysis   Indications:    I35.1 Aortic Insufficiency    History:        Patient has prior history of Echocardiogram examinations,  most                 recent 05/14/2021. Signs/Symptoms:Murmur.    Sonographer:    Daphine DeutscherNaTashia Rodgers-Jones RDCS  Referring Phys: [id number] PAULA V ROSS   IMPRESSIONS     1. Left ventricular ejection fraction, by estimation, is 55 to 60%. The  left ventricle has normal function. The left ventricle has no regional  wall motion abnormalities. The left ventricular internal cavity size was  mildly dilated. Left ventricular  diastolic parameters were normal. The average left ventricular global  longitudinal strain is -22.3 %. The global longitudinal strain is normal.   2. Right ventricular systolic function is normal. The right ventricular  size is normal.   3. Left atrial size was mildly dilated.   4. The mitral valve is abnormal. Mild mitral valve regurgitation. No  evidence of mitral stenosis.   5. 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 PT1/2 not very short. Near  holosystolic reversal in arch Color flow > 50% of the LvOT diameter  Images  on current study better than 05/14/21 but overall degree of AR looks worse .  The aortic valve is abnormal.   Aortic valve regurgitation is not visualized. No aortic stenosis is  present.   6. The inferior vena cava is normal in size with greater than 50%  respiratory variability, suggesting right atrial pressure of 3 mmHg.   FINDINGS   Left Ventricle: Left ventricular ejection fraction, by estimation, is 55  to 60%. The left ventricle has normal function. The left ventricle has no  regional wall  motion abnormalities. The average left ventricular global  longitudinal strain is -22.3 %.  The global longitudinal strain is normal. The left ventricular internal  cavity size was mildly dilated. There is no left ventricular hypertrophy.  Left ventricular diastolic parameters were normal.   Right Ventricle: The right ventricular size is normal. No increase in  right ventricular wall thickness. Right ventricular systolic function is  normal.   Left Atrium: Left atrial size was mildly dilated.   Right Atrium: Right atrial size was normal in size.   Pericardium: There is no evidence of pericardial effusion.   Mitral Valve: The mitral valve is abnormal. There is moderate thickening  of the mitral valve leaflet(s). Mild mitral valve regurgitation. No  evidence of mitral valve stenosis.   Tricuspid Valve: The tricuspid valve is normal in structure. Tricuspid  valve regurgitation is trivial. No evidence of tricuspid stenosis.   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 PT1/2 not very  short. Near holosystolic reversal in arch Color flow > 50% of the LvOT  diameter Images on current study better than 05/14/21 but overall degree of  AR looks worse. The aortic valve is  abnormal. Aortic valve regurgitation is not visualized. Aortic  regurgitation PHT measures 551 msec. No aortic stenosis is present.   Pulmonic Valve: The pulmonic valve was normal in structure. Pulmonic  valve  regurgitation is not visualized. No evidence of pulmonic stenosis.   Aorta: The aortic root is normal in size and structure.   Venous: The inferior vena cava is normal in size with greater than 50%  respiratory variability, suggesting right atrial pressure of 3 mmHg.   IAS/Shunts: No atrial level shunt detected by color flow Doppler.     LEFT VENTRICLE  PLAX 2D  LVIDd:         4.90 cm   Diastology  LVIDs:         3.40 cm   LV e' medial:    7.84 cm/s  LV PW:         0.80 cm   LV E/e' medial:  7.3  LV IVS:        0.70 cm   LV e' lateral:   9.03 cm/s  LVOT diam:     2.00 cm   LV E/e' lateral: 6.3  LV SV:         80  LV SV Index:   47        2D Longitudinal Strain  LVOT Area:     3.14 cm  2D Strain GLS (A2C):   -22.4 %                           2D Strain GLS (A3C):   -22.0 %                           2D Strain GLS (A4C):   -22.5 %                           2D Strain GLS Avg:     -22.3 %   RIGHT VENTRICLE             IVC  RV Basal diam:  3.40 cm     IVC diam: 1.60 cm  RV S prime:     14.70 cm/s  TAPSE (M-mode): 2.8 cm   LEFT ATRIUM  Index        RIGHT ATRIUM          Index  LA diam:        4.10 cm 2.38 cm/m   RA Area:     9.29 cm  LA Vol (A2C):   52.7 ml 30.54 ml/m  RA Volume:   19.50 ml 11.30 ml/m  LA Vol (A4C):   41.2 ml 23.88 ml/m  LA Biplane Vol: 49.7 ml 28.80 ml/m   AORTIC VALVE  LVOT Vmax:   108.50 cm/s  LVOT Vmean:  68.650 cm/s  LVOT VTI:    0.256 m  AI PHT:      551 msec    AORTA  Ao Root diam: 3.30 cm  Ao Asc diam:  3.60 cm   MITRAL VALVE  MV Area (PHT): 3.17 cm    SHUNTS  MV Decel Time: 239 msec    Systemic VTI:  0.26 m  MV E velocity: 57.10 cm/s  Systemic Diam: 2.00 cm  MV A velocity: 65.00 cm/s  MV E/A ratio:  0.88   Charlton Haws MD  Electronically signed by Charlton Haws MD  Signature Date/Time: 06/28/2022/1:06:26 PM        Final       Physicians  Panel Physicians Referring Physician Case Authorizing Physician  Tonny Bollman, MD (Primary)     Procedures  RIGHT/LEFT HEART CATH AND CORONARY ANGIOGRAPHY   Conclusion  1.  Minimal irregularity in the RCA, otherwise angiographically normal coronary arteries 2.  Normal intracardiac pressures with normal PA pressure, low wedge and LVEDP, normal cardiac output   Indications  Nonrheumatic aortic valve insufficiency [I35.1 (ICD-10-CM)]   Procedural Details  Technical Details INDICATION: Patient with a quadricuspid aortic valve with severe aortic insufficiency, referred for preoperative right and left heart catheterization.  PROCEDURAL DETAILS: Using direct ultrasound guidance, the right brachial vein was accessed with a micropuncture needle and a 6/7 French slender sheath is inserted.  Ultrasound images are digitally captured and stored in the patient's chart.  The right wrist was then prepped, draped, and anesthetized with 1% lidocaine. Using vascular ultrasound guidance a 5/6 French Slender sheath was placed in the right radial artery. Intra-arterial verapamil was administered through the radial artery sheath. IV heparin was administered after a JR4 catheter was advanced into the central aorta. A Swan-Ganz catheter was used for the right heart catheterization. Standard protocol was followed for recording of right heart pressures and sampling of oxygen saturations. Fick cardiac output was calculated. Standard Judkins catheters were used for selective coronary angiography. LV pressure is recorded and an aortic valve pullback is performed. There were no immediate procedural complications. The patient was transferred to the post catheterization recovery area for further monitoring.      Estimated blood loss <50 mL.   During this procedure medications were administered to achieve and maintain moderate conscious sedation while the patient's heart rate, blood pressure, and oxygen saturation were continuously monitored and I was present face-to-face 100% of this time.    Medications (Filter: Administrations occurring from 1209 to 1318 on 09/01/22)  important  Continuous medications are totaled by the amount administered until 09/01/22 1318.   fentaNYL (SUBLIMAZE) injection (mcg)  Total dose: 50 mcg Date/Time Rate/Dose/Volume Action   09/01/22 1228 25 mcg Given   1247 25 mcg Given   midazolam (VERSED) injection (mg)  Total dose: 3 mg Date/Time Rate/Dose/Volume Action   09/01/22 1228 2 mg Given   1247 1 mg Given   lidocaine (PF) (  XYLOCAINE) 1 % injection (mL)  Total volume: 4 mL Date/Time Rate/Dose/Volume Action   09/01/22 1243 2 mL Given   1244 2 mL Given   Radial Cocktail/Verapamil only (mL)  Total volume: 10 mL Date/Time Rate/Dose/Volume Action   09/01/22 1247 10 mL Given   heparin sodium (porcine) injection (Units)  Total dose: 4,000 Units Date/Time Rate/Dose/Volume Action   09/01/22 1254 4,000 Units Given   iohexol (OMNIPAQUE) 350 MG/ML injection (mL)  Total volume: 68 mL Date/Time Rate/Dose/Volume Action   09/01/22 1307 68 mL Given   Heparin (Porcine) in NaCl 1000-0.9 UT/500ML-% SOLN (mL)  Total volume: 1,000 mL Date/Time Rate/Dose/Volume Action   09/01/22 1307 500 mL Given   1308 500 mL Given    Sedation Time  Sedation Time Physician-1: 35 minutes Contrast  Medication Name Total Dose  iohexol (OMNIPAQUE) 350 MG/ML injection 68 mL   Radiation/Fluoro  Fluoro time: 4.7 (min) DAP: 5.6 (Gycm2) Cumulative Air Kerma: 108.9 (mGy) Coronary Findings  Diagnostic Dominance: Right Left Main  Vessel is angiographically normal.    Left Anterior Descending  Vessel is angiographically normal. The LAD is a large vessel. The vessel reaches and wraps around the apex of the heart. The first diagonal is small and the second diagonal covers a large territory. There is no significant stenosis throughout the LAD distribution.    Left Circumflex  Vessel is angiographically normal. The circumflex is patent throughout with no stenosis. The  first OM is large without any significant stenoses. This is the only significant branch of the circumflex.    Right Coronary Artery  The vessel exhibits minimal luminal irregularities. The RCA is dominant. The mid vessel has some minor irregularities but there were no significant stenoses throughout the course of the RCA. The PDA and PLA branches are patent with no stenoses.    Intervention   No interventions have been documented.   Coronary Diagrams  Diagnostic Dominance: Right  Intervention   Implants   No implant documentation for this case.   Syngo Images   Show images for CARDIAC CATHETERIZATION Images on Long Term Storage   Show images for Alvina, Strother "Becky" Link to Procedure Log  Procedure Log    Hemo Data  Flowsheet Row Most Recent Value  Aortic Mean Gradient 5.69 mmHg  Aortic Peak Gradient 2.1 mmHg  RA A Wave 3 mmHg  RA V Wave 1 mmHg  RA Mean 0 mmHg  RV Systolic Pressure 24 mmHg  RV Diastolic Pressure 0 mmHg  RV EDP 4 mmHg  PA Systolic Pressure 18 mmHg  PA Diastolic Pressure 5 mmHg  PA Mean 12 mmHg  PW A Wave 4 mmHg  PW V Wave 2 mmHg  PW Mean 1 mmHg  AO Systolic Pressure 128 mmHg  AO Diastolic Pressure 59 mmHg  AO Mean 88 mmHg  LV Systolic Pressure 121 mmHg  LV Diastolic Pressure 0 mmHg  LV EDP 3 mmHg  AOp Systolic Pressure 132 mmHg  AOp Diastolic Pressure 64 mmHg  AOp Mean Pressure 93 mmHg  LVp Systolic Pressure 134 mmHg  LVp Diastolic Pressure 0 mmHg  LVp EDP Pressure 3 mmHg   Impression:   This 67 year old woman has stage C, asymptomatic, 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  I have personally reviewed her 2D echocardiogram and reviewed the images with her and her husband.  Her valve leaflets are thickened with lack of central coaptation and central regurgitation.  Her left ventricular systolic function is normal with mild LV dilation  at 4.9 cm which has not significantly changed over the past  several years.  Her aortic insufficiency visually looks worse than it did on her echo 1 year ago although the pressure half-time is unchanged.  She would like to proceed with surgery to prevent further LV dysfunction.   Plan:   Aortic valve replacement using a bioprosthetic valve.     Gaye Pollack, MD [facility] [phone]

## 2022-09-24 ENCOUNTER — Inpatient Hospital Stay (HOSPITAL_COMMUNITY): Payer: Medicare Other

## 2022-09-24 ENCOUNTER — Encounter (HOSPITAL_COMMUNITY)
Admission: RE | Disposition: A | Discharge status: Home / Self Care | Payer: Self-pay | Source: Ambulatory Visit | Attending: Surgery

## 2022-09-24 ENCOUNTER — Other Ambulatory Visit: Payer: Self-pay

## 2022-09-24 ENCOUNTER — Other Ambulatory Visit: Payer: Self-pay | Admitting: Cardiology

## 2022-09-24 ENCOUNTER — Inpatient Hospital Stay (HOSPITAL_COMMUNITY): Payer: Medicare Other | Admitting: Physician Assistant

## 2022-09-24 ENCOUNTER — Inpatient Hospital Stay (HOSPITAL_COMMUNITY)
Admission: RE | Admit: 2022-09-24 | Discharge: 2022-09-29 | DRG: 220 | Disposition: A | Discharge status: Home Health | Payer: Medicare Other | Attending: Surgery | Admitting: Surgery

## 2022-09-24 DIAGNOSIS — Z83438 Family history of other disorder of lipoprotein metabolism and other lipidemia: Secondary | ICD-10-CM

## 2022-09-24 DIAGNOSIS — Z96642 Presence of left artificial hip joint: Secondary | ICD-10-CM | POA: Diagnosis present

## 2022-09-24 DIAGNOSIS — Z820 Family history of epilepsy and other diseases of the nervous system: Secondary | ICD-10-CM | POA: Diagnosis not present

## 2022-09-24 DIAGNOSIS — Z9049 Acquired absence of other specified parts of digestive tract: Secondary | ICD-10-CM

## 2022-09-24 DIAGNOSIS — E039 Hypothyroidism, unspecified: Secondary | ICD-10-CM | POA: Diagnosis present

## 2022-09-24 DIAGNOSIS — Z952 Presence of prosthetic heart valve: Principal | ICD-10-CM

## 2022-09-24 DIAGNOSIS — Z90710 Acquired absence of both cervix and uterus: Secondary | ICD-10-CM

## 2022-09-24 DIAGNOSIS — I4891 Unspecified atrial fibrillation: Secondary | ICD-10-CM | POA: Diagnosis not present

## 2022-09-24 DIAGNOSIS — Z1152 Encounter for screening for COVID-19: Secondary | ICD-10-CM

## 2022-09-24 DIAGNOSIS — I351 Nonrheumatic aortic (valve) insufficiency: Principal | ICD-10-CM | POA: Diagnosis present

## 2022-09-24 DIAGNOSIS — M810 Age-related osteoporosis without current pathological fracture: Secondary | ICD-10-CM | POA: Diagnosis present

## 2022-09-24 DIAGNOSIS — D62 Acute posthemorrhagic anemia: Secondary | ICD-10-CM | POA: Diagnosis not present

## 2022-09-24 DIAGNOSIS — E8770 Fluid overload, unspecified: Secondary | ICD-10-CM | POA: Diagnosis not present

## 2022-09-24 DIAGNOSIS — Q238 Other congenital malformations of aortic and mitral valves: Secondary | ICD-10-CM

## 2022-09-24 DIAGNOSIS — Z803 Family history of malignant neoplasm of breast: Secondary | ICD-10-CM | POA: Diagnosis not present

## 2022-09-24 DIAGNOSIS — Z79890 Hormone replacement therapy (postmenopausal): Secondary | ICD-10-CM

## 2022-09-24 DIAGNOSIS — Z79899 Other long term (current) drug therapy: Secondary | ICD-10-CM

## 2022-09-24 DIAGNOSIS — I083 Combined rheumatic disorders of mitral, aortic and tricuspid valves: Secondary | ICD-10-CM | POA: Diagnosis not present

## 2022-09-24 HISTORY — PX: AORTIC VALVE REPLACEMENT: SHX41

## 2022-09-24 HISTORY — PX: TEE WITHOUT CARDIOVERSION: SHX5443

## 2022-09-24 LAB — BASIC METABOLIC PANEL
Anion gap: 6 (ref 5–15)
BUN: 9 mg/dL (ref 8–23)
CO2: 21 mmol/L — ABNORMAL LOW (ref 22–32)
Calcium: 7.5 mg/dL — ABNORMAL LOW (ref 8.9–10.3)
Chloride: 108 mmol/L (ref 98–111)
Creatinine, Ser: 0.56 mg/dL (ref 0.44–1.00)
GFR, Estimated: >60 mL/min (ref >60)
Glucose, Bld: 140 mg/dL — ABNORMAL HIGH (ref 70–99)
Potassium: 3.9 mmol/L (ref 3.5–5.1)
Sodium: 135 mmol/L (ref 135–145)

## 2022-09-24 LAB — HEMOGLOBIN AND HEMATOCRIT, BLOOD
HCT: 26 % — ABNORMAL LOW (ref 36.0–46.0)
Hemoglobin: 8.9 g/dL — ABNORMAL LOW (ref 12.0–15.0)

## 2022-09-24 LAB — PROTIME-INR
INR: 1.4 — ABNORMAL HIGH (ref 0.8–1.2)
Prothrombin Time: 16.7 seconds — ABNORMAL HIGH (ref 11.4–15.2)

## 2022-09-24 LAB — POCT I-STAT EG7
Acid-base deficit: 1 mmol/L (ref 0.0–2.0)
Bicarbonate: 23.6 mmol/L (ref 20.0–28.0)
Calcium, Ion: 1.02 mmol/L — ABNORMAL LOW (ref 1.15–1.40)
HCT: 24 % — ABNORMAL LOW (ref 36.0–46.0)
Hemoglobin: 8.2 g/dL — ABNORMAL LOW (ref 12.0–15.0)
O2 Saturation: 77 %
Potassium: 3.5 mmol/L (ref 3.5–5.1)
Sodium: 140 mmol/L (ref 135–145)
TCO2: 25 mmol/L (ref 22–32)
pCO2, Ven: 37.8 mmHg — ABNORMAL LOW (ref 44–60)
pH, Ven: 7.404 (ref 7.25–7.43)
pO2, Ven: 41 mmHg (ref 32–45)

## 2022-09-24 LAB — POCT I-STAT 7, (LYTES, BLD GAS, ICA,H+H)
Acid-base deficit: 1 mmol/L (ref 0.0–2.0)
Acid-base deficit: 2 mmol/L (ref 0.0–2.0)
Acid-base deficit: 3 mmol/L — ABNORMAL HIGH (ref 0.0–2.0)
Acid-base deficit: 4 mmol/L — ABNORMAL HIGH (ref 0.0–2.0)
Acid-base deficit: 6 mmol/L — ABNORMAL HIGH (ref 0.0–2.0)
Bicarbonate: 23.3 mmol/L (ref 20.0–28.0)
Bicarbonate: 22.6 mmol/L (ref 20.0–28.0)
Bicarbonate: 22.1 mmol/L (ref 20.0–28.0)
Bicarbonate: 20.9 mmol/L (ref 20.0–28.0)
Bicarbonate: 20.4 mmol/L (ref 20.0–28.0)
Calcium, Ion: 1.21 mmol/L (ref 1.15–1.40)
Calcium, Ion: 0.97 mmol/L — ABNORMAL LOW (ref 1.15–1.40)
Calcium, Ion: 1.09 mmol/L — ABNORMAL LOW (ref 1.15–1.40)
Calcium, Ion: 1.15 mmol/L (ref 1.15–1.40)
Calcium, Ion: 1.18 mmol/L (ref 1.15–1.40)
HCT: 33 % — ABNORMAL LOW (ref 36.0–46.0)
HCT: 24 % — ABNORMAL LOW (ref 36.0–46.0)
HCT: 32 % — ABNORMAL LOW (ref 36.0–46.0)
HCT: 33 % — ABNORMAL LOW (ref 36.0–46.0)
HCT: 32 % — ABNORMAL LOW (ref 36.0–46.0)
Hemoglobin: 11.2 g/dL — ABNORMAL LOW (ref 12.0–15.0)
Hemoglobin: 8.2 g/dL — ABNORMAL LOW (ref 12.0–15.0)
Hemoglobin: 10.9 g/dL — ABNORMAL LOW (ref 12.0–15.0)
Hemoglobin: 11.2 g/dL — ABNORMAL LOW (ref 12.0–15.0)
Hemoglobin: 10.9 g/dL — ABNORMAL LOW (ref 12.0–15.0)
O2 Saturation: 100 %
O2 Saturation: 100 %
O2 Saturation: 99 %
O2 Saturation: 99 %
O2 Saturation: 96 %
Patient temperature: 35.7
Patient temperature: 36.3
Patient temperature: 36.7
Potassium: 3.8 mmol/L (ref 3.5–5.1)
Potassium: 3.9 mmol/L (ref 3.5–5.1)
Potassium: 3.5 mmol/L (ref 3.5–5.1)
Potassium: 4.5 mmol/L (ref 3.5–5.1)
Potassium: 4.2 mmol/L (ref 3.5–5.1)
Sodium: 139 mmol/L (ref 135–145)
Sodium: 140 mmol/L (ref 135–145)
Sodium: 140 mmol/L (ref 135–145)
Sodium: 139 mmol/L (ref 135–145)
Sodium: 137 mmol/L (ref 135–145)
TCO2: 24 mmol/L (ref 22–32)
TCO2: 24 mmol/L (ref 22–32)
TCO2: 23 mmol/L (ref 22–32)
TCO2: 22 mmol/L (ref 22–32)
TCO2: 22 mmol/L (ref 22–32)
pCO2 arterial: 36.8 mmHg (ref 32–48)
pCO2 arterial: 35.6 mmHg (ref 32–48)
pCO2 arterial: 36.1 mmHg (ref 32–48)
pCO2 arterial: 34 mmHg (ref 32–48)
pCO2 arterial: 42 mmHg (ref 32–48)
pH, Arterial: 7.41 (ref 7.35–7.45)
pH, Arterial: 7.41 (ref 7.35–7.45)
pH, Arterial: 7.389 (ref 7.35–7.45)
pH, Arterial: 7.393 (ref 7.35–7.45)
pH, Arterial: 7.292 — ABNORMAL LOW (ref 7.35–7.45)
pO2, Arterial: 472 mmHg — ABNORMAL HIGH (ref 83–108)
pO2, Arterial: 378 mmHg — ABNORMAL HIGH (ref 83–108)
pO2, Arterial: 144 mmHg — ABNORMAL HIGH (ref 83–108)
pO2, Arterial: 153 mmHg — ABNORMAL HIGH (ref 83–108)
pO2, Arterial: 89 mmHg (ref 83–108)

## 2022-09-24 LAB — POCT I-STAT, CHEM 8
BUN: 8 mg/dL (ref 8–23)
BUN: 7 mg/dL — ABNORMAL LOW (ref 8–23)
BUN: 7 mg/dL — ABNORMAL LOW (ref 8–23)
BUN: 8 mg/dL (ref 8–23)
Calcium, Ion: 1.21 mmol/L (ref 1.15–1.40)
Calcium, Ion: 1.19 mmol/L (ref 1.15–1.40)
Calcium, Ion: 1.04 mmol/L — ABNORMAL LOW (ref 1.15–1.40)
Calcium, Ion: 1.08 mmol/L — ABNORMAL LOW (ref 1.15–1.40)
Chloride: 104 mmol/L (ref 98–111)
Chloride: 105 mmol/L (ref 98–111)
Chloride: 103 mmol/L (ref 98–111)
Chloride: 103 mmol/L (ref 98–111)
Creatinine, Ser: 0.5 mg/dL (ref 0.44–1.00)
Creatinine, Ser: 0.4 mg/dL — ABNORMAL LOW (ref 0.44–1.00)
Creatinine, Ser: 0.5 mg/dL (ref 0.44–1.00)
Creatinine, Ser: 0.4 mg/dL — ABNORMAL LOW (ref 0.44–1.00)
Glucose, Bld: 97 mg/dL (ref 70–99)
Glucose, Bld: 110 mg/dL — ABNORMAL HIGH (ref 70–99)
Glucose, Bld: 118 mg/dL — ABNORMAL HIGH (ref 70–99)
Glucose, Bld: 160 mg/dL — ABNORMAL HIGH (ref 70–99)
HCT: 36 % (ref 36.0–46.0)
HCT: 30 % — ABNORMAL LOW (ref 36.0–46.0)
HCT: 26 % — ABNORMAL LOW (ref 36.0–46.0)
HCT: 26 % — ABNORMAL LOW (ref 36.0–46.0)
Hemoglobin: 12.2 g/dL (ref 12.0–15.0)
Hemoglobin: 10.2 g/dL — ABNORMAL LOW (ref 12.0–15.0)
Hemoglobin: 8.8 g/dL — ABNORMAL LOW (ref 12.0–15.0)
Hemoglobin: 8.8 g/dL — ABNORMAL LOW (ref 12.0–15.0)
Potassium: 3.8 mmol/L (ref 3.5–5.1)
Potassium: 3.6 mmol/L (ref 3.5–5.1)
Potassium: 4.5 mmol/L (ref 3.5–5.1)
Potassium: 4.3 mmol/L (ref 3.5–5.1)
Sodium: 140 mmol/L (ref 135–145)
Sodium: 139 mmol/L (ref 135–145)
Sodium: 138 mmol/L (ref 135–145)
Sodium: 137 mmol/L (ref 135–145)
TCO2: 25 mmol/L (ref 22–32)
TCO2: 25 mmol/L (ref 22–32)
TCO2: 24 mmol/L (ref 22–32)
TCO2: 24 mmol/L (ref 22–32)

## 2022-09-24 LAB — CBC
HCT: 31.3 % — ABNORMAL LOW (ref 36.0–46.0)
HCT: 32 % — ABNORMAL LOW (ref 36.0–46.0)
Hemoglobin: 10.5 g/dL — ABNORMAL LOW (ref 12.0–15.0)
Hemoglobin: 11 g/dL — ABNORMAL LOW (ref 12.0–15.0)
MCH: 31.1 pg (ref 26.0–34.0)
MCH: 31.5 pg (ref 26.0–34.0)
MCHC: 33.5 g/dL (ref 30.0–36.0)
MCHC: 34.4 g/dL (ref 30.0–36.0)
MCV: 92.6 fL (ref 80.0–100.0)
MCV: 91.7 fL (ref 80.0–100.0)
Platelets: 145 10*3/uL — ABNORMAL LOW (ref 150–400)
Platelets: 165 10*3/uL (ref 150–400)
RBC: 3.38 MIL/uL — ABNORMAL LOW (ref 3.87–5.11)
RBC: 3.49 MIL/uL — ABNORMAL LOW (ref 3.87–5.11)
RDW: 12.2 % (ref 11.5–15.5)
RDW: 12.3 % (ref 11.5–15.5)
WBC: 8.4 10*3/uL (ref 4.0–10.5)
WBC: 11.4 10*3/uL — ABNORMAL HIGH (ref 4.0–10.5)
nRBC: 0 % (ref 0.0–0.2)
nRBC: 0 % (ref 0.0–0.2)

## 2022-09-24 LAB — ECHO INTRAOPERATIVE TEE
AV Vena cont: 0.6 cm
Height: 67 in
P 1/2 time: 825 msec
Single Plane A4C EF: 62.9 %
Weight: 2368 oz

## 2022-09-24 LAB — GLUCOSE, CAPILLARY
Glucose-Capillary: 106 mg/dL — ABNORMAL HIGH (ref 70–99)
Glucose-Capillary: 122 mg/dL — ABNORMAL HIGH (ref 70–99)
Glucose-Capillary: 131 mg/dL — ABNORMAL HIGH (ref 70–99)
Glucose-Capillary: 131 mg/dL — ABNORMAL HIGH (ref 70–99)
Glucose-Capillary: 131 mg/dL — ABNORMAL HIGH (ref 70–99)
Glucose-Capillary: 133 mg/dL — ABNORMAL HIGH (ref 70–99)
Glucose-Capillary: 134 mg/dL — ABNORMAL HIGH (ref 70–99)
Glucose-Capillary: 137 mg/dL — ABNORMAL HIGH (ref 70–99)
Glucose-Capillary: 150 mg/dL — ABNORMAL HIGH (ref 70–99)
Glucose-Capillary: 161 mg/dL — ABNORMAL HIGH (ref 70–99)
Glucose-Capillary: 181 mg/dL — ABNORMAL HIGH (ref 70–99)

## 2022-09-24 LAB — PLATELET COUNT: Platelets: 160 10*3/uL (ref 150–400)

## 2022-09-24 LAB — MAGNESIUM: Magnesium: 2.8 mg/dL — ABNORMAL HIGH (ref 1.7–2.4)

## 2022-09-24 LAB — APTT: aPTT: 35 seconds (ref 24–36)

## 2022-09-24 SURGERY — REPLACEMENT, AORTIC VALVE, OPEN
Anesthesia: General | Site: Chest

## 2022-09-24 MED ORDER — CHLORHEXIDINE GLUCONATE 4 % EX LIQD: 30.0000 mL | CUTANEOUS | Status: DC

## 2022-09-24 MED ORDER — CHLORHEXIDINE GLUCONATE 0.12 % MT SOLN: 15.0000 mL | Freq: Once | OROMUCOSAL | Status: DC

## 2022-09-24 MED ORDER — MIDAZOLAM HCL (PF) 5 MG/ML IJ SOLN
INTRAMUSCULAR | Status: DC | PRN
  Administered 2022-09-24 (×3): 2 mg via INTRAVENOUS
  Administered 2022-09-24: 1 mg via INTRAVENOUS

## 2022-09-24 MED ORDER — PROPOFOL 10 MG/ML IV BOLUS
INTRAVENOUS | Status: DC | PRN
  Administered 2022-09-24: 100 mg via INTRAVENOUS
  Administered 2022-09-24 (×2): 50 mg via INTRAVENOUS

## 2022-09-24 MED ORDER — LACTATED RINGERS IV SOLN: INTRAVENOUS | Status: DC | PRN

## 2022-09-24 MED ORDER — ACETAMINOPHEN 160 MG/5ML PO SOLN: 1000.0000 mg | Freq: Four times a day (QID) | ORAL | Status: DC

## 2022-09-24 MED ORDER — SODIUM CHLORIDE 0.45 % IV SOLN: INTRAVENOUS | Status: DC | PRN

## 2022-09-24 MED ORDER — MORPHINE SULFATE (PF) 2 MG/ML IV SOLN
1.0000 mg | INTRAVENOUS | Status: DC | PRN
  Administered 2022-09-24 (×2): 2 mg via INTRAVENOUS
  Administered 2022-09-24: 1 mg via INTRAVENOUS
  Filled 2022-09-24 (×4): qty 1

## 2022-09-24 MED ORDER — CHLORHEXIDINE GLUCONATE 0.12 % MT SOLN
15.0000 mL | Freq: Once | OROMUCOSAL | Status: AC
  Administered 2022-09-24: 15 mL via OROMUCOSAL
  Filled 2022-09-24: qty 15

## 2022-09-24 MED ORDER — MIDAZOLAM HCL 2 MG/2ML IJ SOLN: 2.0000 mg | INTRAMUSCULAR | Status: DC | PRN

## 2022-09-24 MED ORDER — ACETAMINOPHEN 650 MG RE SUPP
650.0000 mg | Freq: Once | RECTAL | Status: AC
  Administered 2022-09-24: 650 mg via RECTAL

## 2022-09-24 MED ORDER — ASPIRIN 81 MG PO CHEW
324.0000 mg | CHEWABLE_TABLET | Freq: Every day | ORAL | Status: DC
  Administered 2022-09-28: 324 mg
  Filled 2022-09-24: qty 4

## 2022-09-24 MED ORDER — HEPARIN SODIUM (PORCINE) 1000 UNIT/ML IJ SOLN
INTRAMUSCULAR | Status: DC | PRN
  Administered 2022-09-24: 22000 [IU] via INTRAVENOUS

## 2022-09-24 MED ORDER — NITROGLYCERIN IN D5W 200-5 MCG/ML-% IV SOLN: 0.0000 ug/min | INTRAVENOUS | Status: DC

## 2022-09-24 MED ORDER — ORAL CARE MOUTH RINSE: 15.0000 mL | Freq: Once | OROMUCOSAL | Status: AC

## 2022-09-24 MED ORDER — FAMOTIDINE IN NACL 20-0.9 MG/50ML-% IV SOLN
20.0000 mg | Freq: Two times a day (BID) | INTRAVENOUS | Status: AC
  Administered 2022-09-24 (×2): 20 mg via INTRAVENOUS
  Filled 2022-09-24 (×2): qty 50

## 2022-09-24 MED ORDER — SODIUM CHLORIDE 0.9 % IV SOLN: 250.0000 mL | INTRAVENOUS | Status: DC

## 2022-09-24 MED ORDER — PHENYLEPHRINE 80 MCG/ML (10ML) SYRINGE FOR IV PUSH (FOR BLOOD PRESSURE SUPPORT)
PREFILLED_SYRINGE | INTRAVENOUS | Status: DC | PRN
  Administered 2022-09-24 (×3): 80 ug via INTRAVENOUS

## 2022-09-24 MED ORDER — DOCUSATE SODIUM 100 MG PO CAPS
200.0000 mg | ORAL_CAPSULE | Freq: Every day | ORAL | Status: DC
  Administered 2022-09-25 – 2022-09-28 (×3): 200 mg via ORAL
  Filled 2022-09-24 (×5): qty 2

## 2022-09-24 MED ORDER — INSULIN REGULAR(HUMAN) IN NACL 100-0.9 UT/100ML-% IV SOLN
INTRAVENOUS | Status: DC
  Administered 2022-09-24: 1.8 [IU]/h via INTRAVENOUS

## 2022-09-24 MED ORDER — BISACODYL 10 MG RE SUPP: 10.0000 mg | Freq: Every day | RECTAL | Status: DC

## 2022-09-24 MED ORDER — ASPIRIN 325 MG PO TBEC
325.0000 mg | DELAYED_RELEASE_TABLET | Freq: Every day | ORAL | Status: DC
  Administered 2022-09-25 – 2022-09-29 (×4): 325 mg via ORAL
  Filled 2022-09-24 (×5): qty 1

## 2022-09-24 MED ORDER — EPHEDRINE SULFATE-NACL 50-0.9 MG/10ML-% IV SOSY
PREFILLED_SYRINGE | INTRAVENOUS | Status: DC | PRN
  Administered 2022-09-24: 5 mg via INTRAVENOUS

## 2022-09-24 MED ORDER — METOPROLOL TARTRATE 5 MG/5ML IV SOLN: 2.5000 mg | INTRAVENOUS | Status: DC | PRN

## 2022-09-24 MED ORDER — FENTANYL CITRATE (PF) 250 MCG/5ML IJ SOLN
INTRAMUSCULAR | Status: DC | PRN
  Administered 2022-09-24 (×2): 150 ug via INTRAVENOUS
  Administered 2022-09-24: 100 ug via INTRAVENOUS
  Administered 2022-09-24: 150 ug via INTRAVENOUS
  Administered 2022-09-24 (×2): 100 ug via INTRAVENOUS
  Administered 2022-09-24: 150 ug via INTRAVENOUS
  Administered 2022-09-24: 200 ug via INTRAVENOUS

## 2022-09-24 MED ORDER — HEMOSTATIC AGENTS (NO CHARGE) OPTIME
TOPICAL | Status: DC | PRN
  Administered 2022-09-24 (×4): 1 via TOPICAL

## 2022-09-24 MED ORDER — FENTANYL CITRATE (PF) 250 MCG/5ML IJ SOLN
INTRAMUSCULAR | Status: AC
  Filled 2022-09-24: qty 5

## 2022-09-24 MED ORDER — SODIUM BICARBONATE 8.4 % IV SOLN
50.0000 meq | Freq: Once | INTRAVENOUS | Status: AC
  Administered 2022-09-24: 50 meq via INTRAVENOUS

## 2022-09-24 MED ORDER — ORAL CARE MOUTH RINSE: 15.0000 mL | OROMUCOSAL | Status: DC | PRN

## 2022-09-24 MED ORDER — METOPROLOL TARTRATE 25 MG/10 ML ORAL SUSPENSION
12.5000 mg | Freq: Two times a day (BID) | ORAL | Status: DC
  Filled 2022-09-24 (×6): qty 5

## 2022-09-24 MED ORDER — ACETAMINOPHEN 500 MG PO TABS
1000.0000 mg | ORAL_TABLET | Freq: Four times a day (QID) | ORAL | Status: DC
  Administered 2022-09-24 – 2022-09-29 (×16): 1000 mg via ORAL
  Filled 2022-09-24 (×17): qty 2

## 2022-09-24 MED ORDER — SODIUM CHLORIDE 0.9% FLUSH: 3.0000 mL | INTRAVENOUS | Status: DC | PRN

## 2022-09-24 MED ORDER — PROPOFOL 10 MG/ML IV BOLUS
INTRAVENOUS | Status: AC
  Filled 2022-09-24: qty 20

## 2022-09-24 MED ORDER — LACTATED RINGERS IV SOLN: INTRAVENOUS | Status: DC

## 2022-09-24 MED ORDER — CHLORHEXIDINE GLUCONATE 0.12 % MT SOLN
15.0000 mL | OROMUCOSAL | Status: AC
  Administered 2022-09-24: 15 mL via OROMUCOSAL
  Filled 2022-09-24 (×2): qty 15

## 2022-09-24 MED ORDER — POLYVINYL ALCOHOL 1.4 % OP SOLN
1.0000 [drp] | Freq: Four times a day (QID) | OPHTHALMIC | Status: DC
  Administered 2022-09-25 – 2022-09-28 (×16): 1 [drp] via OPHTHALMIC
  Filled 2022-09-24 (×2): qty 15

## 2022-09-24 MED ORDER — 0.9 % SODIUM CHLORIDE (POUR BTL) OPTIME
TOPICAL | Status: DC | PRN
  Administered 2022-09-24: 5000 mL

## 2022-09-24 MED ORDER — PLASMA-LYTE A IV SOLN
INTRAVENOUS | Status: DC | PRN
  Administered 2022-09-24: 500 mL

## 2022-09-24 MED ORDER — ALBUMIN HUMAN 5 % IV SOLN
INTRAVENOUS | Status: DC | PRN
  Administered 2022-09-24: 12.5 g via INTRAVENOUS

## 2022-09-24 MED ORDER — PHENYLEPHRINE HCL-NACL 20-0.9 MG/250ML-% IV SOLN
0.0000 ug/min | INTRAVENOUS | Status: DC
  Administered 2022-09-24: 5 ug/min via INTRAVENOUS

## 2022-09-24 MED ORDER — DEXTROSE 50 % IV SOLN: 0.0000 mL | INTRAVENOUS | Status: DC | PRN

## 2022-09-24 MED ORDER — ~~LOC~~ CARDIAC SURGERY, PATIENT & FAMILY EDUCATION
Freq: Once | Status: DC
  Filled 2022-09-24: qty 1

## 2022-09-24 MED ORDER — LEVOTHYROXINE SODIUM 88 MCG PO TABS
88.0000 ug | ORAL_TABLET | Freq: Every day | ORAL | Status: DC
  Administered 2022-09-25 – 2022-09-28 (×4): 88 ug via ORAL
  Filled 2022-09-24 (×4): qty 1

## 2022-09-24 MED ORDER — ROCURONIUM BROMIDE 10 MG/ML (PF) SYRINGE
PREFILLED_SYRINGE | INTRAVENOUS | Status: DC | PRN
  Administered 2022-09-24: 50 mg via INTRAVENOUS
  Administered 2022-09-24: 80 mg via INTRAVENOUS
  Administered 2022-09-24: 70 mg via INTRAVENOUS
  Administered 2022-09-24: 30 mg via INTRAVENOUS

## 2022-09-24 MED ORDER — SODIUM CHLORIDE 0.9 % IV SOLN: INTRAVENOUS | Status: DC | PRN

## 2022-09-24 MED ORDER — CHLORHEXIDINE GLUCONATE CLOTH 2 % EX PADS
6.0000 | MEDICATED_PAD | Freq: Every day | CUTANEOUS | Status: DC
  Administered 2022-09-25 – 2022-09-26 (×2): 6 via TOPICAL

## 2022-09-24 MED ORDER — MIDAZOLAM HCL (PF) 10 MG/2ML IJ SOLN
INTRAMUSCULAR | Status: AC
  Filled 2022-09-24: qty 2

## 2022-09-24 MED ORDER — DEXAMETHASONE SODIUM PHOSPHATE 10 MG/ML IJ SOLN
INTRAMUSCULAR | Status: DC | PRN
  Administered 2022-09-24: 10 mg via INTRAVENOUS

## 2022-09-24 MED ORDER — MAGNESIUM SULFATE 4 GM/100ML IV SOLN
4.0000 g | Freq: Once | INTRAVENOUS | Status: AC
  Administered 2022-09-24: 4 g via INTRAVENOUS
  Filled 2022-09-24: qty 100

## 2022-09-24 MED ORDER — OXYCODONE HCL 5 MG PO TABS
5.0000 mg | ORAL_TABLET | ORAL | Status: DC | PRN
  Administered 2022-09-24: 5 mg via ORAL
  Administered 2022-09-24 – 2022-09-26 (×8): 10 mg via ORAL
  Administered 2022-09-27: 5 mg via ORAL
  Administered 2022-09-27: 10 mg via ORAL
  Administered 2022-09-28: 5 mg via ORAL
  Administered 2022-09-28: 10 mg via ORAL
  Administered 2022-09-29: 5 mg via ORAL
  Filled 2022-09-24 (×6): qty 2
  Filled 2022-09-24 (×3): qty 1
  Filled 2022-09-24: qty 2
  Filled 2022-09-24: qty 1
  Filled 2022-09-24 (×3): qty 2

## 2022-09-24 MED ORDER — METOPROLOL TARTRATE 12.5 MG HALF TABLET
12.5000 mg | ORAL_TABLET | Freq: Two times a day (BID) | ORAL | Status: DC
  Administered 2022-09-25 – 2022-09-29 (×9): 12.5 mg via ORAL
  Filled 2022-09-24 (×9): qty 1

## 2022-09-24 MED ORDER — ONDANSETRON HCL 4 MG/2ML IJ SOLN
4.0000 mg | Freq: Four times a day (QID) | INTRAMUSCULAR | Status: DC | PRN
  Administered 2022-09-24 (×2): 4 mg via INTRAVENOUS
  Filled 2022-09-24 (×2): qty 2

## 2022-09-24 MED ORDER — VANCOMYCIN HCL IN DEXTROSE 1-5 GM/200ML-% IV SOLN
1000.0000 mg | Freq: Once | INTRAVENOUS | Status: AC
  Administered 2022-09-24: 1000 mg via INTRAVENOUS
  Filled 2022-09-24: qty 200

## 2022-09-24 MED ORDER — CEFAZOLIN SODIUM-DEXTROSE 2-4 GM/100ML-% IV SOLN
INTRAVENOUS | Status: AC
  Filled 2022-09-24: qty 100

## 2022-09-24 MED ORDER — PANTOPRAZOLE SODIUM 40 MG PO TBEC
40.0000 mg | DELAYED_RELEASE_TABLET | Freq: Every day | ORAL | Status: DC
  Administered 2022-09-26 – 2022-09-29 (×4): 40 mg via ORAL
  Filled 2022-09-24 (×4): qty 1

## 2022-09-24 MED ORDER — DEXMEDETOMIDINE HCL IN NACL 400 MCG/100ML IV SOLN
0.0000 ug/kg/h | INTRAVENOUS | Status: DC
  Administered 2022-09-24: 0.7 ug/kg/h via INTRAVENOUS

## 2022-09-24 MED ORDER — CLEVIDIPINE BUTYRATE 0.5 MG/ML IV EMUL
INTRAVENOUS | Status: AC
  Filled 2022-09-24: qty 50

## 2022-09-24 MED ORDER — SODIUM CHLORIDE 0.9% FLUSH
3.0000 mL | Freq: Two times a day (BID) | INTRAVENOUS | Status: DC
  Administered 2022-09-25 – 2022-09-29 (×8): 3 mL via INTRAVENOUS

## 2022-09-24 MED ORDER — METOPROLOL TARTRATE 12.5 MG HALF TABLET
12.5000 mg | ORAL_TABLET | Freq: Once | ORAL | Status: AC
  Administered 2022-09-24: 12.5 mg via ORAL
  Filled 2022-09-24: qty 1

## 2022-09-24 MED ORDER — BISACODYL 5 MG PO TBEC
10.0000 mg | DELAYED_RELEASE_TABLET | Freq: Every day | ORAL | Status: DC
  Administered 2022-09-25 – 2022-09-28 (×3): 10 mg via ORAL
  Filled 2022-09-24 (×4): qty 2

## 2022-09-24 MED ORDER — PROTAMINE SULFATE 10 MG/ML IV SOLN
INTRAVENOUS | Status: DC | PRN
  Administered 2022-09-24: 210 mg via INTRAVENOUS

## 2022-09-24 MED ORDER — ALBUMIN HUMAN 5 % IV SOLN
250.0000 mL | INTRAVENOUS | Status: AC | PRN
  Administered 2022-09-24 (×2): 12.5 g via INTRAVENOUS

## 2022-09-24 MED ORDER — LACTATED RINGERS IV SOLN: 500.0000 mL | Freq: Once | INTRAVENOUS | Status: DC | PRN

## 2022-09-24 MED ORDER — CEFAZOLIN SODIUM-DEXTROSE 2-4 GM/100ML-% IV SOLN
2.0000 g | Freq: Three times a day (TID) | INTRAVENOUS | Status: AC
  Administered 2022-09-24 – 2022-09-26 (×6): 2 g via INTRAVENOUS
  Filled 2022-09-24 (×6): qty 100

## 2022-09-24 MED ORDER — SODIUM CHLORIDE 0.9 % IV SOLN: INTRAVENOUS | Status: DC

## 2022-09-24 MED ORDER — ACETAMINOPHEN 160 MG/5ML PO SOLN: 650.0000 mg | Freq: Once | ORAL | Status: AC

## 2022-09-24 MED ORDER — POTASSIUM CHLORIDE 10 MEQ/50ML IV SOLN
10.0000 meq | INTRAVENOUS | Status: AC
  Administered 2022-09-24 (×3): 10 meq via INTRAVENOUS

## 2022-09-24 SURGICAL SUPPLY — 90 items
ADAPTER CARDIO PERF ANTE/RETRO (ADAPTER) ×2 IMPLANT
BAG DECANTER FOR FLEXI CONT (MISCELLANEOUS) ×2 IMPLANT
BLADE CLIPPER SURG (BLADE) ×2 IMPLANT
BLADE STERNUM SYSTEM 6 (BLADE) ×2 IMPLANT
BLADE SURG 15 STRL LF DISP TIS (BLADE) ×2 IMPLANT
BLADE SURG 15 STRL SS (BLADE) ×2
CANISTER SUCT 3000ML PPV (MISCELLANEOUS) ×2 IMPLANT
CANNULA AORTIC ROOT 9FR (CANNULA) IMPLANT
CANNULA ARTERIAL VENT 3/8 20FR (CANNULA) IMPLANT
CANNULA GUNDRY RCSP 15FR (MISCELLANEOUS) ×2 IMPLANT
CANNULA MC2 2 STG 36/46 NON-V (CANNULA) IMPLANT
CANNULA VENOUS 2 STG 34/46 (CANNULA) ×2
CATH HEART VENT LEFT (CATHETERS) ×2 IMPLANT
CATH ROBINSON RED A/P 18FR (CATHETERS) ×6 IMPLANT
CATH THORACIC 36FR (CATHETERS) ×2 IMPLANT
CATH THORACIC 36FR RT ANG (CATHETERS) ×2 IMPLANT
CNTNR URN SCR LID CUP LEK RST (MISCELLANEOUS) ×2 IMPLANT
CONT SPEC 4OZ STRL OR WHT (MISCELLANEOUS) ×2
CONTAINER PROTECT SURGISLUSH (MISCELLANEOUS) ×4 IMPLANT
COVER SURGICAL LIGHT HANDLE (MISCELLANEOUS) ×2 IMPLANT
DEVICE SUT CK QUICK LOAD MINI (Prosthesis & Implant Heart) IMPLANT
DRAPE CARDIOVASCULAR INCISE (DRAPES) ×2
DRAPE SLUSH MACHINE 52X66 (DRAPES) IMPLANT
DRAPE SRG 135X102X78XABS (DRAPES) ×2 IMPLANT
DRAPE WARM FLUID 44X44 (DRAPES) ×2 IMPLANT
DRSG COVADERM 4X14 (GAUZE/BANDAGES/DRESSINGS) ×2 IMPLANT
ELECT CAUTERY BLADE 6.4 (BLADE) ×2 IMPLANT
ELECT REM PT RETURN 9FT ADLT (ELECTROSURGICAL) ×4
ELECT SOLID GEL RDN PRO-PADZ (MISCELLANEOUS) ×2
ELECTRODE REM PT RTRN 9FT ADLT (ELECTROSURGICAL) ×4 IMPLANT
ELECTRODE SOLI GEL RDN PROPADZ (MISCELLANEOUS) IMPLANT
FELT TEFLON 1X6 (MISCELLANEOUS) ×4 IMPLANT
GAUZE 4X4 16PLY ~~LOC~~+RFID DBL (SPONGE) ×2 IMPLANT
GAUZE SPONGE 4X4 12PLY STRL (GAUZE/BANDAGES/DRESSINGS) ×2 IMPLANT
GLOVE BIO SURGEON STRL SZ 6 (GLOVE) IMPLANT
GLOVE BIO SURGEON STRL SZ 6.5 (GLOVE) IMPLANT
GLOVE BIO SURGEON STRL SZ7 (GLOVE) IMPLANT
GLOVE BIO SURGEON STRL SZ7.5 (GLOVE) IMPLANT
GLOVE BIOGEL PI IND STRL 6 (GLOVE) IMPLANT
GLOVE BIOGEL PI IND STRL 7.5 (GLOVE) IMPLANT
GLOVE ECLIPSE 7.0 STRL STRAW (GLOVE) IMPLANT
GLOVE SS BIOGEL STRL SZ 7.5 (GLOVE) IMPLANT
GLOVE SURG MICRO LTX SZ7 (GLOVE) ×4 IMPLANT
GLOVE SURG SS PI 6.0 STRL IVOR (GLOVE) IMPLANT
GLOVE SURG SS PI 6.5 STRL IVOR (GLOVE) IMPLANT
GOWN STRL REUS W/ TWL LRG LVL3 (GOWN DISPOSABLE) ×8 IMPLANT
GOWN STRL REUS W/ TWL XL LVL3 (GOWN DISPOSABLE) ×2 IMPLANT
GOWN STRL REUS W/TWL LRG LVL3 (GOWN DISPOSABLE) ×16
GOWN STRL REUS W/TWL XL LVL3 (GOWN DISPOSABLE) ×2
HEMOSTAT POWDER SURGIFOAM 1G (HEMOSTASIS) ×6 IMPLANT
HEMOSTAT SURGICEL 2X14 (HEMOSTASIS) ×2 IMPLANT
KIT BASIN OR (CUSTOM PROCEDURE TRAY) ×2 IMPLANT
KIT CATH CPB BARTLE (MISCELLANEOUS) ×2 IMPLANT
KIT SUCTION CATH 14FR (SUCTIONS) ×2 IMPLANT
KIT SUT CK MINI COMBO 4X17 (Prosthesis & Implant Heart) IMPLANT
KIT TURNOVER KIT B (KITS) ×2 IMPLANT
LINE VENT (MISCELLANEOUS) IMPLANT
NS IRRIG 1000ML POUR BTL (IV SOLUTION) ×12 IMPLANT
PACK E OPEN HEART (SUTURE) ×2 IMPLANT
PACK OPEN HEART (CUSTOM PROCEDURE TRAY) ×2 IMPLANT
PAD ARMBOARD 7.5X6 YLW CONV (MISCELLANEOUS) ×4 IMPLANT
PENCIL BUTTON HOLSTER BLD 10FT (ELECTRODE) IMPLANT
POSITIONER HEAD DONUT 9IN (MISCELLANEOUS) ×2 IMPLANT
SET CARDIO DLP MULTI-PER 4-LEG (CARDIAC RHYTHM DISPOSABLE) IMPLANT
SET MPS 3-ND DEL (MISCELLANEOUS) IMPLANT
SPONGE T-LAP 18X18 ~~LOC~~+RFID (SPONGE) ×8 IMPLANT
SPONGE T-LAP 4X18 ~~LOC~~+RFID (SPONGE) ×2 IMPLANT
SUT BONE WAX W31G (SUTURE) ×2 IMPLANT
SUT EB EXC GRN/WHT 2-0 V-5 (SUTURE) ×4 IMPLANT
SUT ETHIBON EXCEL 2-0 V-5 (SUTURE) IMPLANT
SUT ETHIBOND 2 0 SH (SUTURE) ×2
SUT ETHIBOND 2 0 SH 36X2 (SUTURE) IMPLANT
SUT ETHIBOND V-5 VALVE (SUTURE) IMPLANT
SUT PROLENE 3 0 SH DA (SUTURE) IMPLANT
SUT PROLENE 3 0 SH1 36 (SUTURE) ×2 IMPLANT
SUT PROLENE 4 0 RB 1 (SUTURE) ×6
SUT PROLENE 4-0 RB1 .5 CRCL 36 (SUTURE) ×6 IMPLANT
SUT STEEL 6MS V (SUTURE) IMPLANT
SUT VIC AB 1 CTX 36 (SUTURE) ×4
SUT VIC AB 1 CTX36XBRD ANBCTR (SUTURE) ×4 IMPLANT
SYSTEM SAHARA CHEST DRAIN ATS (WOUND CARE) ×2 IMPLANT
TAPE PAPER 2X10 WHT MICROPORE (GAUZE/BANDAGES/DRESSINGS) IMPLANT
TOWEL GREEN STERILE (TOWEL DISPOSABLE) ×2 IMPLANT
TOWEL GREEN STERILE FF (TOWEL DISPOSABLE) ×2 IMPLANT
TRAY FOLEY SLVR 16FR TEMP STAT (SET/KITS/TRAYS/PACK) ×2 IMPLANT
TUBE SUCT INTRACARD DLP 20F (MISCELLANEOUS) IMPLANT
UNDERPAD 30X36 HEAVY ABSORB (UNDERPADS AND DIAPERS) ×2 IMPLANT
VALVE AORTIC SZ23 INSP/RESIL (Prosthesis & Implant Heart) IMPLANT
VENT LEFT HEART 12002 (CATHETERS) ×2
WATER STERILE IRR 1000ML POUR (IV SOLUTION) ×4 IMPLANT

## 2022-09-24 NOTE — Op Note (Signed)
CARDIOVASCULAR SURGERY OPERATIVE NOTE  09/24/2022 Theresa [name] [id number]  Surgeon:  Gaye Pollack, MD  First Assistant: Jadene Pierini, PA-C:  An experienced assistant was required given the complexity of this surgery and the standard of surgical care. The assistant was needed for exposure, dissection, suctioning, retraction of delicate tissues and sutures, instrument exchange and for overall help during this procedure.    Preoperative Diagnosis:  Quadracuspid aortic valve with severe AI   Postoperative Diagnosis:  Same   Procedure:  Median Sternotomy Extracorporeal circulation 3.   Aortic valve replacement using a 23 mm Edwards INSPIRIS RESILIA pericardial valve.  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  I have personally reviewed her 2D echocardiogram and reviewed the images with her and her husband.  Her valve leaflets are thickened with lack of central coaptation and central regurgitation.  Her left ventricular systolic function is normal with mild LV dilation at 4.9 cm which has not significantly changed over the past several years.  Her aortic insufficiency visually looks worse than it did on her echo 1 year ago although the pressure half-time is unchanged.  She would like to proceed with surgery to prevent further LV dysfunction.   Preparation:  The patient was seen in the preoperative holding area and the correct patient, correct operation were confirmed with the patient after reviewing the medical record and catheterization. The consent was signed by me. Preoperative antibiotics were given. A pulmonary arterial line and radial arterial line were placed by the anesthesia team. The patient was taken back to the operating room and positioned supine on the operating room table. After being placed under general  endotracheal anesthesia by the anesthesia team a foley catheter was placed. The neck, chest, abdomen, and both legs were prepped with betadine soap and solution and draped in the usual sterile manner. A surgical time-out was taken and the correct patient and operative procedure were confirmed with the nursing and anesthesia staff.   Pre-bypass TEE:   Complete TEE assessment was performed by Dr. Kalman Shan. This showed a quadracuspid aortic valve with severe AI. Normal LV systolic function.    Post-bypass TEE:   Normal functioning prosthetic aortic valve with no perivalvular leak or regurgitation through the valve. Left ventricular function preserved. No mitral regurgitation.    Cardiopulmonary Bypass:  A median sternotomy was performed. The pericardium was opened in the midline. Right ventricular function appeared normal. The ascending aorta was of normal size and had no palpable plaque. There were no contraindications to aortic cannulation or cross-clamping. The patient was fully systemically heparinized and the ACT was maintained > 400 sec. The proximal aortic arch was cannulated with a 20 F aortic cannula for arterial inflow. Venous cannulation was performed via the right atrial appendage using a two-staged venous cannula. An antegrade cardioplegia/vent cannula was inserted into the mid-ascending aorta. A left ventricular vent was placed via the right superior pulmonary vein. A retrograde cardioplegia cannnula was placed into the coronary sinus via the right atrium. Aortic occlusion was performed with a single cross-clamp. Systemic cooling to 32 degrees Centigrade and topical cooling of the heart with iced saline were used. Cold retrograde KBC cardioplegia was used to induce diastolic arrest and then warm retrograde KBC reanimation cardioplegia was given prior to removing the crossclamp. A temperature probe was inserted into the interventricular septum and an insulating pad was placed in the  pericardium.  Aortic Valve Replacement:  ( see Media section for pics of valve)  A transverse aortotomy was performed 1 cm above the take-off of the right coronary artery. The native valve was quadracuspid with thickened leaflets that did not coapt and no annular calcification. There were fenestrations at the commissures.The ostia of the coronary arteries were in normal position and were not obstructed. The native valve leaflets were excised. Care was taken to remove all particulate debris. The left ventricle was directly inspected for debris and then irrigated with ice saline solution. The annulus was sized and a size 23 mm  INSPIRIS RESILIA pericardial valve was chosen. The model number was [id number] and the serial number was [id number]. While the valve was being prepared 2-0 Ethibond pledgeted horizontal mattress sutures were placed around the annulus with the pledgets in a sub-annular position. The sutures were placed through the sewing ring and the valve lowered into place. The sutures were tied using the CorKnot system. The valve seated nicely and the coronary ostia were not obstructed. The prosthetic valve leaflets moved normally and there was no sub-valvular obstruction. The aortotomy was closed using 4-0 Prolene suture in 2 layers with felt strips to reinforce the closure.      Completion:  The patient was rewarmed to 37 degrees Centigrade. De-airing maneuvers were performed and the head placed in trendelenburg position. The crossclamp was removed with a time of 65 minutes. There was spontaneous return of sinus rhythm. The aortotomy was checked for hemostasis. Two temporary epicardial pacing wires were placed on the right atrium and two on the right ventricle. The left ventricular vent and retrograde cardioplegia cannulas were removed. The patient was weaned from CPB without difficulty on no inotropes. CPB time was 88 minutes. Cardiac output was 5 LPM. Heparin was fully reversed with protamine and  the aortic and venous cannulas removed. Hemostasis was achieved. Mediastinal drainage tubes were placed. The sternum was closed with  #6 stainless steel wires. The fascia was closed with continuous # 1 vicryl suture. The subcutaneous tissue was closed with 2-0 vicryl continuous suture. The skin was closed with 3-0 vicryl subcuticular suture. All sponge, needle, and instrument counts were reported correct at the end of the case. Dry sterile dressings were placed over the incisions and around the chest tubes which were connected to pleurevac suction. The patient was then transported to the surgical intensive care unit in stable condition.

## 2022-09-24 NOTE — Anesthesia Procedure Notes (Signed)
Procedure Name: Intubation Date/Time: 09/24/2022 7:47 AM  Performed by: Mariea Clonts, CRNAPre-anesthesia Checklist: Patient identified, Emergency Drugs available, Suction available and Patient being monitored Patient Re-evaluated:Patient Re-evaluated prior to induction Oxygen Delivery Method: Circle System Utilized Preoxygenation: Pre-oxygenation with 100% oxygen Induction Type: IV induction Ventilation: Mask ventilation without difficulty Laryngoscope Size: Mac and 3 Grade View: Grade II Tube type: Oral Tube size: 7.5 mm Number of attempts: 2 Airway Equipment and Method: Stylet Placement Confirmation: ETT inserted through vocal cords under direct vision, positive ETCO2 and breath sounds checked- equal and bilateral Tube secured with: Tape Dental Injury: Teeth and Oropharynx as per pre-operative assessment

## 2022-09-24 NOTE — Transfer of Care (Signed)
Immediate Anesthesia Transfer of Care Note  Patient: Theresa [name]  Procedure(s) Performed: AORTIC VALVE REPLACEMENT (AVR) USING EDWARDS INSPIRIS RESILIA 23 MM AORTIC VALVE (Chest) TRANSESOPHAGEAL ECHOCARDIOGRAM (TEE)  Patient Location: ICU  Anesthesia Type:General  Level of Consciousness: sedated and unresponsive  Airway & Oxygen Therapy: Patient remains intubated per anesthesia plan and Patient placed on Ventilator (see vital sign flow sheet for setting)  Post-op Assessment: Report given to RN and Post -op Vital signs reviewed and stable  Post vital signs: Reviewed and stable  Last Vitals:  Vitals Value Taken Time  BP    Temp 35.7 C 09/24/22 1222  Pulse 80 09/24/22 1222  Resp 12 09/24/22 1222  SpO2 100 % 09/24/22 1222  Vitals shown include unvalidated device data.  Last Pain:  Vitals:   09/24/22 0612  TempSrc: Oral  PainSc:          Complications: No notable events documented.

## 2022-09-24 NOTE — Anesthesia Postprocedure Evaluation (Signed)
Anesthesia Post Note  Patient: Theresa [name]  Procedure(s) Performed: AORTIC VALVE REPLACEMENT (AVR) USING EDWARDS INSPIRIS RESILIA 23 MM AORTIC VALVE (Chest) TRANSESOPHAGEAL ECHOCARDIOGRAM (TEE)     Patient location during evaluation: SICU Anesthesia Type: General Level of consciousness: sedated Pain management: pain level controlled Vital Signs Assessment: post-procedure vital signs reviewed and stable Respiratory status: patient remains intubated per anesthesia plan Cardiovascular status: stable Postop Assessment: no apparent nausea or vomiting Anesthetic complications: no  No notable events documented.  Last Vitals:  Vitals:   09/24/22 1330 09/24/22 1345  BP:    Pulse: 80 80  Resp: 12 12  Temp: (!) 35.6 C (!) 35.6 C  SpO2: 100% 100%    Last Pain:  Vitals:   09/24/22 1200  TempSrc: Core  PainSc: 0-No pain                 Emmalise Huard S

## 2022-09-24 NOTE — Anesthesia Preprocedure Evaluation (Signed)
Anesthesia Evaluation  Patient identified by MRN, date of birth, ID band Patient awake    Reviewed: Allergy & Precautions, H&P , NPO status , Patient's Chart, lab work & pertinent test results  Airway Mallampati: II  TM Distance: >3 FB Neck ROM: Full    Dental no notable dental hx.    Pulmonary neg pulmonary ROS   Pulmonary exam normal breath sounds clear to auscultation       Cardiovascular + Valvular Problems/Murmurs AI  Rhythm:Regular Rate:Normal + Systolic murmurs Left Ventricle: Left ventricular ejection fraction, by estimation, is 55  to 60%. The left ventricle has normal function. The left ventricle has no  regional wall motion abnormalities. The average left ventricular global  longitudinal strain is -22.3 %.  The global longitudinal strain is normal. The left ventricular internal  cavity size was mildly dilated. There is no left ventricular hypertrophy.  Left ventricular diastolic parameters were normal.   Right Ventricle: The right ventricular size is normal. No increase in  right ventricular wall thickness. Right ventricular systolic function is  normal.   Left Atrium: Left atrial size was mildly dilated.   Right Atrium: Right atrial size was normal in size.   Pericardium: There is no evidence of pericardial effusion.   Mitral Valve: The mitral valve is abnormal. There is moderate thickening  of the mitral valve leaflet(s). Mild mitral valve regurgitation. No  evidence of mitral valve stenosis.   Tricuspid Valve: The tricuspid valve is normal in structure. Tricuspid  valve regurgitation is trivial. No evidence of tricuspid stenosis.   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 PT1/2 not very  short. Near holosystolic reversal in arch Color flow > 50% of the LvOT  diameter Images on current study better than 05/14/21 but overall degree of  AR looks worse. The aortic valve is  abnormal. Aortic valve  regurgitation is not visualized. Aortic  regurgitation PHT measures 551 msec. No aortic stenosis is present.   Pulmonic Valve: The pulmonic valve was normal in structure. Pulmonic valve  regurgitation is not visualized. No evidence of pulmonic stenosis.   Aorta: The aortic root is normal in size and structure.   Venous: The inferior vena cava is normal in size with greater than 50%  respiratory variability, suggesting right atrial pressure of 3 mmHg.     Neuro/Psych negative neurological ROS  negative psych ROS   GI/Hepatic negative GI ROS, Neg liver ROS,,,  Endo/Other  Hypothyroidism    Renal/GU negative Renal ROS  negative genitourinary   Musculoskeletal negative musculoskeletal ROS (+)    Abdominal   Peds negative pediatric ROS (+)  Hematology negative hematology ROS (+)   Anesthesia Other Findings   Reproductive/Obstetrics negative OB ROS                             Anesthesia Physical Anesthesia Plan  ASA: 3  Anesthesia Plan: General   Post-op Pain Management: Minimal or no pain anticipated   Induction: Intravenous  PONV Risk Score and Plan: 3 and Ondansetron, Dexamethasone and Treatment may vary due to age or medical condition  Airway Management Planned: Oral ETT  Additional Equipment: Arterial line, CVP, PA Cath, TEE and Ultrasound Guidance Line Placement  Intra-op Plan:   Post-operative Plan: Post-operative intubation/ventilation  Informed Consent: I have reviewed the patients History and Physical, chart, labs and discussed the procedure including the risks, benefits and alternatives for the proposed anesthesia with the patient or authorized  representative who has indicated his/her understanding and acceptance.     Dental advisory given  Plan Discussed with: CRNA and Surgeon  Anesthesia Plan Comments:        Anesthesia Quick Evaluation

## 2022-09-24 NOTE — Procedures (Signed)
Extubation Procedure Note  Patient Details:   Name: Theresa [name] DOB: [birth year] MRN: [id number]   Airway Documentation:    Vent end date: 09/24/22 Vent end time: 1620   Evaluation  O2 sats: stable throughout Complications: No apparent complications Patient did tolerate procedure well. Bilateral Breath Sounds: Clear, Diminished   Yes  NIF-30 VC 1.2L Pt had good effort.    Pt extubated to [place] with RN x2 at bedside. Positive cuff leak noted and pt is tolerating well. RT will monitor.  Cathie Olden 09/24/2022, 4:25 PM

## 2022-09-24 NOTE — Interval H&P Note (Signed)
History and Physical Interval Note:  09/24/2022 6:39 AM  Theresa [name]  has presented today for surgery, with the diagnosis of SEVERE AI.  The various methods of treatment have been discussed with the patient and family. After consideration of risks, benefits and other options for treatment, the patient has consented to  Procedure(s): AORTIC VALVE REPLACEMENT (AVR) (N/A) TRANSESOPHAGEAL ECHOCARDIOGRAM (TEE) (N/A) as a surgical intervention.  The patient's history has been reviewed, patient examined, no change in status, stable for surgery.  I have reviewed the patient's chart and labs.  Questions were answered to the patient's satisfaction.     Gaye Pollack

## 2022-09-24 NOTE — Anesthesia Procedure Notes (Signed)
Arterial Line Insertion Start/End1/08/2023 7:00 AM, 09/24/2022 7:15 AM Performed by: Myrtie Soman, MD, Kriste Basque, RN  Patient location: Pre-op. Preanesthetic checklist: patient identified, IV checked, site marked, risks and benefits discussed, surgical consent, monitors and equipment checked, pre-op evaluation, timeout performed and anesthesia consent Lidocaine 1% used for infiltration and patient sedated Left, radial was placed Catheter size: 20 G Hand hygiene performed  and maximum sterile barriers used  Allen's test indicative of satisfactory collateral circulation Attempts: 1 Procedure performed without using ultrasound guided technique. Following insertion, dressing applied and Biopatch. Post procedure assessment: normal  Patient tolerated the procedure well with no immediate complications.

## 2022-09-24 NOTE — Brief Op Note (Signed)
09/24/2022  1:47 PM  PATIENT:  Theresa [name]  67 y.o. female  PRE-OPERATIVE DIAGNOSIS:  SEVERE AORTIC INSUFFICIENCY  POST-OPERATIVE DIAGNOSIS:  SEVERE AORTIC INSUFFICIENCY  PROCEDURE:  Procedure(s) with comments: AORTIC VALVE REPLACEMENT (AVR) USING EDWARDS INSPIRIS RESILIA 23 MM AORTIC VALVE (N/A) - Median sternotomy TRANSESOPHAGEAL ECHOCARDIOGRAM (TEE) (N/A)  SURGEON:  Surgeon(s) and Role:    * Bartle, Fernande Boyden, MD - Primary  PHYSICIAN ASSISTANT: Dallin Mccorkel PA-C  ASSISTANTS: RNFA STAFF   ANESTHESIA:   general  EBL:  927 mL   BLOOD ADMINISTERED:none  DRAINS:  LEFT PLEURAL AND MEDIASITNAL CHEST TUBES    LOCAL MEDICATIONS USED:  NONE  SPECIMEN:  Source of Specimen:  AORTIC VALVE LEAFLETS  DISPOSITION OF SPECIMEN:  PATHOLOGY  COUNTS:  YES  TOURNIQUET:  * No tourniquets in log *  DICTATION: .Dragon Dictation  PLAN OF CARE: Admit to inpatient   PATIENT DISPOSITION:  ICU - intubated and hemodynamically stable.   Delay start of Pharmacological VTE agent (>24hrs) due to surgical blood loss or risk of bleeding: yes  COMPLICATIONS: NO KNOWN

## 2022-09-24 NOTE — Anesthesia Procedure Notes (Signed)
Central Venous Catheter Insertion Performed by: Myrtie Soman, MD, anesthesiologist Start/End1/08/2023 6:52 AM, 09/24/2022 7:15 AM Patient location: Pre-op. Preanesthetic checklist: patient identified, IV checked, site marked, risks and benefits discussed, surgical consent, monitors and equipment checked, pre-op evaluation, timeout performed and anesthesia consent Position: Trendelenburg Lidocaine 1% used for infiltration and patient sedated Hand hygiene performed  and maximum sterile barriers used  Catheter size: 8.5 Fr PA cath was placed.Sheath introducer Swan type:thermodilution PA Cath depth:42 Procedure performed using ultrasound guided technique. Ultrasound Notes:anatomy identified, needle tip was noted to be adjacent to the nerve/plexus identified, no ultrasound evidence of intravascular and/or intraneural injection and image(s) printed for medical record Attempts: 1 Following insertion, line sutured, dressing applied and Biopatch. Post procedure assessment: blood return through all ports, free fluid flow and no air  Patient tolerated the procedure well with no immediate complications.

## 2022-09-24 NOTE — Progress Notes (Signed)
[address]       [place] [address]             [phone]       EVENING ROUNDS  POD #0 SP AVR Extubated Hemodynamics excellent Not bleeding

## 2022-09-24 NOTE — Hospital Course (Addendum)
  PCP is Crecencio Mc, MD Referring Provider is Fay Records, MD   HPI:   The patient is a 67 year old woman with a long history of a heart murmur and a quadricuspid aortic valve diagnosed in 2018.  2D echocardiogram at that time showed moderate aortic insufficiency with a pressure half-time of 513 ms.  Left ventricular ejection fraction was 55 to 60% with an LV diastolic dimension of 5.1 cm.  Her aortic insufficiency was unchanged on echocardiogram in January 2019 and August 8185 but the diastolic dimension was decreased at 4.6 cm.  Her most recent echo on 06/28/2022 was felt to show moderate to severe aortic insufficiency with near holosystolic reversal in arch color flow although the AI pressure half-time was still 551 ms.  Left ventricular ejection fraction is unchanged at 55 to 60% and the LV diastolic diameter was 4.9 cm.   She lives with her husband.  They are very active walking daily and she has not noticed any shortness of breath or fatigue.  She denies any peripheral edema.  She has had no orthopnea or PND.  She denies any chest pain or pressure.  Her main problems have been orthopedic with multiple spine surgeries, left total hip replacement, and a right knee meniscus repair.  The patient and all relevant studies were reviewed by Dr. Cyndia Bent who recommended proceeding with elective aortic valve replacement.  Hospital course:  The patient was admitted and on 09/24/2022 taken to the operating room at which time she underwent aortic valve replacement with #23 Inspiris bioprosthetic.  She tolerated the procedure well and was taken to the surgical intensive care unit in stable condition.  Postoperative hospital course:  Patient has done well.  She was extubated using standard post cardiac surgical protocols without difficulty.  She has maintained stable hemodynamics and normal sinus rhythm.  All routine lines, monitors and drainage devices have been discontinued in the standard fashion.   she was  started on a course of diuretics for expected postoperative volume overload.  She has maintained normal renal function.  She does have a mild expected acute blood loss anemia.  She did develop postoperative atrial fibrillation.  She was treated with amiodarone and beta-blocker.  She chemically converted to sinus rhythm.  Her TSH was noted to be a little bit high.  We adjusted her levothyroxine dose.  Incisions noted to be healing well without evidence of infection.  She was tolerating diet and routine cardiac rehab modalities.  Oxygen was weaned and maintaining good saturations on room air.  Overall,  at the time of discharge the patient was felt to be quite stable.

## 2022-09-25 ENCOUNTER — Inpatient Hospital Stay (HOSPITAL_COMMUNITY): Payer: Medicare Other

## 2022-09-25 LAB — MAGNESIUM
Magnesium: 2.4 mg/dL (ref 1.7–2.4)
Magnesium: 2 mg/dL (ref 1.7–2.4)

## 2022-09-25 LAB — BASIC METABOLIC PANEL
Anion gap: 7 (ref 5–15)
Anion gap: 8 (ref 5–15)
BUN: 9 mg/dL (ref 8–23)
BUN: 8 mg/dL (ref 8–23)
CO2: 24 mmol/L (ref 22–32)
CO2: 27 mmol/L (ref 22–32)
Calcium: 7.7 mg/dL — ABNORMAL LOW (ref 8.9–10.3)
Calcium: 7.7 mg/dL — ABNORMAL LOW (ref 8.9–10.3)
Chloride: 105 mmol/L (ref 98–111)
Chloride: 98 mmol/L (ref 98–111)
Creatinine, Ser: 0.62 mg/dL (ref 0.44–1.00)
Creatinine, Ser: 0.63 mg/dL (ref 0.44–1.00)
GFR, Estimated: >60 mL/min (ref >60)
GFR, Estimated: >60 mL/min (ref >60)
Glucose, Bld: 117 mg/dL — ABNORMAL HIGH (ref 70–99)
Glucose, Bld: 126 mg/dL — ABNORMAL HIGH (ref 70–99)
Potassium: 3.9 mmol/L (ref 3.5–5.1)
Potassium: 3.3 mmol/L — ABNORMAL LOW (ref 3.5–5.1)
Sodium: 136 mmol/L (ref 135–145)
Sodium: 133 mmol/L — ABNORMAL LOW (ref 135–145)

## 2022-09-25 LAB — GLUCOSE, CAPILLARY
Glucose-Capillary: 116 mg/dL — ABNORMAL HIGH (ref 70–99)
Glucose-Capillary: 117 mg/dL — ABNORMAL HIGH (ref 70–99)
Glucose-Capillary: 121 mg/dL — ABNORMAL HIGH (ref 70–99)
Glucose-Capillary: 127 mg/dL — ABNORMAL HIGH (ref 70–99)
Glucose-Capillary: 127 mg/dL — ABNORMAL HIGH (ref 70–99)
Glucose-Capillary: 132 mg/dL — ABNORMAL HIGH (ref 70–99)
Glucose-Capillary: 134 mg/dL — ABNORMAL HIGH (ref 70–99)
Glucose-Capillary: 134 mg/dL — ABNORMAL HIGH (ref 70–99)
Glucose-Capillary: 140 mg/dL — ABNORMAL HIGH (ref 70–99)

## 2022-09-25 LAB — CBC
HCT: 32.8 % — ABNORMAL LOW (ref 36.0–46.0)
HCT: 32.5 % — ABNORMAL LOW (ref 36.0–46.0)
Hemoglobin: 11.4 g/dL — ABNORMAL LOW (ref 12.0–15.0)
Hemoglobin: 11.1 g/dL — ABNORMAL LOW (ref 12.0–15.0)
MCH: 31.7 pg (ref 26.0–34.0)
MCH: 31.4 pg (ref 26.0–34.0)
MCHC: 34.8 g/dL (ref 30.0–36.0)
MCHC: 34.2 g/dL (ref 30.0–36.0)
MCV: 91.1 fL (ref 80.0–100.0)
MCV: 92.1 fL (ref 80.0–100.0)
Platelets: 184 K/uL (ref 150–400)
Platelets: 189 10*3/uL (ref 150–400)
RBC: 3.6 MIL/uL — ABNORMAL LOW (ref 3.87–5.11)
RBC: 3.53 MIL/uL — ABNORMAL LOW (ref 3.87–5.11)
RDW: 12.4 % (ref 11.5–15.5)
RDW: 12.6 % (ref 11.5–15.5)
WBC: 12.9 K/uL — ABNORMAL HIGH (ref 4.0–10.5)
WBC: 12.6 10*3/uL — ABNORMAL HIGH (ref 4.0–10.5)
nRBC: 0 % (ref 0.0–0.2)
nRBC: 0 % (ref 0.0–0.2)

## 2022-09-25 MED ORDER — FUROSEMIDE 10 MG/ML IJ SOLN
40.0000 mg | Freq: Once | INTRAMUSCULAR | Status: AC
  Administered 2022-09-25: 40 mg via INTRAVENOUS
  Filled 2022-09-25: qty 4

## 2022-09-25 MED ORDER — INSULIN ASPART 100 UNIT/ML IJ SOLN
0.0000 [IU] | INTRAMUSCULAR | Status: DC
  Administered 2022-09-25 – 2022-09-26 (×5): 2 [IU] via SUBCUTANEOUS

## 2022-09-25 MED ORDER — ENOXAPARIN SODIUM 40 MG/0.4ML IJ SOSY
40.0000 mg | PREFILLED_SYRINGE | Freq: Every day | INTRAMUSCULAR | Status: DC
  Administered 2022-09-25 – 2022-09-28 (×4): 40 mg via SUBCUTANEOUS
  Filled 2022-09-25 (×4): qty 0.4

## 2022-09-25 MED ORDER — GABAPENTIN 100 MG PO CAPS
200.0000 mg | ORAL_CAPSULE | Freq: Three times a day (TID) | ORAL | Status: DC
  Administered 2022-09-25 – 2022-09-29 (×13): 200 mg via ORAL
  Filled 2022-09-25 (×13): qty 2

## 2022-09-25 MED ORDER — POTASSIUM CHLORIDE 10 MEQ/50ML IV SOLN
10.0000 meq | INTRAVENOUS | Status: AC
  Administered 2022-09-25 – 2022-09-26 (×6): 10 meq via INTRAVENOUS
  Filled 2022-09-25 (×6): qty 50

## 2022-09-25 NOTE — Progress Notes (Signed)
[address]       [place] [address]             [phone]      1 Day Post-Op  Procedure(s) (LRB): AORTIC VALVE REPLACEMENT (AVR) USING EDWARDS INSPIRIS RESILIA 23 MM AORTIC VALVE (N/A) TRANSESOPHAGEAL ECHOCARDIOGRAM (TEE) (N/A)  Total Length of Stay:  LOS: 1 day   SUBJECTIVE: Pt feels well. Has been up in chair without issues Pain well controlled  Vitals:   09/25/22 0900 09/25/22 0915  BP:    Pulse: 76 76  Resp: 18 (!) 36  Temp:    SpO2: 97% 98%    Intake/Output      01/12 0701 01/13 0700 01/13 0701 01/14 0700   I.V. (mL/kg) 3027.5 (43.2) 77.5 (1.1)   Blood 510    IV Piggyback 1715.5 0   Total Intake(mL/kg) 5253 (75) 77.5 (1.1)   Urine (mL/kg/hr) 2117 (1.3) 85 (0.5)   Blood 927    Chest Tube 335 70   Total Output 3379 155   Net +1874 -77.5            sodium chloride Stopped (09/25/22 0852)   sodium chloride     sodium chloride 10 mL/hr at 09/25/22 0900   albumin human 60 mL/hr at 09/25/22 0800    ceFAZolin (ANCEF) IV Stopped (09/25/22 0619)   dexmedetomidine (PRECEDEX) IV infusion Stopped (09/24/22 1459)   insulin Stopped (09/25/22 0852)   lactated ringers     lactated ringers     lactated ringers Stopped (09/25/22 0852)   nitroGLYCERIN     phenylephrine (NEO-SYNEPHRINE) Adult infusion Stopped (09/24/22 1543)    CBC    Component Value Date/Time   WBC 12.9 (H) 09/25/2022 0311   RBC 3.60 (L) 09/25/2022 0311   HGB 11.4 (L) 09/25/2022 0311   HGB 13.1 08/27/2022 1518   HCT 32.8 (L) 09/25/2022 0311   HCT 38.6 08/27/2022 1518   PLT 184 09/25/2022 0311   PLT 252 08/27/2022 1518   MCV 91.1 09/25/2022 0311   MCV 94 08/27/2022 1518   MCH 31.7 09/25/2022 0311   MCHC 34.8 09/25/2022 0311   RDW 12.4 09/25/2022 0311   RDW 13.0 08/27/2022 1518   LYMPHSABS 1,170 07/21/2022 0733   MONOABS 704 01/28/2017 0900   EOSABS 170 07/21/2022 0733   BASOSABS 70 07/21/2022 0733   CMP     Component Value Date/Time   NA 136 09/25/2022  0311   NA 143 08/27/2022 1518   K 3.9 09/25/2022 0311   CL 105 09/25/2022 0311   CO2 24 09/25/2022 0311   GLUCOSE 117 (H) 09/25/2022 0311   BUN 9 09/25/2022 0311   BUN 9 08/27/2022 1518   CREATININE 0.62 09/25/2022 0311   CREATININE 0.71 07/21/2022 0733   CALCIUM 7.7 (L) 09/25/2022 0311   PROT 6.9 09/22/2022 1137   ALBUMIN 4.2 09/22/2022 1137   AST 26 09/22/2022 1137   ALT 19 09/22/2022 1137   ALKPHOS 56 09/22/2022 1137   BILITOT 0.7 09/22/2022 1137   GFRNONAA >60 09/25/2022 0311   GFRAA >60 08/26/2017 1024   ABG    Component Value Date/Time   PHART 7.292 (L) 09/24/2022 1722   PCO2ART 42.0 09/24/2022 1722   PO2ART 89 09/24/2022 1722   HCO3 20.4 09/24/2022 1722   TCO2 22 09/24/2022 1722   ACIDBASEDEF 6.0 (H) 09/24/2022 1722   O2SAT 96 09/24/2022 1722   CBG (last 3)  Recent Labs    09/25/22 0504 09/25/22 2202  09/25/22 0915  GLUCAP 117* 127* 127*  EXAM Lungs: clear Card: RR without murmur Ext: warm Neuro: alert and oriented   ASSESSMENT: POD #1 SP AVR (tissue valve) Hemodynamics good. Will dc swan and aline NSR: start bb Leave CT for now Sliding scale coverage Leave in unit today Gabepentin restart for neck pain   Coralie Common, MD 09/25/2022

## 2022-09-26 ENCOUNTER — Inpatient Hospital Stay (HOSPITAL_COMMUNITY): Payer: Medicare Other

## 2022-09-26 LAB — BASIC METABOLIC PANEL
Anion gap: 7 (ref 5–15)
BUN: 7 mg/dL — ABNORMAL LOW (ref 8–23)
CO2: 27 mmol/L (ref 22–32)
Calcium: 8.2 mg/dL — ABNORMAL LOW (ref 8.9–10.3)
Chloride: 100 mmol/L (ref 98–111)
Creatinine, Ser: 0.59 mg/dL (ref 0.44–1.00)
GFR, Estimated: >60 mL/min (ref >60)
Glucose, Bld: 116 mg/dL — ABNORMAL HIGH (ref 70–99)
Potassium: 4.6 mmol/L (ref 3.5–5.1)
Sodium: 134 mmol/L — ABNORMAL LOW (ref 135–145)

## 2022-09-26 LAB — CBC
HCT: 32.3 % — ABNORMAL LOW (ref 36.0–46.0)
Hemoglobin: 10.9 g/dL — ABNORMAL LOW (ref 12.0–15.0)
MCH: 31.2 pg (ref 26.0–34.0)
MCHC: 33.7 g/dL (ref 30.0–36.0)
MCV: 92.6 fL (ref 80.0–100.0)
Platelets: 175 10*3/uL (ref 150–400)
RBC: 3.49 MIL/uL — ABNORMAL LOW (ref 3.87–5.11)
RDW: 12.7 % (ref 11.5–15.5)
WBC: 14.1 10*3/uL — ABNORMAL HIGH (ref 4.0–10.5)
nRBC: 0 % (ref 0.0–0.2)

## 2022-09-26 LAB — GLUCOSE, CAPILLARY
Glucose-Capillary: 108 mg/dL — ABNORMAL HIGH (ref 70–99)
Glucose-Capillary: 135 mg/dL — ABNORMAL HIGH (ref 70–99)
Glucose-Capillary: 103 mg/dL — ABNORMAL HIGH (ref 70–99)
Glucose-Capillary: 123 mg/dL — ABNORMAL HIGH (ref 70–99)
Glucose-Capillary: 86 mg/dL (ref 70–99)

## 2022-09-26 MED ORDER — POTASSIUM CHLORIDE CRYS ER 20 MEQ PO TBCR
20.0000 meq | EXTENDED_RELEASE_TABLET | Freq: Every day | ORAL | Status: DC
  Administered 2022-09-26 – 2022-09-27 (×2): 20 meq via ORAL
  Filled 2022-09-26 (×2): qty 1

## 2022-09-26 MED ORDER — SODIUM CHLORIDE 0.9 % IV SOLN: 250.0000 mL | INTRAVENOUS | Status: DC | PRN

## 2022-09-26 MED ORDER — FUROSEMIDE 40 MG PO TABS
40.0000 mg | ORAL_TABLET | Freq: Every day | ORAL | Status: DC
  Administered 2022-09-26 – 2022-09-27 (×2): 40 mg via ORAL
  Filled 2022-09-26 (×2): qty 1

## 2022-09-26 MED ORDER — ~~LOC~~ CARDIAC SURGERY, PATIENT & FAMILY EDUCATION: Freq: Once | Status: AC

## 2022-09-26 MED ORDER — SODIUM CHLORIDE 0.9% FLUSH: 3.0000 mL | INTRAVENOUS | Status: DC | PRN

## 2022-09-26 MED ORDER — SODIUM CHLORIDE 0.9% FLUSH
3.0000 mL | Freq: Two times a day (BID) | INTRAVENOUS | Status: DC
  Administered 2022-09-26 (×2): 3 mL via INTRAVENOUS

## 2022-09-26 MED ORDER — INSULIN ASPART 100 UNIT/ML IJ SOLN: 0.0000 [IU] | Freq: Three times a day (TID) | INTRAMUSCULAR | Status: DC

## 2022-09-26 NOTE — Progress Notes (Signed)
Patient arrived from [facility] to 4e16, patient placed on monitor and vital signs obtained. Call bell within reach. Jayda White, Bettina Gavia RN

## 2022-09-26 NOTE — Progress Notes (Signed)
[address]       [place] [address]             [phone]      2 Days Post-Op  Procedure(s) (LRB): AORTIC VALVE REPLACEMENT (AVR) USING EDWARDS INSPIRIS RESILIA 23 MM AORTIC VALVE (N/A) TRANSESOPHAGEAL ECHOCARDIOGRAM (TEE) (N/A)  Total Length of Stay:  LOS: 2 days   SUBJECTIVE: Didn't sleep much from noise Feels otherwise ok ambulated  Vitals:   09/26/22 0600 09/26/22 0700  BP: 103/64 (!) 109/54  Pulse: 87 91  Resp: (!) 21 14  Temp:    SpO2: 94% 95%    Intake/Output      01/13 0701 01/14 0700 01/14 0701 01/15 0700   I.V. (mL/kg) 341.9 (5)    Blood     IV Piggyback 629.2    Total Intake(mL/kg) 971.1 (14.1)    Urine (mL/kg/hr) 2025 (1.2)    Blood     Chest Tube 130    Total Output 2155    Net -1184             sodium chloride Stopped (09/25/22 0852)   sodium chloride     sodium chloride Stopped (09/25/22 2307)   insulin Stopped (09/25/22 0939)   lactated ringers     lactated ringers     lactated ringers 10 mL/hr at 09/26/22 0700    CBC    Component Value Date/Time   WBC 14.1 (H) 09/26/2022 0354   RBC 3.49 (L) 09/26/2022 0354   HGB 10.9 (L) 09/26/2022 0354   HGB 13.1 08/27/2022 1518   HCT 32.3 (L) 09/26/2022 0354   HCT 38.6 08/27/2022 1518   PLT 175 09/26/2022 0354   PLT 252 08/27/2022 1518   MCV 92.6 09/26/2022 0354   MCV 94 08/27/2022 1518   MCH 31.2 09/26/2022 0354   MCHC 33.7 09/26/2022 0354   RDW 12.7 09/26/2022 0354   RDW 13.0 08/27/2022 1518   LYMPHSABS 1,170 07/21/2022 0733   MONOABS 704 01/28/2017 0900   EOSABS 170 07/21/2022 0733   BASOSABS 70 07/21/2022 0733   CMP     Component Value Date/Time   NA 134 (L) 09/26/2022 0354   NA 143 08/27/2022 1518   K 4.6 09/26/2022 0354   CL 100 09/26/2022 0354   CO2 27 09/26/2022 0354   GLUCOSE 116 (H) 09/26/2022 0354   BUN 7 (L) 09/26/2022 0354   BUN 9 08/27/2022 1518   CREATININE 0.59 09/26/2022 0354   CREATININE 0.71 07/21/2022 0733   CALCIUM 8.2 (L)  09/26/2022 0354   PROT 6.9 09/22/2022 1137   ALBUMIN 4.2 09/22/2022 1137   AST 26 09/22/2022 1137   ALT 19 09/22/2022 1137   ALKPHOS 56 09/22/2022 1137   BILITOT 0.7 09/22/2022 1137   GFRNONAA >60 09/26/2022 0354   GFRAA >60 08/26/2017 1024   ABG    Component Value Date/Time   PHART 7.292 (L) 09/24/2022 1722   PCO2ART 42.0 09/24/2022 1722   PO2ART 89 09/24/2022 1722   HCO3 20.4 09/24/2022 1722   TCO2 22 09/24/2022 1722   ACIDBASEDEF 6.0 (H) 09/24/2022 1722   O2SAT 96 09/24/2022 1722   CBG (last 3)  Recent Labs    09/25/22 1949 09/25/22 2340 09/26/22 0354  GLUCAP 116* 134* 108*  EXAM Lungs: decreased at R base Card: rr without murmur Dressing dry Ext: warm Neuro: alert and oriented   ASSESSMENT: POD#2 SP AVR Doing well DC pacing wires then chest tubes Remove sleeve Lasix Transfer to  floor   Coralie Common, MD 09/26/2022

## 2022-09-27 ENCOUNTER — Encounter (HOSPITAL_COMMUNITY): Payer: Self-pay | Admitting: Surgery

## 2022-09-27 LAB — SURGICAL PATHOLOGY

## 2022-09-27 LAB — BASIC METABOLIC PANEL
Anion gap: 8 (ref 5–15)
BUN: 7 mg/dL — ABNORMAL LOW (ref 8–23)
CO2: 28 mmol/L (ref 22–32)
Calcium: 7.9 mg/dL — ABNORMAL LOW (ref 8.9–10.3)
Chloride: 101 mmol/L (ref 98–111)
Creatinine, Ser: 0.7 mg/dL (ref 0.44–1.00)
GFR, Estimated: >60 mL/min (ref >60)
Glucose, Bld: 114 mg/dL — ABNORMAL HIGH (ref 70–99)
Potassium: 3.6 mmol/L (ref 3.5–5.1)
Sodium: 137 mmol/L (ref 135–145)

## 2022-09-27 LAB — GLUCOSE, CAPILLARY
Glucose-Capillary: 109 mg/dL — ABNORMAL HIGH (ref 70–99)
Glucose-Capillary: 112 mg/dL — ABNORMAL HIGH (ref 70–99)
Glucose-Capillary: 146 mg/dL — ABNORMAL HIGH (ref 70–99)

## 2022-09-27 LAB — TSH: TSH: 5.225 u[IU]/mL — ABNORMAL HIGH (ref 0.350–4.500)

## 2022-09-27 LAB — CBC
HCT: 27.5 % — ABNORMAL LOW (ref 36.0–46.0)
Hemoglobin: 9.5 g/dL — ABNORMAL LOW (ref 12.0–15.0)
MCH: 31.6 pg (ref 26.0–34.0)
MCHC: 34.5 g/dL (ref 30.0–36.0)
MCV: 91.4 fL (ref 80.0–100.0)
Platelets: 155 10*3/uL (ref 150–400)
RBC: 3.01 MIL/uL — ABNORMAL LOW (ref 3.87–5.11)
RDW: 12.7 % (ref 11.5–15.5)
WBC: 10.1 10*3/uL (ref 4.0–10.5)
nRBC: 0 % (ref 0.0–0.2)

## 2022-09-27 MED ORDER — FE FUM-VIT C-VIT B12-FA 460-60-0.01-1 MG PO CAPS
1.0000 | ORAL_CAPSULE | Freq: Every day | ORAL | Status: DC
  Administered 2022-09-28 – 2022-09-29 (×2): 1 via ORAL
  Filled 2022-09-27 (×2): qty 1

## 2022-09-27 MED ORDER — AMIODARONE HCL 200 MG PO TABS
400.0000 mg | ORAL_TABLET | Freq: Two times a day (BID) | ORAL | Status: DC
  Administered 2022-09-27 – 2022-09-29 (×5): 400 mg via ORAL
  Filled 2022-09-27 (×5): qty 2

## 2022-09-27 MED ORDER — AMIODARONE HCL 200 MG PO TABS: 400.0000 mg | ORAL_TABLET | Freq: Every day | ORAL | Status: DC

## 2022-09-27 NOTE — Progress Notes (Signed)
Amiodarone Drug - Drug Interaction Consult Note  Recommendations: - Amio + beta blocker (metoprolol) - monitor HR - Amio + Lasix - monitor K, continue daily K supplement - Amio + Zofran - can prolong Qtc (Qtc 466 on 1/13), monitor usage of Zofran (last 1/12)  Amiodarone is metabolized by the cytochrome P450 system and therefore has the potential to cause many drug interactions. Amiodarone has an average plasma half-life of 50 days (range 20 to 100 days).   There is potential for drug interactions to occur several weeks or months after stopping treatment and the onset of drug interactions may be slow after initiating amiodarone.   []  Statins: Increased risk of myopathy. Simvastatin- restrict dose to 20mg  daily. Other statins: counsel patients to report any muscle pain or weakness immediately.  []  Anticoagulants: Amiodarone can increase anticoagulant effect. Consider warfarin dose reduction. Patients should be monitored closely and the dose of anticoagulant altered accordingly, remembering that amiodarone levels take several weeks to stabilize.  []  Antiepileptics: Amiodarone can increase plasma concentration of phenytoin, the dose should be reduced. Note that small changes in phenytoin dose can result in large changes in levels. Monitor patient and counsel on signs of toxicity.  [x]  Beta blockers: increased risk of bradycardia, AV block and myocardial depression. Sotalol - avoid concomitant use.  []   Calcium channel blockers (diltiazem and verapamil): increased risk of bradycardia, AV block and myocardial depression.  []   Cyclosporine: Amiodarone increases levels of cyclosporine. Reduced dose of cyclosporine is recommended.  []  Digoxin dose should be halved when amiodarone is started.  [x]  Diuretics: increased risk of cardiotoxicity if hypokalemia occurs.  []  Oral hypoglycemic agents (glyburide, glipizide, glimepiride): increased risk of hypoglycemia. Patient's glucose levels should be  monitored closely when initiating amiodarone therapy.   []  Drugs that prolong the QT interval:  Torsades de pointes risk may be increased with concurrent use - avoid if possible.  Monitor QTc, also keep magnesium/potassium WNL if concurrent therapy can't be avoided.  Antibiotics: e.g. fluoroquinolones, erythromycin.  Antiarrhythmics: e.g. quinidine, procainamide, disopyramide, sotalol.  Antipsychotics: e.g. phenothiazines, haloperidol.   Lithium, tricyclic antidepressants, and methadone.  Thank You,   Dimple Nanas, PharmD, BCPS 09/27/2022 8:00 AM

## 2022-09-27 NOTE — Progress Notes (Signed)
CARDIAC REHAB PHASE I   Pt sitting in chair, feeling well this morning. Reports ambulating in hall once this morning independently. Tolerating ambulation well with no SOB and minimal incisional pain. Encouraged continued ambulation, IS use and po intake. Reviewed sternal precautions, pt verbalized understanding. Pt stood from chair to demonstrate technique, maintained proper sternal precautions. Will continue to follow.   0630-1601  Vanessa Barbara, RN BSN 09/27/2022 9:22 AM

## 2022-09-27 NOTE — Progress Notes (Signed)
Pt ambulated x 300 feet independently, pt tolerated well

## 2022-09-27 NOTE — Care Management Important Message (Signed)
Important Message  Patient Details  Name: Theresa [name] MRN: [id number] Date of Birth: [birth year]   Medicare Important Message Given:  Yes     Gerrica Cygan Montine Circle 09/27/2022, 3:27 PM

## 2022-09-27 NOTE — Progress Notes (Addendum)
[address]       [place] [address]             [phone]     3 Days Post-Op Procedure(s) (LRB): AORTIC VALVE REPLACEMENT (AVR) USING EDWARDS INSPIRIS RESILIA 23 MM AORTIC VALVE (N/A) TRANSESOPHAGEAL ECHOCARDIOGRAM (TEE) (N/A) Subjective: Having episodes of A-fib with RVR  Objective: Vital signs in last 24 hours: Temp:  [97.7 F (36.5 C)-99.9 F (37.7 C)] 98 F (36.7 C) (01/15 0500) Pulse Rate:  [79-101] 86 (01/15 0030) Cardiac Rhythm: Normal sinus rhythm;Atrial fibrillation;Other (Comment) (01/15 0425) Resp:  [14-32] 20 (01/15 0500) BP: (92-108)/(49-67) 106/67 (01/15 0500) SpO2:  [91 %-97 %] 94 % (01/15 0500) Weight:  [68.6 kg-69.3 kg] 68.6 kg (01/15 0500)  Hemodynamic parameters for last 24 hours:    Intake/Output from previous day: 01/14 0701 - 01/15 0700 In: 162.5 [P.O.:120; I.V.:42.5] Out: 1670 [Urine:1650; Chest Tube:20] Intake/Output this shift: No intake/output data recorded.  General appearance: alert, cooperative, and no distress Heart: regular rate and rhythm Lungs: clear to auscultation bilaterally Abdomen: Benign Extremities: No edema Wound: Dressing clean dry and intact  Lab Results: Recent Labs    09/26/22 0354 09/27/22 0105  WBC 14.1* 10.1  HGB 10.9* 9.5*  HCT 32.3* 27.5*  PLT 175 155   BMET:  Recent Labs    09/26/22 0354 09/27/22 0105  NA 134* 137  K 4.6 3.6  CL 100 101  CO2 27 28  GLUCOSE 116* 114*  BUN 7* 7*  CREATININE 0.59 0.70  CALCIUM 8.2* 7.9*    PT/INR:  Recent Labs    09/24/22 1210  LABPROT 16.7*  INR 1.4*   ABG    Component Value Date/Time   PHART 7.292 (L) 09/24/2022 1722   HCO3 20.4 09/24/2022 1722   TCO2 22 09/24/2022 1722   ACIDBASEDEF 6.0 (H) 09/24/2022 1722   O2SAT 96 09/24/2022 1722   CBG (last 3)  Recent Labs    09/26/22 1613 09/26/22 2120 09/27/22 0612  GLUCAP 123* 86 109*    Meds Scheduled Meds:  acetaminophen  1,000 mg Oral Q6H   Or   acetaminophen (TYLENOL)  oral liquid 160 mg/5 mL  1,000 mg Per Tube Q6H   aspirin EC  325 mg Oral Daily   Or   aspirin  324 mg Per Tube Daily   bisacodyl  10 mg Oral Daily   Or   bisacodyl  10 mg Rectal Daily   Chlorhexidine Gluconate Cloth  6 each Topical Daily   docusate sodium  200 mg Oral Daily   enoxaparin (LOVENOX) injection  40 mg Subcutaneous QHS   furosemide  40 mg Oral Daily   gabapentin  200 mg Oral TID   insulin aspart  0-24 Units Subcutaneous TID AC & HS   levothyroxine  88 mcg Oral Daily   metoprolol tartrate  12.5 mg Oral BID   Or   metoprolol tartrate  12.5 mg Per Tube BID   pantoprazole  40 mg Oral Daily   polyvinyl alcohol  1 drop Both Eyes QID   potassium chloride  20 mEq Oral Daily   sodium chloride flush  3 mL Intravenous Q12H   sodium chloride flush  3 mL Intravenous Q12H   Continuous Infusions:  sodium chloride Stopped (09/25/22 0852)   sodium chloride     sodium chloride Stopped (09/25/22 2307)   sodium chloride     lactated ringers     lactated ringers     lactated ringers  Stopped (09/26/22 1115)   PRN Meds:.sodium chloride, sodium chloride, dextrose, lactated ringers, metoprolol tartrate, midazolam, morphine injection, ondansetron (ZOFRAN) IV, mouth rinse, oxyCODONE, sodium chloride flush, sodium chloride flush  Xrays DG Chest Port 1 View  Result Date: 09/26/2022 CLINICAL DATA:  [id number] S/P AVR [id number] EXAM: PORTABLE CHEST 1 VIEW COMPARISON:  September 25, 2022 FINDINGS: The cardiomediastinal silhouette is unchanged in contour.Status post median sternotomy and aortic valve replacement. Removal of RIGHT neck PA catheter. Small bilateral pleural effusions. No pneumothorax. Bibasilar atelectasis. IMPRESSION: Small bilateral pleural effusions with bibasilar atelectasis. Electronically Signed   By: Valentino Saxon M.D.   On: 09/26/2022 13:58    Assessment/Plan: S/P Procedure(s) (LRB): AORTIC VALVE REPLACEMENT (AVR) USING EDWARDS INSPIRIS RESILIA 23 MM AORTIC VALVE  (N/A) TRANSESOPHAGEAL ECHOCARDIOGRAM (TEE) (N/A) POD #3 s/p AVR  1 afebrile, stable vital signs with SBP in the 90s to low 100s, atrial fibrillation, RVR at times will begin p.o. amiodarone Access peripheral, currently in sinus so will not give bolus for now 2 O2 sats are good on room air 3 weight trending down, now at preop level, good urine output 4 renal function except BUN slightly low at 7 5 expected acute blood loss anemia, lower today compared to most recent readings, most likely equilibrating, no leukocytosis or thrombocytopenia 6 blood sugars are well-controlled no history of diabetes 8 continue rehab and pulmonary hygiene     LOS: 3 days    John Giovanni PA-C Pager [phone] 09/27/2022   Chart reviewed, patient examined, agree with above. She has had some AF with RVR that was symptomatic. Converted on her own and started on po amio. If any further episodes would give her a bolus.

## 2022-09-28 ENCOUNTER — Other Ambulatory Visit: Payer: Self-pay | Admitting: *Deleted

## 2022-09-28 ENCOUNTER — Encounter (HOSPITAL_COMMUNITY): Payer: Self-pay | Admitting: Surgery

## 2022-09-28 ENCOUNTER — Other Ambulatory Visit: Payer: Self-pay

## 2022-09-28 LAB — MAGNESIUM: Magnesium: 1.7 mg/dL (ref 1.7–2.4)

## 2022-09-28 MED ORDER — LEVOTHYROXINE SODIUM 100 MCG PO TABS
100.0000 ug | ORAL_TABLET | Freq: Every day | ORAL | Status: DC
  Administered 2022-09-29: 100 ug via ORAL
  Filled 2022-09-28: qty 1

## 2022-09-28 NOTE — Progress Notes (Signed)
CARDIAC REHAB PHASE I   Pt sitting in chair, feeling well today. Ambulating several times per day in hall and room. Reports tolerating well, taking care to use proper sternal precautions. Post O[facility] education including site care, restrictions, risk factors, exercise guidelines, heart healthy diet, IS use at home, needs for home at discharge, sternal precautions and CRP2 reviewed with pt and wife. All questions and concerns addressed. Will refer to [facility] for CRP2. Will continue to follow.   [id number]  Vanessa Barbara, RN BSN 09/28/2022 2:41 PM

## 2022-09-28 NOTE — Progress Notes (Signed)
Mobility Specialist Progress Note:   09/28/22 1024  Mobility  Activity Ambulated with assistance in hallway  Level of Assistance Independent  Assistive Device None  Distance Ambulated (ft) 400 ft  Activity Response Tolerated well  $Mobility charge 1 Mobility   During Mobility: 92 HR Post Mobility:  81 HR  Pt in bed willing to participate in mobility. No complaints of pain. Left in bed with call bell in reach and all needs met.   Gareth Eagle Melquan Ernsberger Mobility Specialist Please contact via [employer] or  Rehab Office at [phone]

## 2022-09-28 NOTE — Progress Notes (Addendum)
[address]       ,[place] [address]             [phone]     4 Days Post-Op Procedure(s) (LRB): AORTIC VALVE REPLACEMENT (AVR) USING EDWARDS INSPIRIS RESILIA 23 MM AORTIC VALVE (N/A) TRANSESOPHAGEAL ECHOCARDIOGRAM (TEE) (N/A) Subjective: Some pain c/o  Objective: Vital signs in last 24 hours: Temp:  [97.9 F (36.6 C)-98.5 F (36.9 C)] 98 F (36.7 C) (01/16 0405) Pulse Rate:  [72-89] 86 (01/16 0405) Cardiac Rhythm: Normal sinus rhythm (01/15 2015) Resp:  [16-22] 20 (01/16 0405) BP: (96-116)/(41-65) 110/56 (01/16 0405) SpO2:  [90 %-98 %] 90 % (01/16 0405) Weight:  [66.2 kg] 66.2 kg (01/16 0405)  Hemodynamic parameters for last 24 hours:    Intake/Output from previous day: 01/15 0701 - 01/16 0700 In: 720 [P.O.:720] Out: -  Intake/Output this shift: No intake/output data recorded.  General appearance: alert, cooperative, and no distress Heart: regular rate and rhythm and soft systolic murmur Lungs: mildly dim in bases Abdomen: benign exam Extremities: no edema Wound: incis healing well  Lab Results: Recent Labs    09/26/22 0354 09/27/22 0105  WBC 14.1* 10.1  HGB 10.9* 9.5*  HCT 32.3* 27.5*  PLT 175 155   BMET:  Recent Labs    09/26/22 0354 09/27/22 0105  NA 134* 137  K 4.6 3.6  CL 100 101  CO2 27 28  GLUCOSE 116* 114*  BUN 7* 7*  CREATININE 0.59 0.70  CALCIUM 8.2* 7.9*    PT/INR: No results for input(s): "LABPROT", "INR" in the last 72 hours. ABG    Component Value Date/Time   PHART 7.292 (L) 09/24/2022 1722   HCO3 20.4 09/24/2022 1722   TCO2 22 09/24/2022 1722   ACIDBASEDEF 6.0 (H) 09/24/2022 1722   O2SAT 96 09/24/2022 1722   CBG (last 3)  Recent Labs    09/27/22 0612 09/27/22 1134 09/27/22 1628  GLUCAP 109* 112* 146*    Meds Scheduled Meds:  acetaminophen  1,000 mg Oral Q6H   Or   acetaminophen (TYLENOL) oral liquid 160 mg/5 mL  1,000 mg Per Tube Q6H   amiodarone  400 mg Oral Q12H   Followed by    Derrill Memo ON 10/04/2022] amiodarone  400 mg Oral Daily   aspirin EC  325 mg Oral Daily   Or   aspirin  324 mg Per Tube Daily   bisacodyl  10 mg Oral Daily   Or   bisacodyl  10 mg Rectal Daily   docusate sodium  200 mg Oral Daily   enoxaparin (LOVENOX) injection  40 mg Subcutaneous QHS   Fe Fum-Vit C-Vit B12-FA  1 capsule Oral QPC breakfast   furosemide  40 mg Oral Daily   gabapentin  200 mg Oral TID   levothyroxine  88 mcg Oral Daily   metoprolol tartrate  12.5 mg Oral BID   Or   metoprolol tartrate  12.5 mg Per Tube BID   pantoprazole  40 mg Oral Daily   polyvinyl alcohol  1 drop Both Eyes QID   potassium chloride  20 mEq Oral Daily   sodium chloride flush  3 mL Intravenous Q12H   Continuous Infusions:  sodium chloride Stopped (09/25/22 0852)   sodium chloride     sodium chloride Stopped (09/25/22 2307)   lactated ringers     PRN Meds:.sodium chloride, metoprolol tartrate, morphine injection, mouth rinse, oxyCODONE  Xrays DG Chest Port 1 View  Result Date: 09/26/2022 CLINICAL  DATA:  [id number] S/P AVR [id number] EXAM: PORTABLE CHEST 1 VIEW COMPARISON:  September 25, 2022 FINDINGS: The cardiomediastinal silhouette is unchanged in contour.Status post median sternotomy and aortic valve replacement. Removal of RIGHT neck PA catheter. Small bilateral pleural effusions. No pneumothorax. Bibasilar atelectasis. IMPRESSION: Small bilateral pleural effusions with bibasilar atelectasis. Electronically Signed   By: Valentino Saxon M.D.   On: 09/26/2022 13:58    Assessment/Plan: S/P Procedure(s) (LRB): AORTIC VALVE REPLACEMENT (AVR) USING EDWARDS INSPIRIS RESILIA 23 MM AORTIC VALVE (N/A) TRANSESOPHAGEAL ECHOCARDIOGRAM (TEE) (N/A) POD#4  1 afebrile, stable vital signs, SBP 96-116 NSR, some short episodes of afib w/RVR- cont po amio and metoprolol 2 oxygen saturations are good on room air 3 TSH is elevated at 5.22, she is on levothyroxine, will increase dose a little 4 conts rehab and pulm hygiene,  home 1-2 days if no new issues 5 will stop lasix/K+   LOS: 4 days    Theresa Giovanni PA-C Pager [phone]  09/28/2022   Chart reviewed, patient examined, agree with above. She feels well. Maintaining sinus rhythm for the most part on amio and Lopressor. If no significant arrhythmia overnight will plan to send home tomorrow.

## 2022-09-29 MED ORDER — FE FUM-VIT C-VIT B12-FA 460-60-0.01-1 MG PO CAPS: 1.0000 | ORAL_CAPSULE | Freq: Every day | ORAL | 1 refills | Status: DC

## 2022-09-29 MED ORDER — AMIODARONE HCL 400 MG PO TABS: 400.0000 mg | ORAL_TABLET | Freq: Every day | ORAL | 1 refills | Status: DC

## 2022-09-29 MED ORDER — ASPIRIN 325 MG PO TBEC: 325.0000 mg | DELAYED_RELEASE_TABLET | Freq: Every day | ORAL | Status: AC

## 2022-09-29 MED ORDER — OXYCODONE HCL 5 MG PO TABS: 5.0000 mg | ORAL_TABLET | Freq: Four times a day (QID) | ORAL | 0 refills | Status: AC | PRN

## 2022-09-29 MED ORDER — METOPROLOL TARTRATE 25 MG PO TABS: 12.5000 mg | ORAL_TABLET | Freq: Two times a day (BID) | ORAL | 1 refills | Status: DC

## 2022-09-29 MED ORDER — AMIODARONE HCL 400 MG PO TABS: 400.0000 mg | ORAL_TABLET | Freq: Two times a day (BID) | ORAL | 0 refills | Status: DC

## 2022-09-29 MED ORDER — LEVOTHYROXINE SODIUM 100 MCG PO TABS: 100.0000 ug | ORAL_TABLET | Freq: Every day | ORAL | 1 refills | Status: DC

## 2022-09-29 NOTE — Progress Notes (Signed)
CARDIAC REHAB PHASE I   Pt feeling well this morning and excited to go home today. Reviewed education for home provided yesterday. All questions and concerns addressed. Referral for CRP2 sent to [facility].   [id number]  Vanessa Barbara, RN BSN 09/29/2022 8:57 AM

## 2022-09-29 NOTE — Plan of Care (Signed)
  Problem: Education: Goal: Knowledge of General Education information will improve Description: Including pain rating scale, medication(s)/side effects and non-pharmacologic comfort measures Outcome: Completed/Met   Problem: Health Behavior/Discharge Planning: Goal: Ability to manage health-related needs will improve Outcome: Completed/Met   Problem: Clinical Measurements: Goal: Ability to maintain clinical measurements within normal limits will improve Outcome: Completed/Met Goal: Will remain free from infection Outcome: Completed/Met Goal: Diagnostic test results will improve Outcome: Completed/Met Goal: Cardiovascular complication will be avoided Outcome: Completed/Met   Problem: Activity: Goal: Risk for activity intolerance will decrease Outcome: Completed/Met   Problem: Nutrition: Goal: Adequate nutrition will be maintained Outcome: Completed/Met   Problem: Elimination: Goal: Will not experience complications related to bowel motility Outcome: Completed/Met   Problem: Pain Managment: Goal: General experience of comfort will improve Outcome: Completed/Met   Problem: Skin Integrity: Goal: Risk for impaired skin integrity will decrease Outcome: Completed/Met   Problem: Education: Goal: Will demonstrate proper wound care and an understanding of methods to prevent future damage Outcome: Completed/Met Goal: Knowledge of disease or condition will improve Outcome: Completed/Met Goal: Knowledge of the prescribed therapeutic regimen will improve Outcome: Completed/Met Goal: Individualized Educational Video(s) Outcome: Completed/Met   Problem: Activity: Goal: Risk for activity intolerance will decrease Outcome: Completed/Met   Problem: Cardiac: Goal: Will achieve and/or maintain hemodynamic stability Outcome: Completed/Met   Problem: Clinical Measurements: Goal: Postoperative complications will be avoided or minimized Outcome: Completed/Met   Problem:  Respiratory: Goal: Respiratory status will improve Outcome: Completed/Met   Problem: Skin Integrity: Goal: Wound healing without signs and symptoms of infection Outcome: Completed/Met Goal: Risk for impaired skin integrity will decrease Outcome: Completed/Met   Problem: Urinary Elimination: Goal: Ability to achieve and maintain adequate renal perfusion and functioning will improve Outcome: Completed/Met

## 2022-09-29 NOTE — TOC Transition Note (Signed)
Transition of Care [facility]) - CM/SW Discharge Note   Patient Details  Name: Theresa [name] MRN: [id number] Date of Birth: 12-26-1955  [facility]) CM/SW Contact:  Levonne Lapping, RN Phone Number: 09/29/2022, 10:05 AM   Clinical Narrative:     CM met with Patient to discuss dc plan to Home with Husband.  Patient confirmed her PCP and has no additional TOC needs. Patient will follow up as instructed          Patient Goals and CMS Choice      Discharge Placement                         Discharge Plan and Services Additional resources added to the After Visit Summary for                                       Social Determinants of Health (SDOH) Interventions SDOH Screenings   Food Insecurity: No Food Insecurity (09/28/2022)  Housing: Low Risk  (09/28/2022)  Transportation Needs: No Transportation Needs (09/28/2022)  Utilities: Not At Risk (09/28/2022)  Depression (PHQ2-9): Low Risk  (07/26/2022)  Financial Resource Strain: Low Risk  (06/21/2022)  Physical Activity: Sufficiently Active (06/21/2022)  Social Connections: Unknown (06/21/2022)  Stress: No Stress Concern Present (06/21/2022)  Tobacco Use: Low Risk  (09/28/2022)     Readmission Risk Interventions     No data to display

## 2022-09-29 NOTE — Progress Notes (Addendum)
[address]       [place] [address]             [phone]     5 Days Post-Op Procedure(s) (LRB): AORTIC VALVE REPLACEMENT (AVR) USING EDWARDS INSPIRIS RESILIA 23 MM AORTIC VALVE (N/A) TRANSESOPHAGEAL ECHOCARDIOGRAM (TEE) (N/A) Subjective: Feels ok, no new c/o  Objective: Vital signs in last 24 hours: Temp:  [97.5 F (36.4 C)-98.4 F (36.9 C)] 98.4 F (36.9 C) (01/17 0455) Pulse Rate:  [64-76] 64 (01/17 0455) Cardiac Rhythm: Normal sinus rhythm (01/16 2057) Resp:  [13-20] 13 (01/17 0455) BP: (99-130)/(55-81) 115/69 (01/17 0455) SpO2:  [90 %-98 %] 94 % (01/17 0455) Weight:  [66.3 kg] 66.3 kg (01/17 0455)  Hemodynamic parameters for last 24 hours:    Intake/Output from previous day: No intake/output data recorded. Intake/Output this shift: No intake/output data recorded.  General appearance: alert, cooperative, and no distress Heart: regular rate and rhythm Lungs: mildly dim right base Abdomen: benign Extremities: no edema Wound: incis healing well  Lab Results: Recent Labs    09/27/22 0105  WBC 10.1  HGB 9.5*  HCT 27.5*  PLT 155   BMET:  Recent Labs    09/27/22 0105  NA 137  K 3.6  CL 101  CO2 28  GLUCOSE 114*  BUN 7*  CREATININE 0.70  CALCIUM 7.9*    PT/INR: No results for input(s): "LABPROT", "INR" in the last 72 hours. ABG    Component Value Date/Time   PHART 7.292 (L) 09/24/2022 1722   HCO3 20.4 09/24/2022 1722   TCO2 22 09/24/2022 1722   ACIDBASEDEF 6.0 (H) 09/24/2022 1722   O2SAT 96 09/24/2022 1722   CBG (last 3)  Recent Labs    09/27/22 0612 09/27/22 1134 09/27/22 1628  GLUCAP 109* 112* 146*    Meds Scheduled Meds:  acetaminophen  1,000 mg Oral Q6H   Or   acetaminophen (TYLENOL) oral liquid 160 mg/5 mL  1,000 mg Per Tube Q6H   amiodarone  400 mg Oral Q12H   Followed by   Derrill Memo ON 10/04/2022] amiodarone  400 mg Oral Daily   aspirin EC  325 mg Oral Daily   Or   aspirin  324 mg Per Tube Daily    bisacodyl  10 mg Oral Daily   Or   bisacodyl  10 mg Rectal Daily   docusate sodium  200 mg Oral Daily   enoxaparin (LOVENOX) injection  40 mg Subcutaneous QHS   Fe Fum-Vit C-Vit B12-FA  1 capsule Oral QPC breakfast   gabapentin  200 mg Oral TID   levothyroxine  100 mcg Oral Daily   metoprolol tartrate  12.5 mg Oral BID   Or   metoprolol tartrate  12.5 mg Per Tube BID   pantoprazole  40 mg Oral Daily   polyvinyl alcohol  1 drop Both Eyes QID   sodium chloride flush  3 mL Intravenous Q12H   Continuous Infusions:  sodium chloride Stopped (09/25/22 0852)   sodium chloride     sodium chloride Stopped (09/25/22 2307)   lactated ringers     PRN Meds:.sodium chloride, metoprolol tartrate, morphine injection, mouth rinse, oxyCODONE  Xrays No results found.  Assessment/Plan: S/P Procedure(s) (LRB): AORTIC VALVE REPLACEMENT (AVR) USING EDWARDS INSPIRIS RESILIA 23 MM AORTIC VALVE (N/A) TRANSESOPHAGEAL ECHOCARDIOGRAM (TEE) (N/A) POD#5  1 afeb, VSS sinus rhythm on amio and lopressor 2 O2 sats good on RA 3 MG 1.7, no other new labs 4 she is stable  for d/c   LOS: 5 days    John Giovanni PA-C Pager [phone] 09/29/2022   Chart reviewed, patient examined, agree with above. She looks great and maintaining sinus rhythm. Plan home today on amio and lopressor.

## 2022-09-29 NOTE — Progress Notes (Signed)
Discharge instructions reviewed with pt.  Copy of instructions given to pt. Pt informed scripts sent to her pharmacy for pick up, and needs to get aspirin EC 325mg  OTC.  Pt waiting for husband to arrive, he is in route and should be here shortly, husband has her clothes to wear home. Pt instructed to call unit desk when husband arrives to her room and she is dressed to take out.

## 2022-10-01 DIAGNOSIS — Z48812 Encounter for surgical aftercare following surgery on the circulatory system: Secondary | ICD-10-CM | POA: Diagnosis not present

## 2022-10-04 ENCOUNTER — Ambulatory Visit: Payer: Self-pay | Admitting: *Deleted

## 2022-10-04 DIAGNOSIS — I4891 Unspecified atrial fibrillation: Secondary | ICD-10-CM | POA: Insufficient documentation

## 2022-10-04 DIAGNOSIS — Z4802 Encounter for removal of sutures: Secondary | ICD-10-CM

## 2022-10-04 HISTORY — DX: Unspecified atrial fibrillation: I48.91

## 2022-10-04 NOTE — Discharge Summary (Signed)
Physician Discharge Summary       [address]       [place], [address]             [phone]    Patient ID: Theresa [name] MRN: [id number] DOB/AGE: 1956-01-11 67 y.o.  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[place]) 06/20/2021   Pseudoarthrosis of cervical spine ([place]) 02/28/2020   Bilateral arm pain 02/08/2020   Elevated blood-pressure reading, without diagnosis of hypertension 01/22/2020   Bilateral hand pain 06/24/2018   Paresthesia of upper limb 04/28/2018   Stenosis of intervertebral foramina 04/21/2018   Carpal tunnel syndrome 03/24/2018   Aortic regurgitation 02/17/2018   Cervicogenic headache 10/26/2017   Pseudoarthrosis of lumbar spine 10/26/2017   Tendinitis of left rotator cuff 10/26/2017   Status post cervical spinal fusion 10/14/2017   History of lumbar fusion 09/20/2017   Lumbar stenosis with neurogenic claudication 08/31/2017   Degeneration of internal semilunar cartilage of right knee 04/22/2017   Herniated nucleus pulposus, lumbar 10/28/2016   Degenerative disc disease, lumbar 10/22/2016   Lumbar radiculopathy 10/22/2016   Backache 10/05/2016   Insomnia secondary to anxiety 01/31/2016   S/P TAH (total abdominal hysterectomy) 01/30/2016   Neck pain 03/04/2014   Brachial radiculitis 09/03/2013   Osteopenia 08/08/2013   Cervico-occipital neuralgia 03/08/2013   Spinal stenosis in cervical region 03/08/2013   Glaucoma  (increased eye pressure) 01/07/2013   Thyromegaly 12/16/2012   Cervical spine degeneration 10/23/2012   Special screening for malignant neoplasms, colon 09/18/2011   Breast cancer screening by mammogram 09/18/2011   Hypothyroidism 09/18/2011   Congenital heart defect 11/17/1957    Consults: None  Procedure (s): surgery   09/24/2022 Theresa [name] [id number]   Surgeon:  Theresa Pollack, MD   First Assistant: Theresa Pierini, PA-C:     Preoperative Diagnosis:  Quadracuspid aortic valve with severe AI     Postoperative Diagnosis:  Same     Procedure:   Median Sternotomy Extracorporeal circulation 3.   Aortic valve replacement using a 23 mm Edwards INSPIRIS RESILIA pericardial valve.   Anesthesia:  General Endotracheal  PCP is Theresa Mc, MD Referring Provider is Theresa Records, MD   HPI:   The patient is a 67 year old woman with a long history of a heart murmur and a quadricuspid aortic valve diagnosed in 2018.  2D echocardiogram at that time showed moderate aortic insufficiency with a pressure half-time of 513 ms.  Left ventricular ejection fraction was 55 to 60% with an LV diastolic dimension of 5.1 cm.  Her aortic insufficiency was unchanged on echocardiogram in January 2019 and August 7371 but the diastolic dimension was decreased at 4.6 cm.  Her most recent echo on 06/28/2022 was felt to show moderate to severe aortic insufficiency with near holosystolic reversal in arch color flow although the AI pressure half-time was still 551 ms.  Left ventricular ejection fraction is unchanged at 55 to 60% and the LV diastolic diameter was 4.9 cm.  She lives with her husband.  They are very active walking daily and she has not noticed any shortness of breath or fatigue.  She denies any peripheral edema.  She has had no orthopnea or PND.  She denies any chest pain or pressure.  Her main problems have been orthopedic with multiple spine surgeries, left total hip replacement, and a  right knee meniscus repair.  The patient and all relevant studies were reviewed by Theresa [name] who recommended proceeding with elective aortic valve replacement.  Hospital course:  The patient was admitted and on 09/24/2022 taken to the operating room at which time she underwent aortic valve replacement with #23 Inspiris bioprosthetic.  She tolerated the procedure well and was taken to the surgical intensive care unit in stable condition.  Postoperative hospital course:  Patient has done well.  She was extubated using standard post cardiac surgical protocols without difficulty.  She has maintained stable hemodynamics and normal sinus rhythm.  All routine lines, monitors and drainage devices have been discontinued in the standard fashion.  she was  started on a course of diuretics for expected postoperative volume overload.  She has maintained normal renal function.  She does have a mild expected acute blood loss anemia.  She did develop postoperative atrial fibrillation.  She was treated with amiodarone and beta-blocker.  She chemically converted to sinus rhythm.  Her TSH was noted to be a little bit high.  We adjusted her levothyroxine dose.  Incisions noted to be healing well without evidence of infection.  She was tolerating diet and routine cardiac rehab modalities.  Oxygen was weaned and maintaining good saturations on room air.  Overall,  at the time of discharge the patient was felt to be quite stable.      Latest Vital Signs: Blood pressure 106/80, pulse 78, temperature 98.1 F (36.7 C), temperature source Oral, resp. rate 17, height 5\' 7"  (1.702 m), weight 66.3 kg, SpO2 94 %.  Physical Exam:   General appearance: alert, cooperative, and no distress Heart: regular rate and rhythm Lungs: mildly dim right base Abdomen: benign Extremities: no edema Wound: incis healing well Discharge Condition:good  Recent laboratory studies:  Lab Results  Component Value Date   WBC 10.1 09/27/2022    HGB 9.5 (L) 09/27/2022   HCT 27.5 (L) 09/27/2022   MCV 91.4 09/27/2022   PLT 155 09/27/2022   Lab Results  Component Value Date   NA 137 09/27/2022   K 3.6 09/27/2022   CL 101 09/27/2022   CO2 28 09/27/2022   CREATININE 0.70 09/27/2022   GLUCOSE 114 (H) 09/27/2022      Diagnostic Studies: DG Chest Port 1 View  Result Date: 09/26/2022 CLINICAL DATA:  09/28/2022 S/P AVR [id number] EXAM: PORTABLE CHEST 1 VIEW COMPARISON:  September 25, 2022 FINDINGS: The cardiomediastinal silhouette is unchanged in contour.Status post median sternotomy and aortic valve replacement. Removal of RIGHT neck PA catheter. Small bilateral pleural effusions. No pneumothorax. Bibasilar atelectasis. IMPRESSION: Small bilateral pleural effusions with bibasilar atelectasis. Electronically Signed   By: September 27, 2022 M.D.   On: 09/26/2022 13:58   DG Chest Port 1 View  Result Date: 09/25/2022 CLINICAL DATA:  Status post aortic valve replacement EXAM: PORTABLE CHEST 1 VIEW COMPARISON:  September 24, 2022 FINDINGS: The ET tube is been removed. Mediastinal drain, left chest tube, and PA catheter stable. No pneumothorax. Mild subsegmental atelectasis and probable tiny effusions are stable. No other interval changes. IMPRESSION: 1. Support apparatus as above. 2. No other changes. Electronically  Signed   By: Gerome Sam III M.D.   On: 09/25/2022 09:33   ECHO INTRAOPERATIVE TEE  Result Date: 09/24/2022  *INTRAOPERATIVE TRANSESOPHAGEAL REPORT *  Patient Name:   [facility] Date of Exam: 09/24/2022 Medical Rec #:  [id number]           Height:       67.0 in Accession #:    [id number]          Weight:       148.0 lb Date of Birth:  Mar 25, 1956           BSA:          1.78 m Patient Age:    66 years            BP:           131/52 mmHg Patient Gender: F                   HR:           60 bpm. Exam Location:  Inpatient Transesophogeal exam was perform intraoperatively during surgical procedure. Patient was closely monitored under  general anesthesia during the entirety of examination. Indications:     Aortic valve replacement Performing Phys: [id number] BARTLE Diagnosing Phys: Eilene Ghazi MD Complications: No known complications during this procedure. POST-OP IMPRESSIONS _ Left Ventricle: The left ventricle is unchanged from pre-bypass. _ Right Ventricle: The right ventricle appears unchanged from pre-bypass. _ Aorta: The aorta appears unchanged from pre-bypass. _ Left Atrial Appendage: The left atrial appendage appears unchanged from pre-bypass. _ Aortic Valve: No stenosis present. A bovine bioprosthetic valve was placed, leaflets are freely mobile Manufactured by; inspiris Size; 72mm. No regurgitation post repair. No perivalvular leak noted. _ Mitral Valve: The mitral valve appears unchanged from pre-bypass. _ Tricuspid Valve: The tricuspid valve appears unchanged from pre-bypass. _ Pulmonic Valve: The pulmonic valve appears unchanged from pre-bypass. _ Interatrial Septum: The interatrial septum appears unchanged from pre-bypass. _ Comments: Good LV funtion post bypass AV replacement functioning properly with good leaflet movement and no perivalvular leak. PRE-OP FINDINGS  Left Ventricle: The left ventricle has normal systolic function, with an ejection fraction of 60-65%. The cavity size was mildly dilated. There is no increase in left ventricular wall thickness. There is no left ventricular hypertrophy. Right Ventricle: The right ventricle has normal systolic function. The cavity was normal. There is no increase in right ventricular wall thickness. Left Atrium: Left atrial size was normal in size. No left atrial/left atrial appendage thrombus was detected. The left atrial appendage is well visualized and there is no evidence of thrombus present. Right Atrium: Right atrial size was normal in size. Interatrial Septum: No atrial level shunt detected by color flow Doppler. Agitated saline contrast was given intravenously to evaluate for  intracardiac shunting. There is no evidence of a patent foramen ovale. Pericardium: There is no evidence of pericardial effusion. Mitral Valve: The mitral valve is normal in structure. Mitral valve regurgitation is not visualized by color flow Doppler. There is No evidence of mitral stenosis. Tricuspid Valve: The tricuspid valve was normal in structure. Tricuspid valve regurgitation is trivial by color flow Doppler. No evidence of tricuspid stenosis is present. Aortic Valve: Quadrivalent/4 leaflets Aortic valve regurgitation is moderate by color flow Doppler. The jet is anteriorly-directed. There is no stenosis of the aortic valve. There is no evidence of aortic valve vegetation. Pulmonic Valve: The pulmonic valve was normal in structure, with normal. No evidence  of pumonic stenosis. Pulmonic valve regurgitation is not visualized by color flow Doppler. Aorta: The aortic root and ascending aorta are normal in size and structure. Shunts: There is no evidence of an atrial septal defect.  +------------------+---------++ LV Volumes (MOD)            +------------------+---------++ LV area d, A4C:   29.60 cm +------------------+---------++ LV area s, A4C:   16.00 cm +------------------+---------++ LV major d, A4C:  7.29 cm   +------------------+---------++ LV major s, A4C:  5.83 cm   +------------------+---------++ LV vol d, MOD A4C:97.4 ml   +------------------+---------++ LV vol s, MOD A4C:36.1 ml   +------------------+---------++ LV SV MOD A4C:    97.4 ml   +------------------+---------++ +------------+--------++ AORTIC VALVE         +------------+--------++ AR PHT:     600 msec +------------+--------++  Eilene Ghazi MD Electronically signed by Eilene Ghazi MD Signature Date/Time: 09/24/2022/1:59:25 PM    Final    DG Chest Port 1 View  Result Date: 09/24/2022 CLINICAL DATA:  Status post aortic valve replacement EXAM: PORTABLE CHEST 1 VIEW COMPARISON:  09/22/2022 FINDINGS: There  is interval placement of prosthetic aortic valve. There are linear densities in the lower lung fields. There are no signs of pulmonary edema. There is no pleural effusion or pneumothorax. Tip of endotracheal tube is 3.4 cm above the carina. Tip of right IJ Swan-Ganz catheter is seen in the course of main pulmonary artery. There is mediastinal drain. Left chest tube is seen. IMPRESSION: Status post aortic valve replacement. There are linear densities in both lower lung fields suggesting subsegmental atelectasis. Electronically Signed   By: Ernie Avena M.D.   On: 09/24/2022 12:55   DG Chest 2 View  Result Date: 09/22/2022 CLINICAL DATA:  Provided history: Preoperative chest exam. Preoperative for aortic valve replacement. EXAM: CHEST - 2 VIEW COMPARISON:  None. FINDINGS: Heart size within normal limits. No appreciable airspace consolidation. No evidence of pleural effusion or pneumothorax. No acute bony abnormality identified. ACDF hardware. IMPRESSION: No evidence of active cardiopulmonary disease. Electronically Signed   By: Jackey Loge D.O.   On: 09/22/2022 19:17   VAS US DOPPLER PRE CABG  Result Date: 09/22/2022 PREOPERATIVE VASCULAR EVALUATION Patient Name:  DEDRA MATSUO  Date of Exam:   09/22/2022 Medical Rec #: [id number]            Accession #:    [id number] Date of Birth: [birth year]            Patient Gender: F Patient Age:   79 years Exam Location:  [facility] Procedure:      VAS US DOPPLER PRE CABG Referring Phys: Evelene Croon --------------------------------------------------------------------------------  Indications:      Pre-CABG. Comparison Study: no prior Performing Technologist: [place] Ponder RVS  Examination Guidelines: A complete evaluation includes B-mode imaging, spectral Doppler, color Doppler, and power Doppler as needed of all accessible portions of each vessel. Bilateral testing is considered an integral part of a complete examination. Limited examinations for  reoccurring indications may be performed as noted.  Right Carotid Findings: +----------+--------+--------+--------+------------+--------+           PSV cm/sEDV cm/sStenosisDescribe    Comments +----------+--------+--------+--------+------------+--------+ CCA Prox  99      13              heterogenous         +----------+--------+--------+--------+------------+--------+ CCA Distal60      15              heterogenous         +----------+--------+--------+--------+------------+--------+  ICA Prox  75      25              heterogenous         +----------+--------+--------+--------+------------+--------+ ICA Distal93      36                                   +----------+--------+--------+--------+------------+--------+ ECA       98      8                                    +----------+--------+--------+--------+------------+--------+ +----------+--------+-------+--------+------------+           PSV cm/sEDV cmsDescribeArm Pressure +----------+--------+-------+--------+------------+ Subclavian99                                  +----------+--------+-------+--------+------------+ +---------+--------+--+--------+--+---------+ VertebralPSV cm/s40EDV cm/s10Antegrade +---------+--------+--+--------+--+---------+ Left Carotid Findings: +----------+--------+--------+--------+------------+--------+           PSV cm/sEDV cm/sStenosisDescribe    Comments +----------+--------+--------+--------+------------+--------+ CCA Prox  111     13              heterogenous         +----------+--------+--------+--------+------------+--------+ CCA Distal85      14              heterogenous         +----------+--------+--------+--------+------------+--------+ ICA Prox  77      26              heterogenous         +----------+--------+--------+--------+------------+--------+ ICA Distal107     34                                    +----------+--------+--------+--------+------------+--------+ ECA       51      7                                    +----------+--------+--------+--------+------------+--------+ +----------+--------+--------+--------+------------+ SubclavianPSV cm/sEDV cm/sDescribeArm Pressure +----------+--------+--------+--------+------------+           87                                   +----------+--------+--------+--------+------------+ +---------+--------+--+--------+-+---------+ VertebralPSV cm/s26EDV cm/s5Antegrade +---------+--------+--+--------+-+---------+  ABI Findings: +--------+------------------+-----+---------+--------+ Right   Rt Pressure (mmHg)IndexWaveform Comment  +--------+------------------+-----+---------+--------+ Brachial                       triphasic         +--------+------------------+-----+---------+--------+ +--------+------------------+-----+---------+-------+ Left    Lt Pressure (mmHg)IndexWaveform Comment +--------+------------------+-----+---------+-------+ Brachial                       triphasic        +--------+------------------+-----+---------+-------+  Right Doppler Findings: +--------+--------+-----+---------+--------+ Site    PressureIndexDoppler  Comments +--------+--------+-----+---------+--------+ Brachial             triphasic         +--------+--------+-----+---------+--------+ Radial               triphasic         +--------+--------+-----+---------+--------+ Ulnar  triphasic         +--------+--------+-----+---------+--------+  Left Doppler Findings: +--------+--------+-----+---------+--------+ Site    PressureIndexDoppler  Comments +--------+--------+-----+---------+--------+ Brachial             triphasic         +--------+--------+-----+---------+--------+ Radial               triphasic         +--------+--------+-----+---------+--------+ Ulnar                 triphasic         +--------+--------+-----+---------+--------+   Summary: Right Carotid: The extracranial vessels were near-normal with only minimal wall                thickening or plaque. Left Carotid: The extracranial vessels were near-normal with only minimal wall               thickening or plaque. Vertebrals: Bilateral vertebral arteries demonstrate antegrade flow. Right Upper Extremity: Doppler waveforms remain within normal limits with right radial compression. Doppler waveforms remain within normal limits with right ulnar compression. Left Upper Extremity: Doppler waveforms remain within normal limits with left radial compression. Doppler waveforms remain within normal limits with left ulnar compression.  Electronically signed by Lemar Livings MD on 09/22/2022 at 5:04:54 PM.    Final        Discharge Instructions     Amb Referral to Cardiac Rehabilitation   Complete by: As directed    Diagnosis: Valve Replacement   Valve: Aortic   After initial evaluation and assessments completed: Virtual Based Care may be provided alone or in conjunction with Phase 2 Cardiac Rehab based on patient barriers.: Yes   Intensive Cardiac Rehabilitation (ICR) [name] location only OR Traditional Cardiac Rehabilitation (TCR) *If criteria for ICR are not met will enroll in TCR [facility] only): Yes   Discharge patient   Complete by: As directed    Discharge disposition: 01-Home or Self Care   Discharge patient date: 09/29/2022       Discharge Medications: Allergies as of 09/29/2022       Reactions   Mobic [meloxicam] Other (See Comments)   Dizziness   Topamax [topiramate] Other (See Comments)   Caused hair loss   Tramadol Itching, Other (See Comments)   Recently developed        Medication List     STOP taking these medications    ALPRAZolam 0.5 MG 24 hr tablet Commonly known as: XANAX XR   amLODipine 2.5 MG tablet Commonly known as: NORVASC       TAKE these medications    amiodarone 400 MG  tablet Commonly known as: PACERONE Take 1 tablet (400 mg total) by mouth every 12 (twelve) hours.   amiodarone 400 MG tablet Commonly known as: PACERONE Take 1 tablet (400 mg total) by mouth daily.   aspirin EC 325 MG tablet Take 1 tablet (325 mg total) by mouth daily.   cetirizine 10 MG tablet Commonly known as: ZYRTEC Take 10 mg by mouth in the morning.   Fe Fum-Vit C-Vit B12-FA Caps capsule Commonly known as: TRIGELS-F FORTE Take 1 capsule by mouth daily after breakfast.   gabapentin 100 MG capsule Commonly known as: NEURONTIN Take 2 capsules (200 mg total) by mouth 3 (three) times daily.   levothyroxine 100 MCG tablet Commonly known as: SYNTHROID Take 1 tablet (100 mcg total) by mouth daily. What changed:  medication strength how much to take   Magnesium 250 MG Tabs Take 250  mg by mouth at bedtime.   metoprolol tartrate 25 MG tablet Commonly known as: LOPRESSOR Take 0.5 tablets (12.5 mg total) by mouth 2 (two) times daily.   multivitamin with minerals Tabs tablet Take 1 tablet by mouth every evening.   oxyCODONE 5 MG immediate release tablet Commonly known as: Oxy IR/ROXICODONE Take 1 tablet (5 mg total) by mouth every 6 (six) hours as needed for up to 7 days for severe pain.   PROBIOTIC PEARLS WOMENS PO Take 1 capsule by mouth daily after breakfast.   Refresh Tears 0.5 % Soln Generic drug: carboxymethylcellulose Place 1 drop into both eyes 4 (four) times daily.   Vitamin D3 25 MCG (1000 UT) Caps Take 1,000 Units by mouth at bedtime.        Follow Up Appointments:  Follow-up Information     [facility] (Formerly Follow up.   Why: Iantha Fallen will contact you to arrange a home visit Contact information: [address] [place] [address] [phone]                 Signed: Noel Christmas 10/04/2022, 2:52 PM

## 2022-10-04 NOTE — Progress Notes (Signed)
Patient arrived for nurse visit to remove sutures post-AVR 1/12 by Dr. Cyndia Bent.  Two sutures removed with no signs or symptoms of infection noted.  Incisions well approximated.  Patient tolerated suture removal well.  Patient called the answering service over the weekend c/o low BP and dizziness.  Per patient, she spoke with Dr. Cyndia Bent who instructed patient to discontinue taking metoprolol. Patient states she is still experiencing slight dizziness but is feeling better today. Patient and family instructed to keep the incision site clean and dry. Patient and family acknowledged instructions given.  All questions answered.

## 2022-10-06 MED FILL — Sodium Chloride IV Soln 0.9%: INTRAVENOUS | Qty: 2000 | Status: AC

## 2022-10-06 MED FILL — Heparin Sodium (Porcine) Inj 1000 Unit/ML: Qty: 1000 | Status: AC

## 2022-10-06 MED FILL — Electrolyte-R (PH 7.4) Solution: INTRAVENOUS | Qty: 3000 | Status: AC

## 2022-10-06 MED FILL — Potassium Chloride Inj 2 mEq/ML: INTRAVENOUS | Qty: 40 | Status: AC

## 2022-10-06 MED FILL — Lidocaine HCl Local Preservative Free (PF) Inj 2%: INTRAMUSCULAR | Qty: 14 | Status: AC

## 2022-10-06 MED FILL — Sodium Bicarbonate IV Soln 8.4%: INTRAVENOUS | Qty: 50 | Status: AC

## 2022-10-06 MED FILL — Heparin Sodium (Porcine) Inj 1000 Unit/ML: INTRAMUSCULAR | Qty: 20 | Status: AC

## 2022-10-14 NOTE — Progress Notes (Signed)
Office Visit    Patient Name: Theresa [name] Date of Encounter: 10/15/2022  PCP:  Crecencio Mc, MD   [facility]  Cardiologist:  Dorris Carnes, MD  Advanced Practice Provider:  No care team member to display Electrophysiologist:  None   HPI    Theresa [name] is a 67 y.o. female with past medical history significant for quadricuspid aortic valve disease, heart murmur, osteoporosis, kidney stones presents today for follow-up appointment.  She was seen 08/27/2022 for a precath workup.  She been followed by Dr. Harrington Challenger since 2018 when she was seen for management of aortic insufficiency and shortness of breath and fatigue.  2D echocardiogram was completed with LVEF 55 to 60%, left ventricular end-diastolic pressure was 51 mm with moderate aortic insufficiency.  Continued to do well with no palpitations or chest pain with 2D echocardiogram revealing moderate AI.  Recent 2D echo was completed 10/23 with EF 55 to 60% and moderate to severe AR.  Sent for surgical consultation and was seen by Dr. Cyndia Bent and 07/29/2022.  The determination was made following review of her most recent echo to proceed with surgical intervention and aortic valve replacement.  Scheduled to undergo AVR replacement in 09/24/2022.  Was being worked up for a right and left heart catheterization at that time.The heart catheterization showed minimal irregularity in the RCA  but otherwise normal coronary arteries.   Today, she states that she has been doing well since her AVR surgery.  She has been walking almost a mile without any issues.  She has a follow-up echocardiogram scheduled for the end of the month.  Her follow-up with Dr. Cyndia Bent is in about 2 weeks.  She states that her aortic valve went from mild to severe in about 2 years time.  Overall, she is recovering really well.  She wishes to do rehab at [facility] which is closer to her home.  Reports no shortness of breath nor dyspnea on exertion.  Reports no chest pain, pressure, or tightness. No edema, orthopnea, PND. Reports no palpitations.   Past Medical History    Past Medical History:  Diagnosis Date   Breast screening, unspecified    H/O: Bell's palsy    recurrent   Headache    Heart murmur    History of kidney stones    Hypercalcemia    Osteoporosis    Screening for malignant neoplasm of the cervix    Special screening for malignant neoplasms, colon    Unspecified hypothyroidism    thyroid nodule   Unspecified otitis media    Past Surgical History:  Procedure Laterality Date   ANTERIOR CERVICAL DECOMP/DISCECTOMY FUSION  13,15   C3 ,4 ,5 [facility]   ANTERIOR FUSION CERVICAL SPINE  2013   [facility]  c5-c6   AORTIC VALVE REPLACEMENT N/A 09/24/2022   Procedure: AORTIC VALVE REPLACEMENT (AVR) USING EDWARDS INSPIRIS RESILIA 23 MM AORTIC VALVE;  Surgeon: Gaye Pollack, MD;  Location: [place] OR;  Service: Open Heart Surgery;  Laterality: N/A;  Median sternotomy   CHOLECYSTECTOMY  1989   dental implant     MENISCUS REPAIR Right 01/2010   [facility]   RIGHT/LEFT HEART CATH AND CORONARY ANGIOGRAPHY N/A 09/01/2022   Procedure: RIGHT/LEFT HEART CATH AND CORONARY ANGIOGRAPHY;  Surgeon: Sherren Mocha, MD;  Location: [place] [facility];  Service: Cardiovascular;  Laterality: N/A;   SPINE SURGERY     [facility], C3, 4 5   TEE WITHOUT CARDIOVERSION N/A 09/24/2022   Procedure: TRANSESOPHAGEAL  ECHOCARDIOGRAM (TEE);  Surgeon: Gaye Pollack, MD;  Location: [place];  Service: Open Heart Surgery;  Laterality: N/A;   TOTAL ABDOMINAL HYSTERECTOMY     secondary to fibroids still has ovaries   TOTAL HIP ARTHROPLASTY Left 08/31/2021   Procedure: TOTAL HIP ARTHROPLASTY;  Surgeon: Dereck Leep, MD;  Location: [facility] ORS;  Service: Orthopedics;  Laterality: Left;    Allergies  Allergies  Allergen Reactions   Mobic [Meloxicam] Other (See Comments)    Dizziness   Topamax [Topiramate] Other (See Comments)    Caused hair loss   Tramadol Itching and  Other (See Comments)    Recently developed     EKGs/Labs/Other Studies Reviewed:   The following studies were reviewed today:  09/24/22 Echo   PRE-OP FINDINGS   Left Ventricle: The left ventricle has normal systolic function, with an  ejection fraction of 60-65%. The cavity size was mildly dilated. There is  no increase in left ventricular wall thickness. There is no left  ventricular hypertrophy.    Right Ventricle: The right ventricle has normal systolic function. The  cavity was normal. There is no increase in right ventricular wall  thickness.   Left Atrium: Left atrial size was normal in size. No left atrial/left  atrial appendage thrombus was detected. The left atrial appendage is well  visualized and there is no evidence of thrombus present.   Right Atrium: Right atrial size was normal in size.   Interatrial Septum: No atrial level shunt detected by color flow Doppler.  Agitated saline contrast was given intravenously to evaluate for  intracardiac shunting. There is no evidence of a patent foramen ovale.   Pericardium: There is no evidence of pericardial effusion.   Mitral Valve: The mitral valve is normal in structure. Mitral valve  regurgitation is not visualized by color flow Doppler. There is No  evidence of mitral stenosis.   Tricuspid Valve: The tricuspid valve was normal in structure. Tricuspid  valve regurgitation is trivial by color flow Doppler. No evidence of  tricuspid stenosis is present.   Aortic Valve: Quadrivalent/4 leaflets Aortic valve regurgitation is  moderate by color flow Doppler. The jet is anteriorly-directed. There is  no stenosis of the aortic valve. There is no evidence of aortic valve  vegetation.    Pulmonic Valve: The pulmonic valve was normal in structure, with normal.  No evidence of pumonic stenosis.  Pulmonic valve regurgitation is not visualized by color flow Doppler.    Aorta: The aortic root and ascending aorta are normal  in size and  structure.   Shunts: There is no evidence of an atrial septal defect.    Cardiac cath 09/01/22  Left Main  Vessel is angiographically normal.    Left Anterior Descending  Vessel is angiographically normal. The LAD is a large vessel. The vessel reaches and wraps around the apex of the heart. The first diagonal is small and the second diagonal covers a large territory. There is no significant stenosis throughout the LAD distribution.    Left Circumflex  Vessel is angiographically normal. The circumflex is patent throughout with no stenosis. The first OM is large without any significant stenoses. This is the only significant branch of the circumflex.    Right Coronary Artery  The vessel exhibits minimal luminal irregularities. The RCA is dominant. The mid vessel has some minor irregularities but there were no significant stenoses throughout the course of the RCA. The PDA and PLA branches are patent with no stenoses.  Intervention   No interventions have been documented.   Coronary Diagrams  Diagnostic Dominance: Right  Intervention   Echo 09/24/22  IAS/Shunts: No atrial level shunt detected by color flow Doppler  EKG:  EKG is not ordered today.    Recent Labs: 09/22/2022: ALT 19 09/27/2022: BUN 7; Creatinine, Ser 0.70; Hemoglobin 9.5; Platelets 155; Potassium 3.6; Sodium 137; TSH 5.225 09/28/2022: Magnesium 1.7  Recent Lipid Panel    Component Value Date/Time   CHOL 212 (H) 07/21/2022 0733   TRIG 112 07/21/2022 0733   HDL 77 07/21/2022 0733   CHOLHDL 2.8 07/21/2022 0733   VLDL 12 01/28/2017 0900   LDLCALC 113 (H) 07/21/2022 0733   LDLDIRECT 133 (H) 07/21/2022 0733    Home Medications   Current Meds  Medication Sig   amiodarone (PACERONE) 400 MG tablet Take 1 tablet (400 mg total) by mouth daily.   amoxicillin (AMOXIL) 500 MG capsule Take 500 mg by mouth once. Pt takes for dental appointment.   aspirin EC 325 MG tablet Take 1 tablet (325 mg total) by  mouth daily.   carboxymethylcellulose (REFRESH TEARS) 0.5 % SOLN Place 1 drop into both eyes 4 (four) times daily.   cetirizine (ZYRTEC) 10 MG tablet Take 10 mg by mouth in the morning.   Cholecalciferol (VITAMIN D3) 1000 units CAPS Take 1,000 Units by mouth at bedtime.   Fe Fum-Vit C-Vit B12-FA (TRIGELS-F FORTE) CAPS capsule Take 1 capsule by mouth daily after breakfast.   gabapentin (NEURONTIN) 100 MG capsule Take 2 capsules (200 mg total) by mouth 3 (three) times daily.   levothyroxine (SYNTHROID) 100 MCG tablet Take 1 tablet (100 mcg total) by mouth daily.   Magnesium 250 MG TABS Take 250 mg by mouth at bedtime.   Multiple Vitamin (MULTIVITAMIN WITH MINERALS) TABS tablet Take 1 tablet by mouth every evening.   Probiotic Product (PROBIOTIC PEARLS WOMENS PO) Take 1 capsule by mouth daily after breakfast.     Review of Systems      All other systems reviewed and are otherwise negative except as noted above.  Physical Exam    VS:  BP 108/70   Pulse 74   Ht 5\' 7"  (1.702 m)   Wt 144 lb 6.4 oz (65.5 kg)   SpO2 98%   BMI 22.62 kg/m  , BMI Body mass index is 22.62 kg/m.  Wt Readings from Last 3 Encounters:  10/15/22 144 lb 6.4 oz (65.5 kg)  09/29/22 146 lb 1.6 oz (66.3 kg)  09/22/22 148 lb 11.2 oz (67.4 kg)     GEN: Well nourished, well developed, in no acute distress. HEENT: normal. Neck: Supple, no JVD, carotid bruits, or masses. Cardiac: RRR, no murmurs, rubs, or gallops. No clubbing, cyanosis, edema.  Radials/PT 2+ and equal bilaterally.  Respiratory:  Respirations regular and unlabored, clear to auscultation bilaterally. GI: Soft, nontender, nondistended. MS: No deformity or atrophy. Skin: Warm and dry, no rash. Small scab around her chest tube incision cut today Neuro:  Strength and sensation are intact. Psych: Normal affect.  Assessment & Plan    Severe Aortic Insuffiencey s/p AVR -Doing really well postsurgery -Continue current medications which include amiodarone  400 mg daily, aspirin 325 mg daily, Lopressor 12.5 mg twice daily -Amoxicillin prescription already sent for her dental appointments -She has follow-up with Dr. Cyndia Bent 2/14 and a lower dose of amiodarone can be discussed at that appointment -She is not currently requiring diuretic therapy  Hyperlipidemia -We will plan to check a lipid panel when  she is back in the office -last LDL 113 -no on statin therapy currently        Cardiac Rehabilitation Eligibility Assessment  The patient is ready to start cardiac rehabilitation pending clearance from the cardiac surgeon.   Disposition: Follow up 3 months with Dietrich Pates, MD or APP.  Signed, Sharlene Dory, PA-C 10/15/2022, 5:17 PM [facility]

## 2022-10-15 ENCOUNTER — Ambulatory Visit: Payer: Medicare Other | Attending: Physician Assistant | Admitting: Physician Assistant

## 2022-10-15 ENCOUNTER — Encounter: Payer: Self-pay | Admitting: Internal Medicine

## 2022-10-15 ENCOUNTER — Encounter: Payer: Self-pay | Admitting: Physician Assistant

## 2022-10-15 VITALS — BP 108/70 | HR 74 | Ht 67.0 in | Wt 144.4 lb

## 2022-10-15 DIAGNOSIS — Z952 Presence of prosthetic heart valve: Secondary | ICD-10-CM | POA: Diagnosis not present

## 2022-10-15 DIAGNOSIS — I359 Nonrheumatic aortic valve disorder, unspecified: Secondary | ICD-10-CM

## 2022-10-15 DIAGNOSIS — E782 Mixed hyperlipidemia: Secondary | ICD-10-CM | POA: Diagnosis not present

## 2022-10-15 NOTE — Telephone Encounter (Signed)
Please put lab orders in, patient called and labs scheduled

## 2022-10-15 NOTE — Telephone Encounter (Signed)
Orders are in from 07/2022

## 2022-10-15 NOTE — Patient Instructions (Signed)
Medication Instructions:  Your physician recommends that you continue on your current medications as directed. Please refer to the Current Medication list given to you today.  *If you need a refill on your cardiac medications before your next appointment, please call your pharmacy*   Lab Work: None ordered If you have labs (blood work) drawn today and your tests are completely normal, you will receive your results only by: Max (if you have MyChart) OR A paper copy in the mail If you have any lab test that is abnormal or we need to change your treatment, we will call you to review the results.   Testing/Procedures: Echo as scheduled on 11/05/22 at 1:05 PM   Follow-Up: At [facility], you and your health needs are our priority.  As part of our continuing mission to provide you with exceptional heart care, we have created designated Provider Care Teams.  These Care Teams include your primary Cardiologist (physician) and Advanced Practice Providers (APPs -  Physician Assistants and Nurse Practitioners) who all work together to provide you with the care you need, when you need it.   Your next appointment:   12/21/22 at 1:20 PM  Provider:   Dorris Carnes, MD

## 2022-10-22 ENCOUNTER — Ambulatory Visit: Payer: Medicare Other

## 2022-10-26 ENCOUNTER — Other Ambulatory Visit: Payer: Self-pay | Admitting: Surgery

## 2022-10-26 DIAGNOSIS — Z952 Presence of prosthetic heart valve: Secondary | ICD-10-CM

## 2022-10-27 ENCOUNTER — Other Ambulatory Visit: Payer: Self-pay | Admitting: *Deleted

## 2022-10-27 ENCOUNTER — Encounter: Payer: Self-pay | Admitting: Surgery

## 2022-10-27 ENCOUNTER — Ambulatory Visit (INDEPENDENT_AMBULATORY_CARE_PROVIDER_SITE_OTHER): Payer: Self-pay | Admitting: Surgery

## 2022-10-27 ENCOUNTER — Ambulatory Visit
Admission: RE | Admit: 2022-10-27 | Discharge: 2022-10-27 | Disposition: A | Discharge status: Home / Self Care | Payer: Medicare Other | Source: Ambulatory Visit | Attending: Surgery | Admitting: Surgery

## 2022-10-27 VITALS — BP 125/82 | HR 77 | Resp 20 | Ht 67.0 in | Wt 145.0 lb

## 2022-10-27 DIAGNOSIS — Z952 Presence of prosthetic heart valve: Secondary | ICD-10-CM

## 2022-10-27 MED ORDER — LEVOTHYROXINE SODIUM 100 MCG PO TABS: 100.0000 ug | ORAL_TABLET | Freq: Every day | ORAL | 1 refills | Status: DC

## 2022-10-27 NOTE — Progress Notes (Signed)
Amb referral placed to [facility] per Dr. Cyndia Bent.

## 2022-10-27 NOTE — Progress Notes (Signed)
HPI: Patient returns for routine postoperative follow-up having undergone aortic valve replacement using a 23 mm Edwards INSPIRIS RESILIA pericardial valve on 09/24/2022. The patient's early postoperative recovery while in the hospital was notable for development of postoperative atrial fibrillation converted on amiodarone.  She is still taking 400 mg once daily. Since hospital discharge the patient reports that she has been feeling well.  She is walking at least 1 mile per day without chest pain or shortness of breath.  She has not noticed any irregular heart rate or tachypalpitations.   Current Outpatient Medications  Medication Sig Dispense Refill   amiodarone (PACERONE) 400 MG tablet Take 1 tablet (400 mg total) by mouth daily. 30 tablet 1   amoxicillin (AMOXIL) 500 MG capsule Take 500 mg by mouth once. Pt takes for dental appointment.     aspirin EC 325 MG tablet Take 1 tablet (325 mg total) by mouth daily.     carboxymethylcellulose (REFRESH TEARS) 0.5 % SOLN Place 1 drop into both eyes 4 (four) times daily.     cetirizine (ZYRTEC) 10 MG tablet Take 10 mg by mouth in the morning.     Cholecalciferol (VITAMIN D3) 1000 units CAPS Take 1,000 Units by mouth at bedtime.     Fe Fum-Vit C-Vit B12-FA (TRIGELS-F FORTE) CAPS capsule Take 1 capsule by mouth daily after breakfast. 30 capsule 1   gabapentin (NEURONTIN) 100 MG capsule Take 2 capsules (200 mg total) by mouth 3 (three) times daily. 180 capsule 1   levothyroxine (SYNTHROID) 100 MCG tablet Take 1 tablet (100 mcg total) by mouth daily. 30 tablet 1   Magnesium 250 MG TABS Take 250 mg by mouth at bedtime.     Multiple Vitamin (MULTIVITAMIN WITH MINERALS) TABS tablet Take 1 tablet by mouth every evening.     Probiotic Product (PROBIOTIC PEARLS WOMENS PO) Take 1 capsule by mouth daily after breakfast.     No current facility-administered medications for this visit.    Physical Exam: BP 125/82   Pulse 77   Resp 20   Ht 5' 7"$  (1.702 m)    Wt 145 lb (65.8 kg)   SpO2 97% Comment: RA  BMI 22.71 kg/m  She looks well. Cardiac exam shows regular rate and rhythm with normal heart sounds.  There is no murmur. Lungs are clear. Chest incision is healing well and the sternum is stable. There is no peripheral edema.  Diagnostic Tests:  Narrative & Impression  CLINICAL DATA:  Status post aortic valve repair.   EXAM: CHEST - 2 VIEW   COMPARISON:  September 26, 2022.   FINDINGS: The heart size and mediastinal contours are within normal limits. Both lungs are clear. Sternotomy wires are noted. The visualized skeletal structures are unremarkable.   IMPRESSION: No active cardiopulmonary disease.     Electronically Signed   By: Marijo Conception M.D.   On: 10/27/2022 10:41      Impression:  Overall she has made a very nice recovery following her aortic valve replacement surgery.  She appears to be maintaining sinus rhythm and I asked her to decrease the amiodarone to 200 mg once daily for 3 more weeks and then it can be discontinued.  Her TSH level in the hospital prior to starting amiodarone was elevated at 5.225 from her previous level in November 2023 when it was 2.98.  Since she was started on amiodarone we increased her Synthroid to 100 mcg daily from her previous dose of 88 mcg daily.  I  told her to continue this dose for now and it can be followed up by her PCP.  I told her that she can return to driving a car but should refrain lifting anything heavier than 10 pounds for 3 months postoperatively.  We will refer her to cardiac rehab at [facility] and I think she could start that at any time.  Plan:  She has a follow-up appointment in the near future with Dr. Harrington Challenger.  She will return to see me if she has any problems with her incision.   Gaye Pollack, MD [facility] [phone]

## 2022-11-04 ENCOUNTER — Other Ambulatory Visit: Payer: Self-pay | Admitting: Internal Medicine

## 2022-11-04 DIAGNOSIS — B029 Zoster without complications: Secondary | ICD-10-CM

## 2022-11-04 DIAGNOSIS — M792 Neuralgia and neuritis, unspecified: Secondary | ICD-10-CM

## 2022-11-05 ENCOUNTER — Ambulatory Visit (HOSPITAL_COMMUNITY): Payer: Medicare Other | Attending: Internal Medicine

## 2022-11-05 DIAGNOSIS — Z952 Presence of prosthetic heart valve: Secondary | ICD-10-CM | POA: Insufficient documentation

## 2022-11-05 LAB — ECHOCARDIOGRAM COMPLETE
AV Mean grad: 13 mmHg
AV Peak grad: 20.7 mmHg
Ao pk vel: 2.27 m/s
Area-P 1/2: 2.54 cm2
MV M vel: 5.7 m/s
MV Peak grad: 130 mmHg
S' Lateral: 2.3 cm

## 2022-11-08 ENCOUNTER — Encounter: Payer: Self-pay | Admitting: Internal Medicine

## 2022-11-09 NOTE — Telephone Encounter (Signed)
Spoke to patient to review findings   LV function vigorous on echo   Will follow MR and also septal motion over time    She has an appt to see me in April

## 2022-12-21 ENCOUNTER — Ambulatory Visit: Payer: Medicare Other | Attending: Internal Medicine | Admitting: Internal Medicine

## 2022-12-21 ENCOUNTER — Encounter: Payer: Self-pay | Admitting: Internal Medicine

## 2022-12-21 VITALS — BP 126/84 | HR 78 | Ht 67.0 in | Wt 146.6 lb

## 2022-12-21 DIAGNOSIS — I359 Nonrheumatic aortic valve disorder, unspecified: Secondary | ICD-10-CM | POA: Diagnosis present

## 2022-12-21 DIAGNOSIS — Z952 Presence of prosthetic heart valve: Secondary | ICD-10-CM | POA: Insufficient documentation

## 2022-12-21 DIAGNOSIS — I351 Nonrheumatic aortic (valve) insufficiency: Secondary | ICD-10-CM

## 2022-12-21 NOTE — Progress Notes (Signed)
Cardiology Office Note   Date:  12/21/2022   ID:  Theresa SongRebecca Wright Pizano, DOB [birth year], MRN [id number]  PCP:  Sherlene Shamsullo, Teresa L, MD  Cardiologist:   Dietrich PatesPaula Taurean Ju, MD   F/U of AV dz     History of Present Illness: Theresa [name] is a 67 y.o. female with a history of quadricuspid AV      In Oct 2023 echo showed AI was mod to severe   REferred to [facility].  R/L heart cath showed minimal irregularities to RCA    Underwent AVR on 09/24/22   Post op had afib  She was seen most recently in cardiology on 10/15/22 by Margaretha Glassing Conte  The pt says she is feeing good   Breathing is good   No CP     NO palpitations   Current Meds  Medication Sig   ALPRAZolam (XANAX) 0.25 MG tablet Take 0.25 mg by mouth at bedtime as needed for anxiety.   amoxicillin (AMOXIL) 500 MG capsule Take 500 mg by mouth once. Pt takes for dental appointment.   aspirin EC 325 MG tablet Take 1 tablet (325 mg total) by mouth daily.   carboxymethylcellulose (REFRESH TEARS) 0.5 % SOLN Place 1 drop into both eyes 4 (four) times daily.   cetirizine (ZYRTEC) 10 MG tablet Take 10 mg by mouth in the morning.   Cholecalciferol (VITAMIN D3) 1000 units CAPS Take 1,000 Units by mouth at bedtime.   gabapentin (NEURONTIN) 100 MG capsule TAKE 2 CAPSULES BY MOUTH THREE TIMES DAILY   levothyroxine (SYNTHROID) 100 MCG tablet Take 1 tablet (100 mcg total) by mouth daily.   Magnesium 250 MG TABS Take 250 mg by mouth at bedtime.   Multiple Vitamin (MULTIVITAMIN WITH MINERALS) TABS tablet Take 1 tablet by mouth every evening.   Probiotic Product (PROBIOTIC PEARLS WOMENS PO) Take 1 capsule by mouth daily after breakfast.     Allergies:   Mobic [meloxicam], Topamax [topiramate], and Tramadol   Past Medical History:  Diagnosis Date   Breast screening, unspecified    H/O: Bell's palsy    recurrent   Headache    Heart murmur    History of kidney stones    Hypercalcemia    Osteoporosis    Screening for malignant neoplasm of the cervix    Special  screening for malignant neoplasms, colon    Unspecified hypothyroidism    thyroid nodule   Unspecified otitis media     Past Surgical History:  Procedure Laterality Date   ANTERIOR CERVICAL DECOMP/DISCECTOMY FUSION  13,15   C3 ,4 ,5 [facility]   ANTERIOR FUSION CERVICAL SPINE  2013   [facility]  c5-c6   AORTIC VALVE REPLACEMENT N/A 09/24/2022   Procedure: AORTIC VALVE REPLACEMENT (AVR) USING EDWARDS INSPIRIS RESILIA 23 MM AORTIC VALVE;  Surgeon: Alleen BorneBartle, Bryan K, MD;  Location: [facility];  Service: Open Heart Surgery;  Laterality: N/A;  Median sternotomy   CHOLECYSTECTOMY  1989   dental implant     MENISCUS REPAIR Right 01/2010   [facility]   RIGHT/LEFT HEART CATH AND CORONARY ANGIOGRAPHY N/A 09/01/2022   Procedure: RIGHT/LEFT HEART CATH AND CORONARY ANGIOGRAPHY;  Surgeon: Tonny Bollmanooper, Michael, MD;  [facility];  Service: Cardiovascular;  Laterality: N/A;   SPINE SURGERY     [facility], C3, 4 5   TEE WITHOUT CARDIOVERSION N/A 09/24/2022   Procedure: TRANSESOPHAGEAL ECHOCARDIOGRAM (TEE);  Surgeon: Alleen BorneBartle, Bryan K, MD;  [facility];  Service: Open Heart Surgery;  Laterality: N/A;  TOTAL ABDOMINAL HYSTERECTOMY     secondary to fibroids still has ovaries   TOTAL HIP ARTHROPLASTY Left 08/31/2021   Procedure: TOTAL HIP ARTHROPLASTY;  Surgeon: Donato Heinz, MD;  Location: [facility] ORS;  Service: Orthopedics;  Laterality: Left;     Social History:  The patient  reports that she has never smoked. She has never used smokeless tobacco. She reports current alcohol use of about 5.0 standard drinks of alcohol per week. She reports that she does not use drugs.   Family History:  The patient's family history includes Alzheimer's disease in her mother; Breast cancer in her paternal aunt; Breast cancer (age of onset: 65) in her sister; Cancer (age of onset: 4) in her sister; Hyperlipidemia in her father; Thyroid nodules in her maternal grandmother, mother, and sister.    ROS:  Please see the history of  present illness. All other systems are reviewed and  Negative to the above problem except as noted.    PHYSICAL EXAM: VS:  BP 126/84   Pulse 78   Ht 5\' 7"  (1.702 m)   Wt 146 lb 9.6 oz (66.5 kg)   SpO2 99%   BMI 22.96 kg/m   GEN: Well nourished, well developed, in no acute distress  HEENT: normal  Neck: no JVD, carotid bruit Cardiac: RRR  Gr I-II/VI systolic murmur LSB and apex  No LE edema  Respiratory:  clear to auscultation bilaterally,  GI: soft, nontender, nondistended,   No hepatomegaly    EKG:  EKG is ordered today.  NSR 63 bpm Occasional PVC  nonspecific ST changes     Echo  Feb 2024  1. Left ventricular ejection fraction, by estimation, is 55 to 60%. The  left ventricle has normal function. The left ventricle has no regional  wall motion abnormalities. Left ventricular diastolic parameters are  consistent with Grade I diastolic  dysfunction (impaired relaxation).   2. Right ventricular systolic function is normal. The right ventricular  size is normal.   3. Mitral valve prolapse with MR best seen in the 2 chamber color view.  The mitral valve is grossly normal. Mild to moderate mitral valve  regurgitation.   4. The aortic valve has been repaired/replaced. Aortic valve  regurgitation is not visualized. Echo findings are consistent with normal  structure and function of the aortic valve prosthesis.   5. The inferior vena cava is normal in size with greater than 50%  respiratory variability, suggesting right atrial pressure of 3 mmHg.   Echo  Sept 2022  Left ventricular ejection fraction, by estimation, is 65 to 70%. Left ventricular ejection fraction by 3D volume is 70 %. The left ventricle has normal function. The left ventricle has no regional wall motion abnormalities. Left ventricular diastolic parameters are consistent with Grade I diastolic dysfunction (impaired relaxation). 1. Right ventricular systolic function is normal. The right ventricular size is  normal. There is normal pulmonary artery systolic pressure. The estimated right ventricular systolic pressure is 28.2 mmHg. 2. The mitral valve is normal in structure. Mild mitral valve regurgitation. No evidence of mitral stenosis. 3. The aortic valve is abnormal. Quadracuspid aortic valve. Although the PHT is consistent with mild AI, visually there AI appears moderate.Aortic valve regurgitation is mild to moderate. No aortic stenosis is present. Aortic regurgitation PHT measures 487 msec. 4. The inferior vena cava is normal in size with greater than 50% respiratory variability, suggesting right atrial pressure of 3 mmHg. 5. 6. Compared to study 05/08/2020 there is no change.  Lipid Panel    Component Value Date/Time   CHOL 212 (H) 07/21/2022 0733   TRIG 112 07/21/2022 0733   HDL 77 07/21/2022 0733   CHOLHDL 2.8 07/21/2022 0733   VLDL 12 01/28/2017 0900   LDLCALC 113 (H) 07/21/2022 0733   LDLDIRECT 133 (H) 07/21/2022 0733      Wt Readings from Last 3 Encounters:  12/21/22 146 lb 9.6 oz (66.5 kg)  10/27/22 145 lb (65.8 kg)  10/15/22 144 lb 6.4 oz (65.5 kg)      ASSESSMENT AND PLAN:  1  AV dz   Pt with hx quadricuspid AV and AI   S/P AVR   Doing well   Echo post procedure valve working well       2  Mitral regurgitation   Reviewed echo images with patient   I think MR is mild  MVP subtle   Will follow up in 1 year     3  Blood pressure  Controlled     Preop evaluation  Patient is about to have an orthopedic procedure later this winter   From a cardiac standpoint I think she is low risk and OK to proceed  Current medicines are reviewed at length with the patient today.  The patient does not have concerns regarding medicines.  Signed, Dietrich Pates, MD  12/21/2022 1:21 PM    [facility] [address] [place], [place], [place]  [address] Phone: [phone]; Fax: [phone]

## 2022-12-21 NOTE — Patient Instructions (Signed)
Medication Instructions:   *If you need a refill on your cardiac medications before your next appointment, please call your pharmacy*   Lab Work:  If you have labs (blood work) drawn today and your tests are completely normal, you will receive your results only by: MyChart Message (if you have MyChart) OR A paper copy in the mail If you have any lab test that is abnormal or we need to change your treatment, we will call you to review the results.   Testing/Procedures: IN FEBRUARY PRIOR TO SEEING DR ROSS  Your physician has requested that you have an echocardiogram. Echocardiography is a painless test that uses sound waves to create images of your heart. It provides your doctor with information about the size and shape of your heart and how well your heart's chambers and valves are working. This procedure takes approximately one hour. There are no restrictions for this procedure. Please do NOT wear cologne, perfume, aftershave, or lotions (deodorant is allowed). Please arrive 15 minutes prior to your appointment time.    Follow-Up: At [facility], you and your health needs are our priority.  As part of our continuing mission to provide you with exceptional heart care, we have created designated Provider Care Teams.  These Care Teams include your primary Cardiologist (physician) and Advanced Practice Providers (APPs -  Physician Assistants and Nurse Practitioners) who all work together to provide you with the care you need, when you need it.  We recommend signing up for the patient portal called "MyChart".  Sign up information is provided on this After Visit Summary.  MyChart is used to connect with patients for Virtual Visits (Telemedicine).  Patients are able to view lab/test results, encounter notes, upcoming appointments, etc.  Non-urgent messages can be sent to your provider as well.   To learn more about what you can do with MyChart, go to [email or web address].    Your next  appointment:   9 month(s)  Provider:   Dietrich Pates, MD     Other Instructions

## 2023-01-19 ENCOUNTER — Other Ambulatory Visit (INDEPENDENT_AMBULATORY_CARE_PROVIDER_SITE_OTHER): Payer: Medicare Other

## 2023-01-19 DIAGNOSIS — E034 Atrophy of thyroid (acquired): Secondary | ICD-10-CM

## 2023-01-19 DIAGNOSIS — E785 Hyperlipidemia, unspecified: Secondary | ICD-10-CM | POA: Diagnosis not present

## 2023-01-19 DIAGNOSIS — Z79899 Other long term (current) drug therapy: Secondary | ICD-10-CM

## 2023-01-19 NOTE — Addendum Note (Signed)
Addended by: Warden Fillers on: 01/19/2023 07:36 AM   Modules accepted: Orders

## 2023-01-20 LAB — COMPLETE METABOLIC PANEL WITH GFR
AG Ratio: 1.7 (calc) (ref 1.0–2.5)
ALT: 16 U/L (ref 6–29)
AST: 20 U/L (ref 10–35)
Albumin: 4.2 g/dL (ref 3.6–5.1)
Alkaline phosphatase (APISO): 72 U/L (ref 37–153)
BUN: 12 mg/dL (ref 7–25)
CO2: 26 mmol/L (ref 20–32)
Calcium: 9.6 mg/dL (ref 8.6–10.4)
Chloride: 100 mmol/L (ref 98–110)
Creat: 0.99 mg/dL (ref 0.50–1.05)
Globulin: 2.5 g/dL (calc) (ref 1.9–3.7)
Glucose, Bld: 77 mg/dL (ref 65–99)
Potassium: 4.2 mmol/L (ref 3.5–5.3)
Sodium: 141 mmol/L (ref 135–146)
Total Bilirubin: 0.5 mg/dL (ref 0.2–1.2)
Total Protein: 6.7 g/dL (ref 6.1–8.1)
eGFR: 64 mL/min/{1.73_m2} (ref >=60)

## 2023-01-20 LAB — LIPID PANEL
Cholesterol: 234 mg/dL — ABNORMAL HIGH (ref <200)
HDL: 87 mg/dL (ref >=50)
LDL Cholesterol (Calc): 128 mg/dL (calc) — ABNORMAL HIGH
Non-HDL Cholesterol (Calc): 147 mg/dL (calc) — ABNORMAL HIGH (ref <130)
Total CHOL/HDL Ratio: 2.7 (calc) (ref <5.0)
Triglycerides: 91 mg/dL (ref <150)

## 2023-01-20 LAB — LDL CHOLESTEROL, DIRECT: Direct LDL: 143 mg/dL — ABNORMAL HIGH (ref <100)

## 2023-01-20 LAB — TSH: TSH: 3.43 mIU/L (ref 0.40–4.50)

## 2023-01-21 ENCOUNTER — Other Ambulatory Visit: Payer: Self-pay | Admitting: Internal Medicine

## 2023-01-21 MED ORDER — LEVOTHYROXINE SODIUM 100 MCG PO TABS: 100.0000 ug | ORAL_TABLET | Freq: Every day | ORAL | 1 refills | Status: DC

## 2023-01-24 ENCOUNTER — Encounter: Payer: Self-pay | Admitting: Internal Medicine

## 2023-01-24 ENCOUNTER — Ambulatory Visit (INDEPENDENT_AMBULATORY_CARE_PROVIDER_SITE_OTHER): Payer: Medicare Other | Admitting: Internal Medicine

## 2023-01-24 VITALS — BP 122/84 | HR 71 | Temp 97.9°F | Ht 67.0 in | Wt 145.8 lb

## 2023-01-24 DIAGNOSIS — M792 Neuralgia and neuritis, unspecified: Secondary | ICD-10-CM | POA: Diagnosis not present

## 2023-01-24 DIAGNOSIS — B029 Zoster without complications: Secondary | ICD-10-CM | POA: Diagnosis not present

## 2023-01-24 DIAGNOSIS — Q249 Congenital malformation of heart, unspecified: Secondary | ICD-10-CM

## 2023-01-24 DIAGNOSIS — E785 Hyperlipidemia, unspecified: Secondary | ICD-10-CM | POA: Diagnosis not present

## 2023-01-24 DIAGNOSIS — Z952 Presence of prosthetic heart valve: Secondary | ICD-10-CM | POA: Diagnosis not present

## 2023-01-24 DIAGNOSIS — E034 Atrophy of thyroid (acquired): Secondary | ICD-10-CM

## 2023-01-24 MED ORDER — GABAPENTIN 100 MG PO CAPS: 200.0000 mg | ORAL_CAPSULE | Freq: Three times a day (TID) | ORAL | 5 refills | Status: DC

## 2023-01-24 NOTE — Assessment & Plan Note (Addendum)
Done by Laneta Simmers in February without complication.  She has returned to normal activities . POST AVR ECHO noted no AR,  EF 50 to 55% and mild MR

## 2023-01-24 NOTE — Assessment & Plan Note (Signed)
Based on current lipid profile, the risk of clinically significant CAD is 22% over the next 10 years, using the  AHA risk calculator is  4%

## 2023-01-24 NOTE — Patient Instructions (Signed)
I'm glad you are doing so well !!  Your 10 yr risk assessment for cardiac events  using the AHA risk calculator is 4% , and there is no evidence of atherosclerosis by prior imaging.  No treatment is required.   Continue healthy mediterranean low glycemic index diet and regular participation in low impact aerobic exercise    See you in 6 months

## 2023-01-24 NOTE — Progress Notes (Signed)
Subjective:  Patient ID: Theresa [name], female    DOB: Sep 07, 1956  Age: 67 y.o. MRN: [id number]  CC: The primary encounter diagnosis was S/P AVR (aortic valve replacement). Diagnoses of Hyperlipidemia LDL goal <160, Nerve pain, Herpes zoster without complication, Hypothyroidism due to acquired atrophy of thyroid, and Congenital heart defect were also pertinent to this visit.   HPI Theresa [name] presents for  Chief Complaint  Patient presents with   Medical Management of Chronic Issues   1) s/p AVR  Feb 2024 by CRS  [place].  Remained in house 5 days.  Wounds healed without complications.  Some referred pain to left scapula,  but chest wall without pain.  Was Released by CTS at follow up.  Dietrich Pates  now managing,  has resumed walking.    Antibiotic prophylaxis pre procedure reviewed.   2) mitral regurgitation noted on most recent ECHO ; diagnosis and prognosis  reviewed   2) Hypothyroid  3) HLD  recent lipid panel reviewed  .    10 yr risk 4%    4) chronic neck and back pain : using  gabapentin . Lifestyle modifications in place   Outpatient Medications Prior to Visit  Medication Sig Dispense Refill   ALPRAZolam (XANAX) 0.25 MG tablet Take 0.25 mg by mouth at bedtime as needed for anxiety.     amoxicillin (AMOXIL) 500 MG capsule Take 500 mg by mouth once. Pt takes for dental appointment.     aspirin EC 325 MG tablet Take 1 tablet (325 mg total) by mouth daily.     carboxymethylcellulose (REFRESH TEARS) 0.5 % SOLN Place 1 drop into both eyes 4 (four) times daily.     cetirizine (ZYRTEC) 10 MG tablet Take 10 mg by mouth in the morning.     Cholecalciferol (VITAMIN D3) 1000 units CAPS Take 1,000 Units by mouth at bedtime.     levothyroxine (SYNTHROID) 100 MCG tablet Take 1 tablet (100 mcg total) by mouth daily. 90 tablet 1   Magnesium 250 MG TABS Take 250 mg by mouth at bedtime.     Multiple Vitamin (MULTIVITAMIN WITH MINERALS) TABS tablet Take 1 tablet by mouth every  evening.     Probiotic Product (PROBIOTIC PEARLS WOMENS PO) Take 1 capsule by mouth daily after breakfast.     gabapentin (NEURONTIN) 100 MG capsule TAKE 2 CAPSULES BY MOUTH THREE TIMES DAILY 180 capsule 0   No facility-administered medications prior to visit.    Review of Systems;  Patient denies headache, fevers, malaise, unintentional weight loss, skin rash, eye pain, sinus congestion and sinus pain, sore throat, dysphagia,  hemoptysis , cough, dyspnea, wheezing, chest pain, palpitations, orthopnea, edema, abdominal pain, nausea, melena, diarrhea, constipation, flank pain, dysuria, hematuria, urinary  Frequency, nocturia, numbness, tingling, seizures,  Focal weakness, Loss of consciousness,  Tremor, insomnia, depression, anxiety, and suicidal ideation.      Objective:  BP 122/84   Pulse 71   Temp 97.9 F (36.6 C) (Oral)   Ht 5\' 7"  (1.702 m)   Wt 145 lb 12.8 oz (66.1 kg)   SpO2 97%   BMI 22.84 kg/m   BP Readings from Last 3 Encounters:  01/24/23 122/84  12/21/22 126/84  10/27/22 125/82    Wt Readings from Last 3 Encounters:  01/24/23 145 lb 12.8 oz (66.1 kg)  12/21/22 146 lb 9.6 oz (66.5 kg)  10/27/22 145 lb (65.8 kg)    Physical Exam Vitals reviewed.  Constitutional:      General: She  is not in acute distress.    Appearance: Normal appearance. She is normal weight. She is not ill-appearing, toxic-appearing or diaphoretic.  HENT:     Head: Normocephalic.  Eyes:     General: No scleral icterus.       Right eye: No discharge.        Left eye: No discharge.     Conjunctiva/sclera: Conjunctivae normal.  Cardiovascular:     Rate and Rhythm: Normal rate and regular rhythm.     Heart sounds: Murmur heard.  Pulmonary:     Effort: Pulmonary effort is normal. No respiratory distress.     Breath sounds: Normal breath sounds.  Chest:       Comments: Well healed midline surgical scar  Abdominal:     General: Abdomen is flat. Bowel sounds are normal.  Musculoskeletal:         General: Normal range of motion.  Skin:    General: Skin is warm and dry.  Neurological:     General: No focal deficit present.     Mental Status: She is alert and oriented to person, place, and time. Mental status is at baseline.  Psychiatric:        Mood and Affect: Mood normal.        Behavior: Behavior normal.        Thought Content: Thought content normal.        Judgment: Judgment normal.    Lab Results  Component Value Date   HGBA1C 5.4 09/22/2022   HGBA1C 5.6 07/21/2022    Lab Results  Component Value Date   CREATININE 0.99 01/19/2023   CREATININE 0.70 09/27/2022   CREATININE 0.59 09/26/2022    Lab Results  Component Value Date   WBC 10.1 09/27/2022   HGB 9.5 (L) 09/27/2022   HCT 27.5 (L) 09/27/2022   PLT 155 09/27/2022   GLUCOSE 77 01/19/2023   CHOL 234 (H) 01/19/2023   TRIG 91 01/19/2023   HDL 87 01/19/2023   LDLDIRECT 143 (H) 01/19/2023   LDLCALC 128 (H) 01/19/2023   ALT 16 01/19/2023   AST 20 01/19/2023   NA 141 01/19/2023   K 4.2 01/19/2023   CL 100 01/19/2023   CREATININE 0.99 01/19/2023   BUN 12 01/19/2023   CO2 26 01/19/2023   TSH 3.43 01/19/2023   INR 1.4 (H) 09/24/2022   HGBA1C 5.4 09/22/2022   MICROALBUR 0.6 07/21/2022    DG Chest 2 View  Result Date: 10/27/2022 CLINICAL DATA:  Status post aortic valve repair. EXAM: CHEST - 2 VIEW COMPARISON:  September 26, 2022. FINDINGS: The heart size and mediastinal contours are within normal limits. Both lungs are clear. Sternotomy wires are noted. The visualized skeletal structures are unremarkable. IMPRESSION: No active cardiopulmonary disease. Electronically Signed   By: Lupita Raider M.D.   On: 10/27/2022 10:41    Assessment & Plan:  .S/P AVR (aortic valve replacement) Assessment & Plan: Done by Laneta Simmers in February without complication.  She has returned to normal activities . POST AVR ECHO noted no AR,  EF 50 to 55% and mild MR   Hyperlipidemia LDL goal <160 Assessment & Plan: Based  on current lipid profile, the risk of clinically significant CAD is 22% over the next 10 years, using the  AHA risk calculator is  4%   Orders: -     Comprehensive metabolic panel; Future -     Lipid panel; Future  Nerve pain -     Gabapentin; Take 2 capsules (  200 mg total) by mouth 3 (three) times daily.  Dispense: 180 capsule; Refill: 5  Herpes zoster without complication -     Gabapentin; Take 2 capsules (200 mg total) by mouth 3 (three) times daily.  Dispense: 180 capsule; Refill: 5  Hypothyroidism due to acquired atrophy of thyroid -     TSH; Future  Congenital heart defect Assessment & Plan: Quadruple leaflet aortic valve/aortic insufficiency .  S/p AVR Jan 2024      I provided 34 minutes of face-to-face time during this encounter reviewing patient's last visit with me, patient's  most recent visit with cardiothoracic surgery, cardiology,   recent surgical and non surgical procedures, previous  labs and imaging studies, counseling on currently addressed issues,  and post visit ordering to diagnostics and therapeutics .   Follow-up: Return in about 6 months (around 07/27/2023) for physical.   Sherlene Shams, MD

## 2023-01-24 NOTE — Assessment & Plan Note (Signed)
Quadruple leaflet aortic valve/aortic insufficiency .  S/p AVR Jan 2024

## 2023-02-08 ENCOUNTER — Other Ambulatory Visit: Payer: Self-pay | Admitting: Student

## 2023-02-08 DIAGNOSIS — M1712 Unilateral primary osteoarthritis, left knee: Secondary | ICD-10-CM

## 2023-02-08 DIAGNOSIS — M25562 Pain in left knee: Secondary | ICD-10-CM

## 2023-02-08 DIAGNOSIS — M25462 Effusion, left knee: Secondary | ICD-10-CM

## 2023-02-08 DIAGNOSIS — S83282A Other tear of lateral meniscus, current injury, left knee, initial encounter: Secondary | ICD-10-CM

## 2023-02-12 ENCOUNTER — Ambulatory Visit
Admission: RE | Admit: 2023-02-12 | Discharge: 2023-02-12 | Disposition: A | Discharge status: Home / Self Care | Payer: Medicare Other | Source: Ambulatory Visit | Attending: Student | Admitting: Student

## 2023-02-12 DIAGNOSIS — S83282A Other tear of lateral meniscus, current injury, left knee, initial encounter: Secondary | ICD-10-CM

## 2023-02-12 DIAGNOSIS — M1712 Unilateral primary osteoarthritis, left knee: Secondary | ICD-10-CM

## 2023-02-12 DIAGNOSIS — M25562 Pain in left knee: Secondary | ICD-10-CM

## 2023-02-12 DIAGNOSIS — M25462 Effusion, left knee: Secondary | ICD-10-CM

## 2023-02-14 ENCOUNTER — Other Ambulatory Visit: Payer: Self-pay | Admitting: Internal Medicine

## 2023-02-19 ENCOUNTER — Other Ambulatory Visit: Payer: Medicare Other

## 2023-02-23 ENCOUNTER — Encounter: Payer: Self-pay | Admitting: Internal Medicine

## 2023-02-23 ENCOUNTER — Telehealth: Payer: Self-pay | Admitting: *Deleted

## 2023-02-23 MED ORDER — AMOXICILLIN 500 MG PO TABS: ORAL_TABLET | ORAL | 6 refills | Status: DC

## 2023-02-23 NOTE — Telephone Encounter (Signed)
   Pre-operative Risk Assessment    Patient Name: Theresa [name]  DOB: 1956-04-09 MRN: [id number]     Request for Surgical Clearance    Procedure:   REGULAR CLEANING PER FULLER DENTAL  Date of Surgery:  Clearance 04/06/23                                 Surgeon:   Surgeon's Group or Practice Name:  [facility] Phone number:  [phone] Fax number:  [phone]   Type of Clearance Requested:   - Medical    Type of Anesthesia:  Not Indicated   Additional requests/questions:    Wilhemina Cash   02/23/2023, 1:30 PM

## 2023-02-23 NOTE — Telephone Encounter (Signed)
Discussed the use of prophylactic antibiotics before dental work and other surgeries. Prescription given for Amoxicillin 2 grams orally one hour before procedure.

## 2023-02-23 NOTE — Telephone Encounter (Signed)
   Primary Cardiologist: Dietrich Pates, MD  Chart reviewed as part of pre-operative protocol coverage. Simple dental extractions are considered low risk procedures per guidelines and generally do not require any specific cardiac clearance. It is also generally accepted that for simple extractions and dental cleanings, there is no need to interrupt blood thinner therapy.   SBE prophylaxis is required for the patient. Amoxicillin 2000 mg to take one hour prior to  procedure has been sent to patient's pharmacy.  I will route this recommendation to the requesting party via Epic fax function and remove from pre-op pool.  Please call with questions.  Levi Aland, NP-C  02/23/2023, 2:28 PM [address], [address] Office [phone] Fax [phone]

## 2023-03-24 ENCOUNTER — Encounter: Payer: Self-pay | Admitting: Internal Medicine

## 2023-03-29 NOTE — Telephone Encounter (Signed)
Spoke with pt and she stated that she thinks she jumped the gun on sending the message. She stated that drank a bunch of water that day and she now feels better.

## 2023-04-14 ENCOUNTER — Other Ambulatory Visit: Payer: Self-pay | Admitting: Internal Medicine

## 2023-04-15 NOTE — Telephone Encounter (Signed)
Requesting: Alprazolam Contract: No UDS: no Last Visit: 01/24/2023 Next Visit: 07/27/2023 Last Refill: 02/15/2023  Please Advise

## 2023-05-12 ENCOUNTER — Encounter: Payer: Self-pay | Admitting: Internal Medicine

## 2023-05-12 DIAGNOSIS — Z780 Asymptomatic menopausal state: Secondary | ICD-10-CM

## 2023-05-12 DIAGNOSIS — M85859 Other specified disorders of bone density and structure, unspecified thigh: Secondary | ICD-10-CM

## 2023-05-12 NOTE — Telephone Encounter (Signed)
Is pt due for bone density screening yet?

## 2023-05-17 ENCOUNTER — Other Ambulatory Visit: Payer: Self-pay | Admitting: Internal Medicine

## 2023-05-17 DIAGNOSIS — Z1231 Encounter for screening mammogram for malignant neoplasm of breast: Secondary | ICD-10-CM

## 2023-06-24 ENCOUNTER — Ambulatory Visit: Payer: Medicare Other | Admitting: *Deleted

## 2023-06-24 VITALS — Ht 67.0 in | Wt 146.0 lb

## 2023-06-24 DIAGNOSIS — Z0000 Encounter for general adult medical examination without abnormal findings: Secondary | ICD-10-CM

## 2023-06-24 NOTE — Patient Instructions (Signed)
Theresa [name] , Thank you for taking time to come for your Medicare Wellness Visit. I appreciate your ongoing commitment to your health goals. Please review the following plan we discussed and let me know if I can assist you in the future.   Referrals/Orders/Follow-Ups/Clinician Recommendations: Remember to update your vaccines Flu, Covid and Pneumonia  This is a list of the screening recommended for you and due dates:  Health Maintenance  Topic Date Due   Pneumonia Vaccine (1 of 1 - PCV) Never done   Flu Shot  04/14/2023   COVID-19 Vaccine (5 - 2023-24 season) 05/15/2023   Mammogram  08/19/2023   Medicare Annual Wellness Visit  06/23/2024   Colon Cancer Screening  04/30/2027   DTaP/Tdap/Td vaccine (3 - Td or Tdap) 10/14/2031   DEXA scan (bone density measurement)  Completed   Hepatitis C Screening  Completed   HIV Screening  Completed   Zoster (Shingles) Vaccine  Completed   HPV Vaccine  Aged Out    Advanced directives: (In Chart) A copy of your advanced directives are scanned into your chart should your provider ever need it.  Next Medicare Annual Wellness Visit scheduled for next year: Yes 06/27/24 @ 8:15

## 2023-06-24 NOTE — Progress Notes (Signed)
Subjective:   Theresa [name] is a 67 y.o. female who presents for Medicare Annual (Subsequent) preventive examination.  Visit Complete: Virtual I connected with  Michaelene Song on 06/24/23 by a audio enabled telemedicine application and verified that I am speaking with the correct person using two identifiers.  Patient Location: Home  Provider Location: Home Office  I discussed the limitations of evaluation and management by telemedicine. The patient expressed understanding and agreed to proceed.  Vital Signs: Because this visit was a virtual/telehealth visit, some criteria may be missing or patient reported. Any vitals not documented were not able to be obtained and vitals that have been documented are patient reported.  Patient Medicare AWV questionnaire was completed by the patient on 06/20/23; I have confirmed that all information answered by patient is correct and no changes since this date.  Cardiac Risk Factors include: advanced age (>35men, >3 women);dyslipidemia     Objective:    Today's Vitals   06/24/23 0817 06/24/23 0818  Weight: 146 lb (66.2 kg)   Height: 5\' 7"  (1.702 m)   PainSc:  4    Body mass index is 22.87 kg/m.     06/24/2023    8:39 AM 09/28/2022   12:00 AM 09/22/2022   12:07 PM 09/01/2022   11:28 AM 06/21/2022    3:43 PM 08/31/2021   11:48 AM 08/17/2021    2:03 PM  Advanced Directives  Does Patient Have a Medical Advance Directive? Yes Yes Yes Yes Yes Yes Yes  Type of Estate agent of [place];Living will Living will;Healthcare Power of Attorney Living will;Healthcare Power of Attorney Living will;Healthcare Power of [employer] Power of [place];Living will Healthcare Power of [employer] of [place];Living will  Does patient want to make changes to medical advance directive? No - Patient declined No - Patient declined No - Patient declined No - Patient declined No - Patient declined No - Patient  declined No - Patient declined  Copy of Healthcare Power of Attorney in Chart? Yes - validated most recent copy scanned in chart (See row information) No - copy requested No - copy requested No - copy requested Yes - validated most recent copy scanned in chart (See row information)  Yes - validated most recent copy scanned in chart (See row information)    Current Medications (verified) Outpatient Encounter Medications as of 06/24/2023  Medication Sig   ALPRAZolam (XANAX XR) 0.5 MG 24 hr tablet TAKE 1 TABLET BY MOUTH AT BEDTIME AS NEEDED FOR ANXIETY   amoxicillin (AMOXIL) 500 MG tablet Take 4 tablets (2 grams/ 2000 mg total) by mouth one hour prior to dental procedure.   aspirin EC 325 MG tablet Take 1 tablet (325 mg total) by mouth daily.   carboxymethylcellulose (REFRESH TEARS) 0.5 % SOLN Place 1 drop into both eyes 4 (four) times daily.   cetirizine (ZYRTEC) 10 MG tablet Take 10 mg by mouth in the morning.   Cholecalciferol (VITAMIN D3) 1000 units CAPS Take 1,000 Units by mouth at bedtime.   gabapentin (NEURONTIN) 100 MG capsule Take 2 capsules (200 mg total) by mouth 3 (three) times daily.   levothyroxine (SYNTHROID) 100 MCG tablet Take 1 tablet (100 mcg total) by mouth daily.   Magnesium 250 MG TABS Take 250 mg by mouth at bedtime.   Multiple Vitamin (MULTIVITAMIN WITH MINERALS) TABS tablet Take 1 tablet by mouth every evening.   Probiotic Product (PROBIOTIC PEARLS WOMENS PO) Take 1 capsule by mouth daily after breakfast.   [  DISCONTINUED] ALPRAZolam (XANAX) 0.25 MG tablet Take 0.25 mg by mouth at bedtime as needed for anxiety.   No facility-administered encounter medications on file as of 06/24/2023.    Allergies (verified) Mobic [meloxicam], Topamax [topiramate], and Tramadol   History: Past Medical History:  Diagnosis Date   Aortic regurgitation 02/17/2018   Noted by cardiology,  moderate to severe by repeat ECHO Nov 2023.     Atrial fibrillation (HCC) 10/04/2022   S/P AVR    Breast screening, unspecified    H/O: Bell's palsy    recurrent   Headache    Heart murmur    History of kidney stones    Hypercalcemia    Osteoporosis    Screening for malignant neoplasm of the cervix    Special screening for malignant neoplasms, colon    Unspecified hypothyroidism    thyroid nodule   Unspecified otitis media    Past Surgical History:  Procedure Laterality Date   ANTERIOR CERVICAL DECOMP/DISCECTOMY FUSION  13,15   C3 ,4 ,5 [place]   ANTERIOR FUSION CERVICAL SPINE  2013   [place]  c5-c6   AORTIC VALVE REPLACEMENT N/A 09/24/2022   Procedure: AORTIC VALVE REPLACEMENT (AVR) USING EDWARDS INSPIRIS RESILIA 23 MM AORTIC VALVE;  Surgeon: Alleen Borne, MD;  Location: [facility] OR;  Service: Open Heart Surgery;  Laterality: N/A;  Median sternotomy   CHOLECYSTECTOMY  1989   dental implant     MENISCUS REPAIR Right 01/2010   [place]   RIGHT/LEFT HEART CATH AND CORONARY ANGIOGRAPHY N/A 09/01/2022   Procedure: RIGHT/LEFT HEART CATH AND CORONARY ANGIOGRAPHY;  Surgeon: Tonny Bollman, MD;  Location: [facility] INVASIVE CV LAB;  Service: Cardiovascular;  Laterality: N/A;   SPINE SURGERY     [place], C3, 4 5   TEE WITHOUT CARDIOVERSION N/A 09/24/2022   Procedure: TRANSESOPHAGEAL ECHOCARDIOGRAM (TEE);  Surgeon: Alleen Borne, MD;  Location: [facility];  Service: Open Heart Surgery;  Laterality: N/A;   TOTAL ABDOMINAL HYSTERECTOMY     secondary to fibroids still has ovaries   TOTAL HIP ARTHROPLASTY Left 08/31/2021   Procedure: TOTAL HIP ARTHROPLASTY;  Surgeon: Donato Heinz, MD;  Location: [facility] ORS;  Service: Orthopedics;  Laterality: Left;   Family History  Problem Relation Age of Onset   Thyroid nodules Mother    Alzheimer's disease Mother    Hyperlipidemia Father    Breast cancer Paternal Aunt    Thyroid nodules Sister    Cancer Sister 71       in situ   Breast cancer Sister 49   Thyroid nodules Maternal Grandmother    Social History   Socioeconomic History   Marital status:  Married    Spouse name: Not on file   Number of children: Not on file   Years of education: Not on file   Highest education level: Associate degree: occupational, Scientist, product/process development, or vocational program  Occupational History   Occupation: Armed forces operational officer  Tobacco Use   Smoking status: Never   Smokeless tobacco: Never  Vaping Use   Vaping status: Never Used  Substance and Sexual Activity   Alcohol use: Yes    Alcohol/week: 5.0 standard drinks of alcohol    Types: 5 Standard drinks or equivalent per week    Comment: Occasional   Drug use: No   Sexual activity: Not on file  Other Topics Concern   Not on file  Social History Narrative   Lives with spouse, son graduated from [employer].   Social Determinants of Health   Financial  Resource Strain: Low Risk  (06/20/2023)   Overall Financial Resource Strain (CARDIA)    Difficulty of Paying Living Expenses: Not hard at all  Food Insecurity: No Food Insecurity (06/20/2023)   Hunger Vital Sign    Worried About Running Out of Food in the Last Year: Never true    Ran Out of Food in the Last Year: Never true  Transportation Needs: No Transportation Needs (06/20/2023)   PRAPARE - Administrator, Civil Service (Medical): No    Lack of Transportation (Non-Medical): No  Physical Activity: Sufficiently Active (06/20/2023)   Exercise Vital Sign    Days of Exercise per Week: 6 days    Minutes of Exercise per Session: 30 min  Stress: No Stress Concern Present (06/20/2023)   [employer] of Occupational Health - Occupational Stress Questionnaire    Feeling of Stress : Only a little  Social Connections: Moderately Isolated (06/20/2023)   Social Connection and Isolation Panel [NHANES]    Frequency of Communication with Friends and Family: Three times a week    Frequency of Social Gatherings with Friends and Family: Once a week    Attends Religious Services: Never    Database administrator or Organizations: No    Attends Museum/gallery exhibitions officer: Never    Marital Status: Married    Tobacco Counseling Counseling given: Not Answered   Clinical Intake:  Pre-visit preparation completed: Yes  Pain : 0-10 Pain Score: 4  Pain Type: Chronic pain Pain Location: Knee Pain Orientation: Right Pain Descriptors / Indicators: Aching Pain Onset: More than a month ago Pain Frequency: Constant     BMI - recorded: 22.87 Nutritional Status: BMI of 19-24  Normal Nutritional Risks: None Diabetes: No  How often do you need to have someone help you when you read instructions, pamphlets, or other written materials from your doctor or pharmacy?: 1 - Never  Interpreter Needed?: No  Information entered by :: R. Mang Hazelrigg LPN   Activities of Daily Living    06/20/2023    3:00 PM 09/28/2022   12:00 AM  In your present state of health, do you have any difficulty performing the following activities:  Hearing? 0 0  Vision? 0 0  Difficulty concentrating or making decisions? 0 0  Walking or climbing stairs? 0 0  Dressing or bathing? 0 0  Doing errands, shopping? 0 0  Preparing Food and eating ? N   Using the Toilet? N   In the past six months, have you accidently leaked urine? N   Do you have problems with loss of bowel control? N   Managing your Medications? N   Managing your Finances? N   Housekeeping or managing your Housekeeping? N     Patient Care Team: Sherlene Shams, MD as PCP - General (Internal Medicine) Pricilla Riffle, MD as PCP - Cardiology (Cardiology)  Indicate any recent Medical Services you may have received from other than [facility] providers in the past year (date may be approximate).     Assessment:   This is a routine wellness examination for Carisa.  Hearing/Vision screen Hearing Screening - Comments:: No issues Vision Screening - Comments:: glasses   Goals Addressed             This Visit's Progress    Patient Stated       Wants to have knee surgery so that she can walk and exercise  more, reduce alcohol intake       Depression Screen  06/24/2023    8:32 AM 01/24/2023   10:13 AM 07/26/2022    2:05 PM 06/21/2022    3:35 PM 05/19/2022    3:37 PM 01/07/2022    9:26 AM 05/21/2021    9:07 AM  PHQ 2/9 Scores  PHQ - 2 Score 0 0 0 0 0 0 0  PHQ- 9 Score 2          Fall Risk    06/20/2023    3:00 PM 01/24/2023   10:13 AM 07/26/2022    2:05 PM 06/21/2022    3:34 PM 05/19/2022    3:37 PM  Fall Risk   Falls in the past year? 1 1 1  -- 1  Comment    None since last reported   Number falls in past yr: 0 0 0  0  Injury with Fall? 1 1 1  1   Comment injured left knee      Risk for fall due to : History of fall(s);Impaired balance/gait No Fall Risks History of fall(s)  History of fall(s)  Follow up Falls evaluation completed;Falls prevention discussed Falls evaluation completed Falls evaluation completed  Falls evaluation completed    MEDICARE RISK AT HOME: Medicare Risk at Home Any stairs in or around the home?: Yes If so, are there any without handrails?: No Home free of loose throw rugs in walkways, pet beds, electrical cords, etc?: No Adequate lighting in your home to reduce risk of falls?: Yes Life alert?: No Use of a cane, walker or w/c?: No Grab bars in the bathroom?: No Shower chair or bench in shower?: No Elevated toilet seat or a handicapped toilet?: No   Cognitive Function:        06/24/2023    8:39 AM 06/21/2022    3:43 PM  6CIT Screen  What Year? 0 points   What month? 0 points   What time? 0 points   Count back from 20 0 points   Months in reverse 0 points 0 points  Repeat phrase 0 points   Total Score 0 points     Immunizations Immunization History  Administered Date(s) Administered   Influenza-Unspecified 05/31/2017, 06/20/2018, 04/26/2019   MMR 04/13/2018, 05/11/2018   PFIZER(Purple Top)SARS-COV-2 Vaccination 12/14/2019, 01/09/2020, 07/03/2020, 01/27/2021   Tdap 09/17/2011, 10/13/2021   Zoster Recombinant(Shingrix) 03/23/2018,  05/25/2018    TDAP status: Up to date  Flu Vaccine status: Due, Education has been provided regarding the importance of this vaccine. Advised may receive this vaccine at local pharmacy or Health Dept. Aware to provide a copy of the vaccination record if obtained from local pharmacy or Health Dept. Verbalized acceptance and understanding.  Pneumococcal vaccine status: Due, Education has been provided regarding the importance of this vaccine. Advised may receive this vaccine at local pharmacy or Health Dept. Aware to provide a copy of the vaccination record if obtained from local pharmacy or Health Dept. Verbalized acceptance and understanding.  Covid-19 vaccine status: Information provided on how to obtain vaccines.   Qualifies for Shingles Vaccine? Yes   Zostavax completed No   Shingrix Completed?: Yes  Screening Tests Health Maintenance  Topic Date Due   Pneumonia Vaccine 66+ Years old (1 of 1 - PCV) Never done   INFLUENZA VACCINE  04/14/2023   COVID-19 Vaccine (5 - 2023-24 season) 05/15/2023   Medicare Annual Wellness (AWV)  06/22/2023   MAMMOGRAM  08/19/2023   Colonoscopy  04/30/2027   DTaP/Tdap/Td (3 - Td or Tdap) 10/14/2031   DEXA SCAN  Completed   Hepatitis  C Screening  Completed   HIV Screening  Completed   Zoster Vaccines- Shingrix  Completed   HPV VACCINES  Aged Out    Health Maintenance  Health Maintenance Due  Topic Date Due   Pneumonia Vaccine 71+ Years old (1 of 1 - PCV) Never done   INFLUENZA VACCINE  04/14/2023   COVID-19 Vaccine (5 - 2023-24 season) 05/15/2023   Medicare Annual Wellness (AWV)  06/22/2023    Colorectal cancer screening: Type of screening: Colonoscopy. Completed 04/2017. Repeat every 10 years  Mammogram status: Completed 08/2022. Repeat every year Appointment scheduled  Bone Density status: Completed 12/022. Results reflect: Bone density results: OSTEOPENIA. Repeat every 2 years. Appointment scheduled  Lung Cancer Screening: (Low Dose CT  Chest recommended if Age 62-80 years, 20 pack-year currently smoking OR have quit w/in 15years.) does not qualify.     Additional Screening:  Hepatitis C Screening: does qualify; Completed 01/2015  Vision Screening: Recommended annual ophthalmology exams for early detection of glaucoma and other disorders of the eye. Is the patient up to date with their annual eye exam?  Yes  Who is the provider or what is the name of the office in which the patient attends annual eye exams? [facility] If pt is not established with a provider, would they like to be referred to a provider to establish care? No .   Dental Screening: Recommended annual dental exams for proper oral hygiene    Community Resource Referral / Chronic Care Management: CRR required this visit?  No   CCM required this visit?  No     Plan:     I have personally reviewed and noted the following in the patient's chart:   Medical and social history Use of alcohol, tobacco or illicit drugs  Current medications and supplements including opioid prescriptions. Patient is not currently taking opioid prescriptions. Functional ability and status Nutritional status Physical activity Advanced directives List of other physicians Hospitalizations, surgeries, and ER visits in previous 12 months Vitals Screenings to include cognitive, depression, and falls Referrals and appointments  In addition, I have reviewed and discussed with patient certain preventive protocols, quality metrics, and best practice recommendations. A written personalized care plan for preventive services as well as general preventive health recommendations were provided to patient.     Sydell Axon, LPN   [id number]/06/[id number]   After Visit Summary: (MyChart) Due to this being a telephonic visit, the after visit summary with patients personalized plan was offered to patient via MyChart   Nurse Notes: None

## 2023-07-13 ENCOUNTER — Other Ambulatory Visit: Payer: Self-pay | Admitting: Internal Medicine

## 2023-07-15 ENCOUNTER — Other Ambulatory Visit: Payer: Self-pay | Admitting: Internal Medicine

## 2023-07-15 DIAGNOSIS — M792 Neuralgia and neuritis, unspecified: Secondary | ICD-10-CM

## 2023-07-15 DIAGNOSIS — B029 Zoster without complications: Secondary | ICD-10-CM

## 2023-07-22 ENCOUNTER — Encounter: Payer: Self-pay | Admitting: Internal Medicine

## 2023-07-24 ENCOUNTER — Other Ambulatory Visit: Payer: Self-pay | Admitting: Internal Medicine

## 2023-07-24 MED ORDER — LEVOTHYROXINE SODIUM 100 MCG PO TABS: 100.0000 ug | ORAL_TABLET | Freq: Every day | ORAL | 0 refills | Status: DC

## 2023-07-25 ENCOUNTER — Other Ambulatory Visit (INDEPENDENT_AMBULATORY_CARE_PROVIDER_SITE_OTHER): Payer: Medicare Other

## 2023-07-25 DIAGNOSIS — E785 Hyperlipidemia, unspecified: Secondary | ICD-10-CM | POA: Diagnosis not present

## 2023-07-25 DIAGNOSIS — E034 Atrophy of thyroid (acquired): Secondary | ICD-10-CM

## 2023-07-26 LAB — COMPREHENSIVE METABOLIC PANEL
AG Ratio: 1.9 (calc) (ref 1.0–2.5)
ALT: 17 U/L (ref 6–29)
AST: 21 U/L (ref 10–35)
Albumin: 4.2 g/dL (ref 3.6–5.1)
Alkaline phosphatase (APISO): 73 U/L (ref 37–153)
BUN: 13 mg/dL (ref 7–25)
CO2: 28 mmol/L (ref 20–32)
Calcium: 9.1 mg/dL (ref 8.6–10.4)
Chloride: 103 mmol/L (ref 98–110)
Creat: 0.8 mg/dL (ref 0.50–1.05)
Globulin: 2.2 g/dL (ref 1.9–3.7)
Glucose, Bld: 80 mg/dL (ref 65–99)
Potassium: 5.2 mmol/L (ref 3.5–5.3)
Sodium: 140 mmol/L (ref 135–146)
Total Bilirubin: 0.7 mg/dL (ref 0.2–1.2)
Total Protein: 6.4 g/dL (ref 6.1–8.1)

## 2023-07-26 LAB — LIPID PANEL
Cholesterol: 233 mg/dL — ABNORMAL HIGH (ref <200)
HDL: 90 mg/dL (ref >=50)
LDL Cholesterol (Calc): 123 mg/dL — ABNORMAL HIGH
Non-HDL Cholesterol (Calc): 143 mg/dL — ABNORMAL HIGH (ref <130)
Total CHOL/HDL Ratio: 2.6 (calc) (ref <5.0)
Triglycerides: 101 mg/dL (ref <150)

## 2023-07-26 LAB — TSH: TSH: 2.79 m[IU]/L (ref 0.40–4.50)

## 2023-07-27 ENCOUNTER — Encounter: Payer: Self-pay | Admitting: Internal Medicine

## 2023-07-27 ENCOUNTER — Ambulatory Visit (INDEPENDENT_AMBULATORY_CARE_PROVIDER_SITE_OTHER): Payer: Medicare Other | Admitting: Internal Medicine

## 2023-07-27 VITALS — BP 120/80 | HR 75 | Ht 67.0 in | Wt 151.0 lb

## 2023-07-27 DIAGNOSIS — R030 Elevated blood-pressure reading, without diagnosis of hypertension: Secondary | ICD-10-CM

## 2023-07-27 DIAGNOSIS — E559 Vitamin D deficiency, unspecified: Secondary | ICD-10-CM

## 2023-07-27 DIAGNOSIS — E039 Hypothyroidism, unspecified: Secondary | ICD-10-CM

## 2023-07-27 DIAGNOSIS — M792 Neuralgia and neuritis, unspecified: Secondary | ICD-10-CM | POA: Diagnosis not present

## 2023-07-27 DIAGNOSIS — B029 Zoster without complications: Secondary | ICD-10-CM | POA: Diagnosis not present

## 2023-07-27 DIAGNOSIS — F419 Anxiety disorder, unspecified: Secondary | ICD-10-CM

## 2023-07-27 DIAGNOSIS — F5105 Insomnia due to other mental disorder: Secondary | ICD-10-CM

## 2023-07-27 MED ORDER — PNEUMOCOCCAL 20-VAL CONJ VACC 0.5 ML IM SUSY: 0.5000 mL | PREFILLED_SYRINGE | Freq: Once | INTRAMUSCULAR | 0 refills | Status: AC

## 2023-07-27 NOTE — Progress Notes (Unsigned)
Patient ID: Theresa [name], female    DOB: 09-Mar-1956  Age: 67 y.o. MRN: [id number]  The patient is here for annual preventive examination and management of other chronic and acute problems.   The risk factors are reflected in the social history.   The roster of all physicians providing medical care to patient - is listed in the Snapshot section of the chart.   Activities of daily living:  The patient is 100% independent in all ADLs: dressing, toileting, feeding as well as independent mobility   Home safety : The patient has smoke detectors in the home. They wear seatbelts.  There are no unsecured firearms at home. There is no violence in the home.    There is no risks for hepatitis, STDs or HIV. There is no   history of blood transfusion. They have no travel history to infectious disease endemic areas of the world.   The patient has seen their dentist in the last six month. They have seen their eye doctor in the last year. The patinet  denies slight hearing difficulty with regard to whispered voices and some television programs.  They have deferred audiologic testing in the last year.  They do not  have excessive sun exposure. Discussed the need for sun protection: hats, long sleeves and use of sunscreen if there is significant sun exposure.    Diet: the importance of a healthy diet is discussed. They do have a healthy diet.   The benefits of regular aerobic exercise were discussed. The patient  exercises  3 to 5 days per week  for  60 minutes.    Depression screen: there are no signs or vegative symptoms of depression- irritability, change in appetite, anhedonia, sadness/tearfullness.   The following portions of the patient's history were reviewed and updated as appropriate: allergies, current medications, past family history, past medical history,  past surgical history, past social history  and problem list.   Visual acuity was not assessed per patient preference since the patient has  regular follow up with an  ophthalmologist. Hearing and body mass index were assessed and reviewed.    During the course of the visit the patient was educated and counseled about appropriate screening and preventive services including : fall prevention , diabetes screening, nutrition counseling, colorectal cancer screening, and recommended immunizations.    Chief Complaint:  Rough summer:  fractured left lateral patella in May  Her beloved dog of 13 years had to be euthanized recently.      Review of Symptoms  Patient denies headache, fevers, malaise, unintentional weight loss, skin rash, eye pain, sinus congestion and sinus pain, sore throat, dysphagia,  hemoptysis , cough, dyspnea, wheezing, chest pain, palpitations, orthopnea, edema, abdominal pain, nausea, melena, diarrhea, constipation, flank pain, dysuria, hematuria, urinary  Frequency, nocturia, numbness, tingling, seizures,  Focal weakness, Loss of consciousness,  Tremor, insomnia, depression, anxiety, and suicidal ideation.    Physical Exam:  BP 120/80   Pulse 75   Ht 5\' 7"  (1.702 m)   Wt 151 lb (68.5 kg)   SpO2 98%   BMI 23.65 kg/m    Physical Exam Vitals reviewed.  Constitutional:      General: She is not in acute distress.    Appearance: Normal appearance. She is well-developed and normal weight. She is not ill-appearing, toxic-appearing or diaphoretic.  HENT:     Head: Normocephalic.     Right Ear: Tympanic membrane, ear canal and external ear normal. There is no impacted cerumen.  Left Ear: Tympanic membrane, ear canal and external ear normal. There is no impacted cerumen.     Nose: Nose normal.     Mouth/Throat:     Mouth: Mucous membranes are moist.     Pharynx: Oropharynx is clear.  Eyes:     General: No scleral icterus.       Right eye: No discharge.        Left eye: No discharge.     Conjunctiva/sclera: Conjunctivae normal.     Pupils: Pupils are equal, round, and reactive to light.  Neck:      Thyroid: No thyromegaly.     Vascular: No carotid bruit or JVD.  Cardiovascular:     Rate and Rhythm: Normal rate and regular rhythm.     Heart sounds: Normal heart sounds.  Pulmonary:     Effort: Pulmonary effort is normal. No respiratory distress.     Breath sounds: Normal breath sounds.  Chest:  Breasts:    Breasts are symmetrical.     Right: Normal. No swelling, inverted nipple, mass, nipple discharge, skin change or tenderness.     Left: Normal. No swelling, inverted nipple, mass, nipple discharge, skin change or tenderness.  Abdominal:     General: Bowel sounds are normal.     Palpations: Abdomen is soft. There is no mass.     Tenderness: There is no abdominal tenderness. There is no guarding or rebound.  Musculoskeletal:        General: Normal range of motion.     Cervical back: Normal range of motion and neck supple.  Lymphadenopathy:     Cervical: No cervical adenopathy.     Upper Body:     Right upper body: No supraclavicular, axillary or pectoral adenopathy.     Left upper body: No supraclavicular, axillary or pectoral adenopathy.  Skin:    General: Skin is warm and dry.  Neurological:     General: No focal deficit present.     Mental Status: She is alert and oriented to person, place, and time. Mental status is at baseline.  Psychiatric:        Mood and Affect: Mood normal.        Behavior: Behavior normal.        Thought Content: Thought content normal.        Judgment: Judgment normal.    Assessment and Plan: Vitamin D deficiency -     VITAMIN D 25 Hydroxy (Vit-D Deficiency, Fractures); Future  Nerve pain  Herpes zoster without complication  Acquired hypothyroidism Assessment & Plan: Thyroid function is normal on current dose of levothyroxine, but manufacturer has changed.   Repeat level in 6 weeks  Lab Results  Component Value Date   TSH 2.79 07/25/2023     Orders: -     TSH; Future  Insomnia secondary to anxiety Assessment & Plan: Advised to  try natural product called Relaxium ..encouarged to reduce wine to one glass nightly   Elevated blood-pressure reading, without diagnosis of hypertension Assessment & Plan: She has no history of hypertension but has had several elevated readings.  She has been asked to check her pressures at home and submit readings for evaluation. Renal function will be checked today    Other orders -     Pneumococcal 20-Val Conj Vacc; Inject 0.5 mLs into the muscle once for 1 dose.  Dispense: 0.5 mL; Refill: 0  A total of 40 minutes was spent with patient more than half of which was spent in  counseling patient on the above mentioned issues , reviewing and explaining recent labs and imaging studies done, and coordination of care.   Return in about 6 months (around 01/24/2024).  Sherlene Shams, MD

## 2023-07-27 NOTE — Patient Instructions (Addendum)
I recommend the Prevnar 20 (pneumonia vaccine)  and the high dose Flu vaccine   CHECK bp AT HOME ONCE Daily FOR 7 DAYS AND SEND ME THE  READINGS   Repeat tsh and VITAMIN D LEVEL IN  6 weeks   You cholesterol is fine,  your risk was calculated at 3 to 6% which is very low

## 2023-07-28 NOTE — Assessment & Plan Note (Signed)
She has no history of hypertension but has had several elevated readings.  She has been asked to check her pressures at home and submit readings for evaluation. Renal function will be checked today 

## 2023-07-28 NOTE — Assessment & Plan Note (Signed)
Advised to try natural product called Relaxium ..encouarged to reduce wine to one glass nightly

## 2023-07-28 NOTE — Assessment & Plan Note (Addendum)
Thyroid function is normal on current dose of levothyroxine, but manufacturer has changed.   Repeat level in 6 weeks  Lab Results  Component Value Date   TSH 2.79 07/25/2023

## 2023-08-15 ENCOUNTER — Other Ambulatory Visit: Payer: Self-pay | Admitting: Internal Medicine

## 2023-08-15 DIAGNOSIS — B029 Zoster without complications: Secondary | ICD-10-CM

## 2023-08-15 DIAGNOSIS — M792 Neuralgia and neuritis, unspecified: Secondary | ICD-10-CM

## 2023-08-23 ENCOUNTER — Ambulatory Visit
Admission: RE | Admit: 2023-08-23 | Discharge: 2023-08-23 | Disposition: A | Discharge status: Home / Self Care | Payer: Medicare Other | Source: Ambulatory Visit | Attending: Internal Medicine | Admitting: Internal Medicine

## 2023-08-23 DIAGNOSIS — Z780 Asymptomatic menopausal state: Secondary | ICD-10-CM | POA: Diagnosis present

## 2023-08-23 DIAGNOSIS — Z1231 Encounter for screening mammogram for malignant neoplasm of breast: Secondary | ICD-10-CM | POA: Diagnosis present

## 2023-08-23 DIAGNOSIS — M810 Age-related osteoporosis without current pathological fracture: Secondary | ICD-10-CM

## 2023-08-23 DIAGNOSIS — M8580 Other specified disorders of bone density and structure, unspecified site: Secondary | ICD-10-CM | POA: Insufficient documentation

## 2023-08-24 ENCOUNTER — Encounter: Payer: Self-pay | Admitting: Internal Medicine

## 2023-09-08 ENCOUNTER — Other Ambulatory Visit: Payer: Medicare Other

## 2023-09-08 DIAGNOSIS — E039 Hypothyroidism, unspecified: Secondary | ICD-10-CM

## 2023-09-08 DIAGNOSIS — E559 Vitamin D deficiency, unspecified: Secondary | ICD-10-CM

## 2023-09-09 ENCOUNTER — Encounter: Payer: Self-pay | Admitting: Internal Medicine

## 2023-09-09 LAB — TSH: TSH: 2.87 m[IU]/L (ref 0.40–4.50)

## 2023-09-09 LAB — VITAMIN D 25 HYDROXY (VIT D DEFICIENCY, FRACTURES): Vit D, 25-Hydroxy: 46 ng/mL (ref 30–100)

## 2023-09-11 IMAGING — MG MM DIGITAL SCREENING BILAT W/ TOMO AND CAD
8 series · 9 of 24 positions shown · non-contrast
Comparison: Previous exam(s).

CLINICAL DATA: Screening.

EXAM:
DIGITAL SCREENING BILATERAL MAMMOGRAM WITH TOMOSYNTHESIS AND CAD
TECHNIQUE: Bilateral screening digital craniocaudal and mediolateral oblique
mammograms were obtained. Bilateral screening digital breast
tomosynthesis was performed. The images were evaluated with
computer-aided detection.

[L MLO synth-2D]
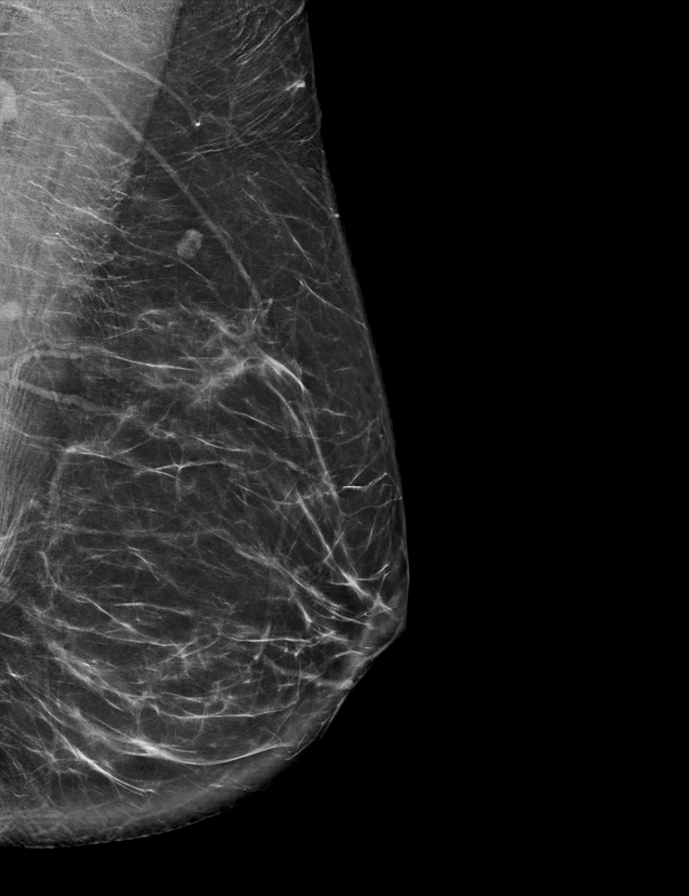

[R CC synth-2D]
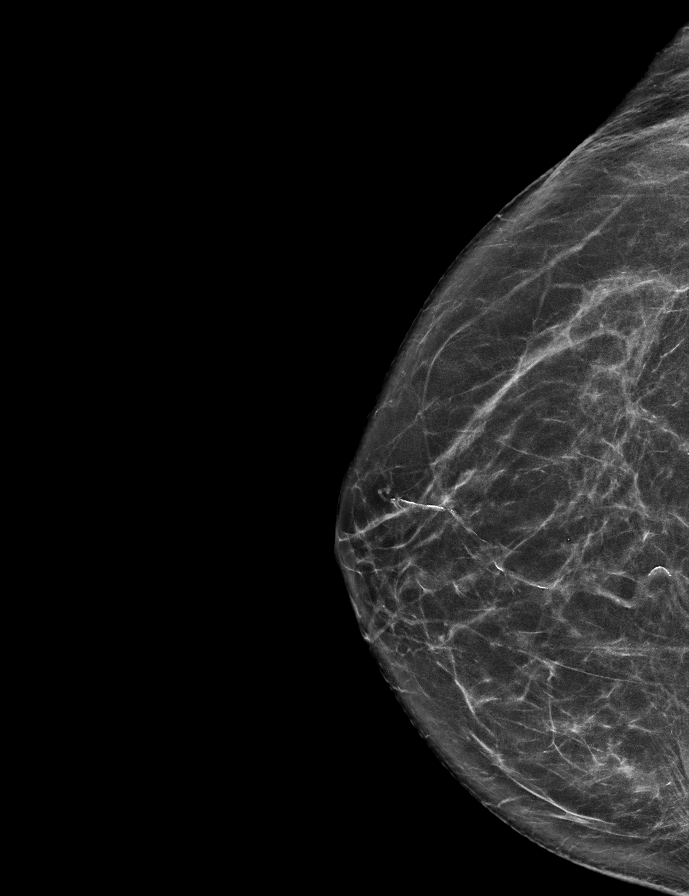

[L CC synth-2D]
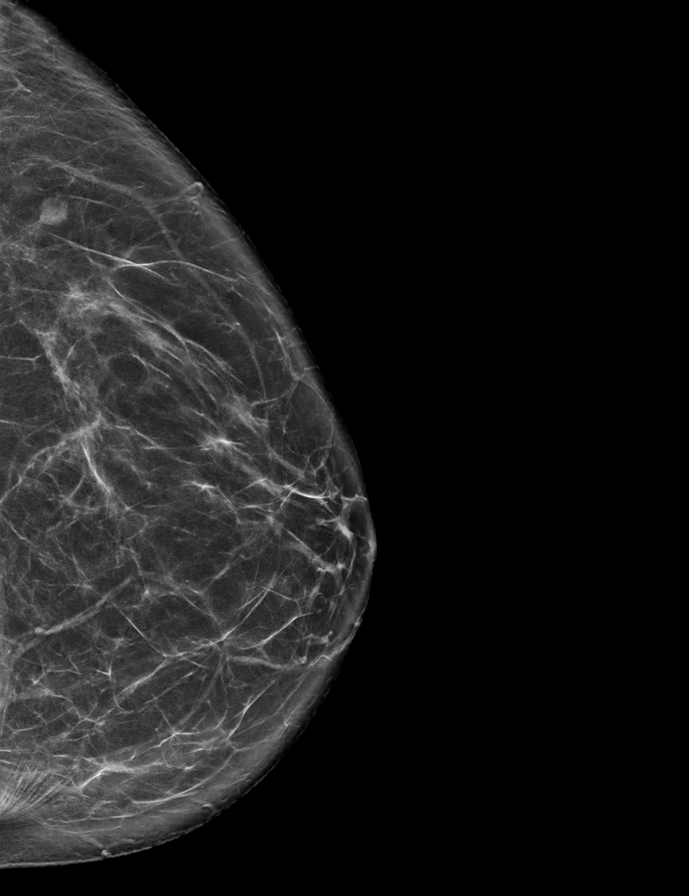

[R MLO synth-2D]
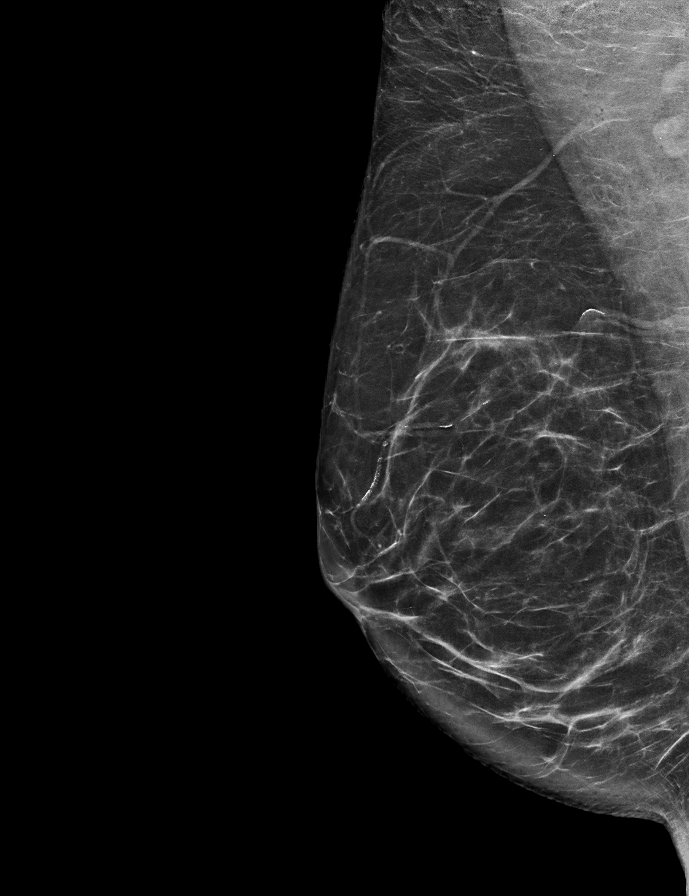

[L CC tomo · 2 of 76 frames shown]
[frame 25/76]
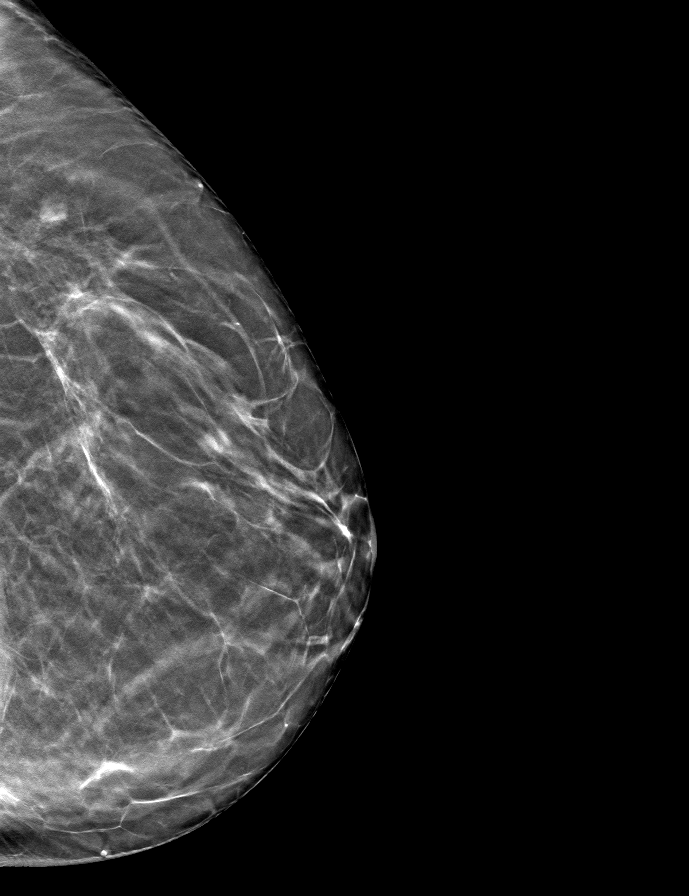
[frame 39/76]
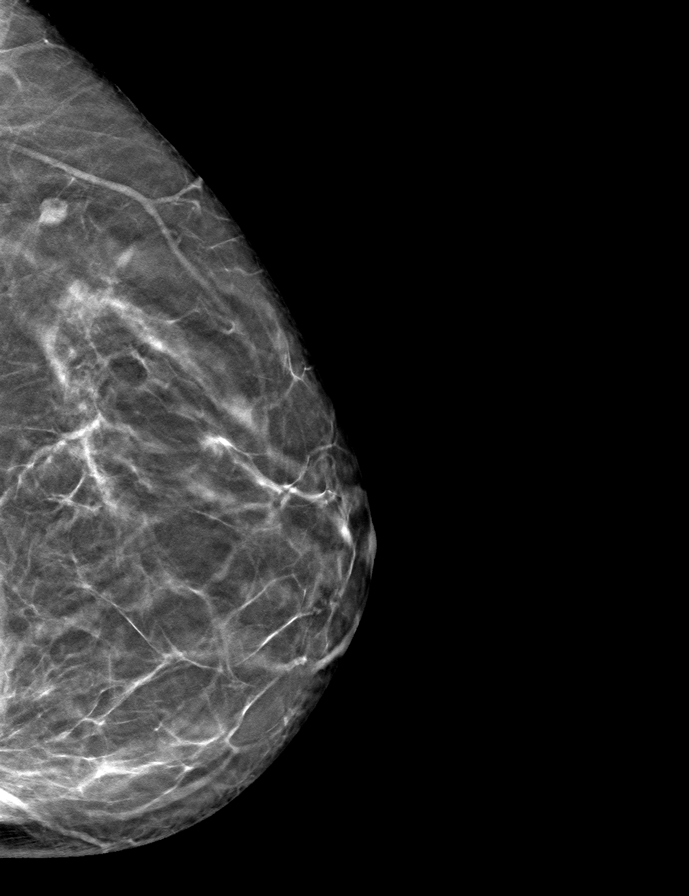

[L MLO tomo · tomo slice 40/79.0]
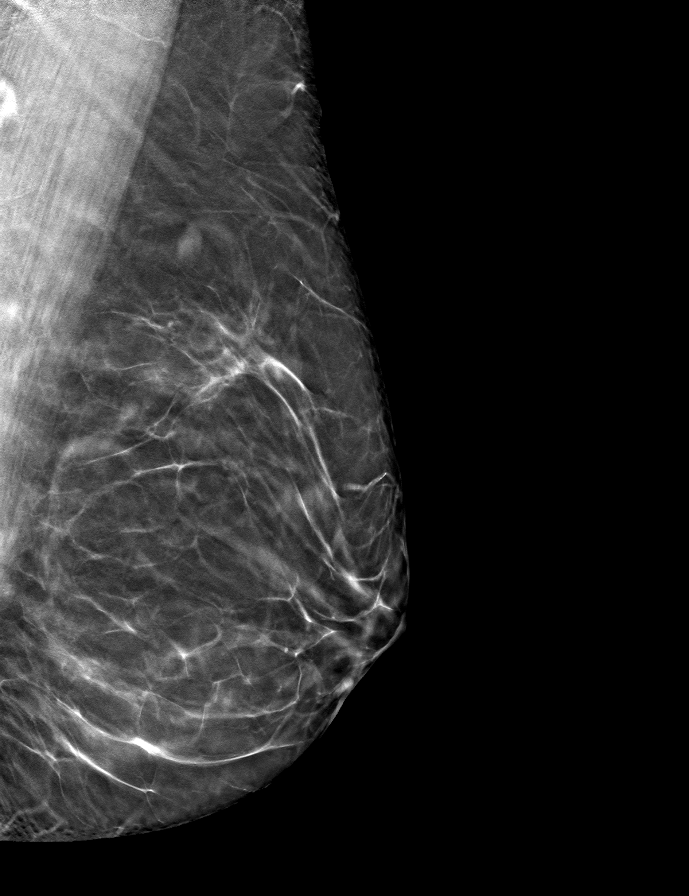

[R CC tomo · tomo slice 34/67.0]
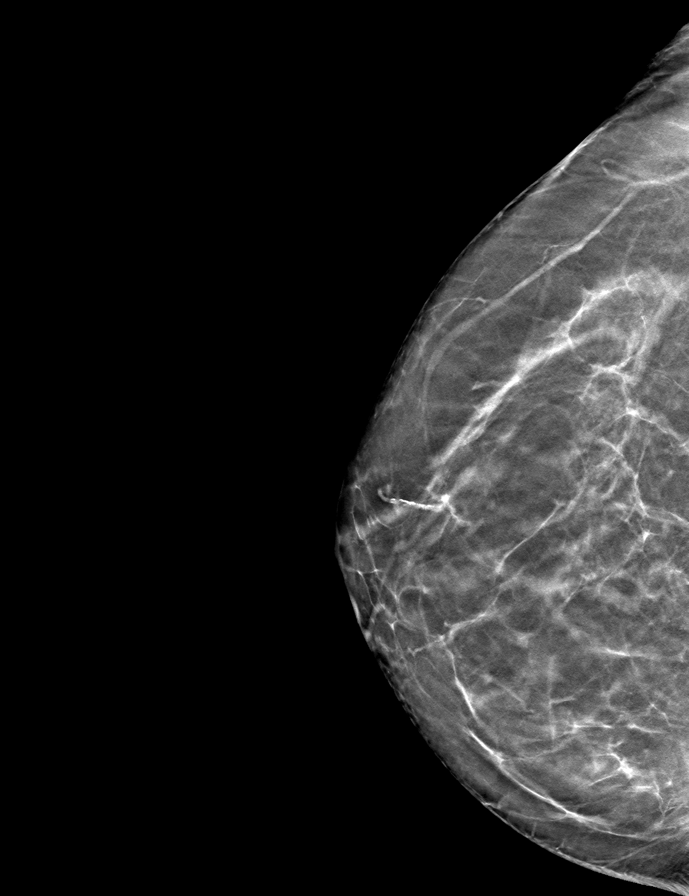

[R MLO tomo · tomo slice 40/79.0]
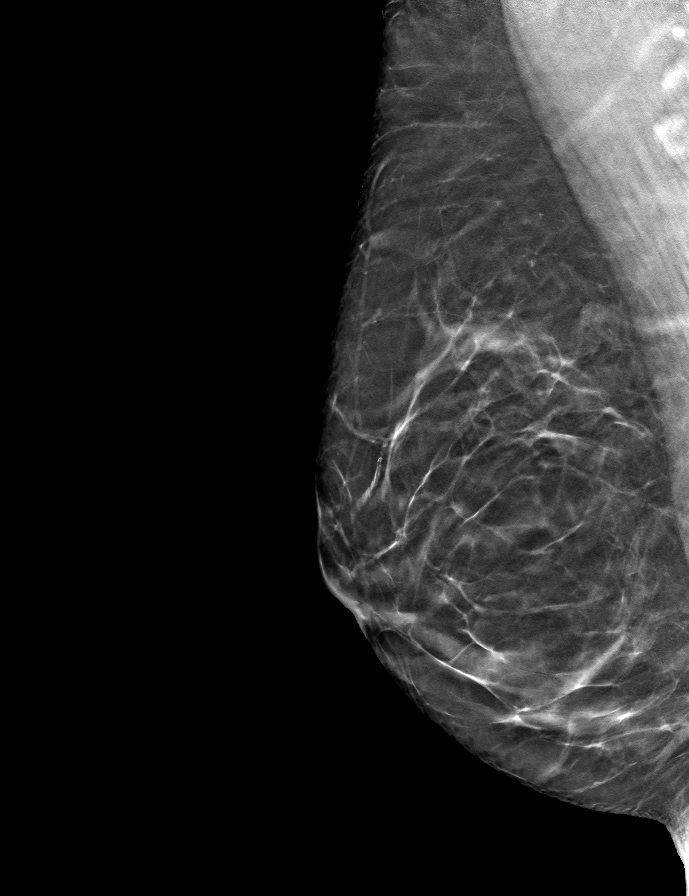

[9 of 24 positions shown; findings below may reference images not displayed]

ACR Breast Density Category b: There are scattered areas of
fibroglandular density.
FINDINGS: There are no findings suspicious for malignancy.
IMPRESSION: No mammographic evidence of malignancy. A result letter of this
screening mammogram will be mailed directly to the patient.

RECOMMENDATION:
Screening mammogram in one year. (Code:51-O-LD2)

BI-RADS CATEGORY  1: Negative.

## 2023-09-16 ENCOUNTER — Encounter: Payer: Self-pay | Admitting: Internal Medicine

## 2023-09-16 ENCOUNTER — Ambulatory Visit (INDEPENDENT_AMBULATORY_CARE_PROVIDER_SITE_OTHER): Payer: Medicare Other | Admitting: Internal Medicine

## 2023-09-16 VITALS — BP 130/66 | HR 79 | Ht 67.0 in | Wt 148.4 lb

## 2023-09-16 DIAGNOSIS — M810 Age-related osteoporosis without current pathological fracture: Secondary | ICD-10-CM

## 2023-09-16 DIAGNOSIS — I10 Essential (primary) hypertension: Secondary | ICD-10-CM

## 2023-09-16 DIAGNOSIS — E034 Atrophy of thyroid (acquired): Secondary | ICD-10-CM | POA: Diagnosis not present

## 2023-09-16 MED ORDER — LEVOTHYROXINE SODIUM 100 MCG PO TABS: 100.0000 ug | ORAL_TABLET | Freq: Every day | ORAL | 1 refills | Status: DC

## 2023-09-16 NOTE — Progress Notes (Signed)
 Subjective:  Patient ID: Theresa [name], female    DOB: 1956-09-06  Age: 68 y.o. MRN: [id number]  CC: The primary encounter diagnosis was Essential hypertension. Diagnoses of Hypothyroidism due to acquired atrophy of thyroid  and Osteoporosis of femur without pathological fracture were also pertinent to this visit.   HPI Theresa [name] presents for  Chief Complaint  Patient presents with   Medical Management of Chronic Issues   1) Osteoporosis:   new diagnosis by 2024 DEXA showing bone loss in the  right hip .SABRA  Prior trial of alendronate caused esophagitis  and  use of  Evista  potential contraindicated due to heart issues and history  of atrial fibrillation. Sister takes Prolia    2) elevated bp  reviewed  home readings which have all been > 130/80, some with diastolic elevations to 99. Not using NSAIDS   Outpatient Medications Prior to Visit  Medication Sig Dispense Refill   ALPRAZolam  (XANAX  XR) 0.5 MG 24 hr tablet TAKE 1 TABLET BY MOUTH AT BEDTIME AS NEEDED FOR ANXIETY 30 tablet 5   aspirin  EC 325 MG tablet Take 1 tablet (325 mg total) by mouth daily.     carboxymethylcellulose (REFRESH TEARS) 0.5 % SOLN Place 1 drop into both eyes 4 (four) times daily.     cetirizine (ZYRTEC) 10 MG tablet Take 10 mg by mouth in the morning.     Cholecalciferol  (VITAMIN D3) 1000 units CAPS Take 1,000 Units by mouth at bedtime.     gabapentin  (NEURONTIN ) 100 MG capsule TAKE 2 CAPSULES BY MOUTH THREE TIMES DAILY 180 capsule 2   Magnesium  250 MG TABS Take 250 mg by mouth at bedtime.     Multiple Vitamin (MULTIVITAMIN WITH MINERALS) TABS tablet Take 1 tablet by mouth every evening.     Probiotic Product (PROBIOTIC PEARLS WOMENS PO) Take 1 capsule by mouth daily after breakfast.     amoxicillin  (AMOXIL ) 500 MG tablet Take 4 tablets (2 grams/ 2000 mg total) by mouth one hour prior to dental procedure. (Patient not taking: Reported on 09/16/2023) 4 tablet 6   levothyroxine  (SYNTHROID ) 100 MCG tablet  Take 1 tablet (100 mcg total) by mouth daily. 90 tablet 0   No facility-administered medications prior to visit.    Review of Systems;  Patient denies headache, fevers, malaise, unintentional weight loss, skin rash, eye pain, sinus congestion and sinus pain, sore throat, dysphagia,  hemoptysis , cough, dyspnea, wheezing, chest pain, palpitations, orthopnea, edema, abdominal pain, nausea, melena, diarrhea, constipation, flank pain, dysuria, hematuria, urinary  Frequency, nocturia, numbness, tingling, seizures,  Focal weakness, Loss of consciousness,  Tremor, insomnia, depression, anxiety, and suicidal ideation.      Objective:  BP 130/66   Pulse 79   Ht 5' 7 (1.702 m)   Wt 148 lb 6.4 oz (67.3 kg)   SpO2 97%   BMI 23.24 kg/m   BP Readings from Last 3 Encounters:  09/16/23 130/66  07/27/23 120/80  01/24/23 122/84    Wt Readings from Last 3 Encounters:  09/16/23 148 lb 6.4 oz (67.3 kg)  07/27/23 151 lb (68.5 kg)  06/24/23 146 lb (66.2 kg)    Physical Exam Vitals reviewed.  Constitutional:      General: She is not in acute distress.    Appearance: Normal appearance. She is normal weight. She is not ill-appearing, toxic-appearing or diaphoretic.  HENT:     Head: Normocephalic.  Eyes:     General: No scleral icterus.       Right  eye: No discharge.        Left eye: No discharge.     Conjunctiva/sclera: Conjunctivae normal.  Cardiovascular:     Rate and Rhythm: Normal rate and regular rhythm.     Heart sounds: Normal heart sounds.  Pulmonary:     Effort: Pulmonary effort is normal. No respiratory distress.     Breath sounds: Normal breath sounds.  Musculoskeletal:        General: Normal range of motion.  Skin:    General: Skin is warm and dry.  Neurological:     General: No focal deficit present.     Mental Status: She is alert and oriented to person, place, and time. Mental status is at baseline.  Psychiatric:        Mood and Affect: Mood normal.        Behavior:  Behavior normal.        Thought Content: Thought content normal.        Judgment: Judgment normal.   Lab Results  Component Value Date   HGBA1C 5.4 09/22/2022   HGBA1C 5.6 07/21/2022    Lab Results  Component Value Date   CREATININE 0.80 07/25/2023   CREATININE 0.99 01/19/2023   CREATININE 0.70 09/27/2022    Lab Results  Component Value Date   WBC 10.1 09/27/2022   HGB 9.5 (L) 09/27/2022   HCT 27.5 (L) 09/27/2022   PLT 155 09/27/2022   GLUCOSE 80 07/25/2023   CHOL 233 (H) 07/25/2023   TRIG 101 07/25/2023   HDL 90 07/25/2023   LDLDIRECT 143 (H) 01/19/2023   LDLCALC 123 (H) 07/25/2023   ALT 17 07/25/2023   AST 21 07/25/2023   NA 140 07/25/2023   K 5.2 07/25/2023   CL 103 07/25/2023   CREATININE 0.80 07/25/2023   BUN 13 07/25/2023   CO2 28 07/25/2023   TSH 2.87 09/08/2023   INR 1.4 (H) 09/24/2022   HGBA1C 5.4 09/22/2022   MICROALBUR <0.2 09/16/2023    MM 3D SCREENING MAMMOGRAM BILATERAL BREAST Result Date: 08/25/2023 CLINICAL DATA:  Screening. EXAM: DIGITAL SCREENING BILATERAL MAMMOGRAM WITH TOMOSYNTHESIS AND CAD TECHNIQUE: Bilateral screening digital craniocaudal and mediolateral oblique mammograms were obtained. Bilateral screening digital breast tomosynthesis was performed. The images were evaluated with computer-aided detection. COMPARISON:  Previous exam(s). ACR Breast Density Category b: There are scattered areas of fibroglandular density. FINDINGS: There are no findings suspicious for malignancy. IMPRESSION: No mammographic evidence of malignancy. A result letter of this screening mammogram will be mailed directly to the patient. RECOMMENDATION: Screening mammogram in one year. (Code:SM-B-01Y) BI-RADS CATEGORY  1: Negative. Electronically Signed   By: Rosaline Collet M.D.   On: 08/25/2023 08:00   DG Bone Density Result Date: 08/23/2023 EXAM: DUAL X-RAY ABSORPTIOMETRY (DXA) FOR BONE MINERAL DENSITY IMPRESSION: Your patient Theresa [name] completed a BMD test on  08/23/2023 using the [employer] iDXA DXA System (software version: 14.10) manufactured by [employer]. The following summarizes the results of our evaluation. Technologist:VLM PATIENT BIOGRAPHICAL: Name: Theresa [name] Patient ID: [id number] Birth Date: 1956-07-03 Height: 67.0 in. Gender: Female Exam Date: 08/23/2023 Weight: 151.0 lbs. Indications: Caucasian, Family Hist. (Parent hip fracture), Family Hx of Osteoporosis, Height Loss, Hip Replacement Left, History of Fracture (Adult), Hypothyroid, Hysterectomy, Osteopenia, Parent Hip Fracture, Postmenopausal Fractures: Tibia Treatments: CALCIUM  VIT D, Gabapentin , LEVOTHYROXINE , Multi-Vitamin, Synthroid , Vitamin D , ZYRTEC DENSITOMETRY RESULTS: Site        Region Measured Date Measured Age WHO Classification Young Adult T-score BMD         %  Change vs. Previous Significant Change (*) AP Spine L1-L2 08/23/2023 65.3 Osteopenia -1.6 0.983 g/cm2 -2.1% - AP Spine L1-L2 08/17/2021 63.3 Osteopenia -1.4 1.004 g/cm2 3.5% - AP Spine L1-L2 09/03/2019 61.4 Osteopenia -1.7 0.970 g/cm2 4.2% - AP Spine L1-L2 08/30/2017 59.4 Osteopenia -2.0 0.931 g/cm2 -1.7% - AP Spine L1-L2 08/04/2015 57.3 Osteopenia -1.9 0.947 g/cm2 2.3% - AP Spine L1-L2 08/02/2013 55.3 Osteopenia -2.0 0.926 g/cm2 -8.9% Yes AP Spine L1-L2 10/07/2010 52.5 Osteopenia -1.3 1.016 g/cm2 -1.1% - AP Spine L1-L2 09/19/2008 50.4 Osteopenia -1.2 1.027 g/cm2 - - Right Femur Total 08/23/2023 65.3 Osteoporosis -2.5 0.699 g/cm2 -5.3% Yes Right Femur Total 08/17/2021 63.3 Osteopenia -2.1 0.738 g/cm2 -2.1% - Right Femur Total 09/03/2019 61.4 Osteopenia -2.0 0.754 g/cm2 3.4% Yes Right Femur Total 08/30/2017 59.4 Osteopenia -2.2 0.729 g/cm2 -5.3% Yes Right Femur Total 08/04/2015 57.3 Osteopenia -1.9 0.770 g/cm2 1.7% - Right Femur Total 08/02/2013 55.3 Osteopenia -2.0 0.757 g/cm2 -10.2% Yes Right Femur Total 10/07/2010 52.5 Osteopenia -1.3 0.843 g/cm2 -2.8% Yes Right Femur Total 09/19/2008 50.4 Osteopenia -1.1 0.867 g/cm2 - -  ASSESSMENT: The BMD measured at Femur Total is 0.699 g/cm2 with a T-score of -2.5. This patient's diagnostic category is OSTEOPOROSIS according to World Health Organization [facility]) criteria. L-3 & L-4 were excluded due to surgical hardware. Compared with 08/17/2021. Since the prior study, there has been a SIGNIFICANT DECREASE in bone mineral density of the RIGHT hip (-5.3%). Since the prior study, there has been NO SIGNIFICANT CHANGE in bone mineral density of the lumbar spine. The scan quality is good. [employer] [facility]) criteria for post-menopausal, Caucasian Women: Normal:                   T-score at or above -1 SD Osteopenia/low bone mass: T-score between -1 and -2.5 SD Osteoporosis:             T-score at or below -2.5 SD RECOMMENDATIONS: 1. All patients should optimize calcium  and vitamin D  intake. 2. Consider FDA-approved medical therapies in postmenopausal women and men aged 39 years and older, based on the following: a. A hip or vertebral(clinical or morphometric) fracture b. T-score < -2.5 at the femoral neck or spine after appropriate evaluation to exclude secondary causes c. Low bone mass (T-score between -1.0 and -2.5 at the femoral neck or spine) and a 10-year probability of a hip fracture > 3% or a 10-year probability of a major osteoporosis-related fracture > 20% based on the [place] -adapted WHO algorithm 3. Clinician judgment and/or patient preferences may indicate treatment for people with 10-year fracture probabilities above or below these levels FOLLOW-UP: People with diagnosed cases of osteoporosis or at high risk for fracture should have regular bone mineral density tests. For patients eligible for Medicare, routine testing is allowed once every 2 years. The testing frequency can be increased to one year for patients who have rapidly progressing disease, those who are receiving or discontinuing medical therapy to restore bone mass, or have additional risk factors. I have reviewed this  report, and agree with the above findings. [facility] Electronically Signed   By: Reyes Phi M.D.   On: 08/23/2023 11:55    Assessment & Plan:  .Essential hypertension Assessment & Plan: Starting amlodipine    Lab Results  Component Value Date   MICROALBUR <0.2 09/16/2023   MICROALBUR 0.6 07/21/2022     Lab Results  Component Value Date   CREATININE 0.80 07/25/2023   Lab Results  Component Value Date   NA 140 07/25/2023  K 5.2 07/25/2023   CL 103 07/25/2023   CO2 28 07/25/2023     Orders: -     Microalbumin / creatinine urine ratio  Hypothyroidism due to acquired atrophy of thyroid  -     Levothyroxine  Sodium; Take 1 tablet (100 mcg total) by mouth daily.  Dispense: 90 tablet; Refill: 1  Osteoporosis of femur without pathological fracture Assessment & Plan: Discussed treatment options,  Calcium  and Vit d requirements.  Prolia  advised given her age and contraindications  to alendronate and raloxifene  .     Other orders -     amLODIPine  Besylate; Take 1 tablet (2.5 mg total) by mouth daily.  Dispense: 90 tablet; Refill: 1     I provided 30 minutes of face-to-face time during this encounter reviewing patient's last visit with me, patient's  most recent visit with cardiology,  orthopedics ,  recent surgical and non surgical procedures, previous  labs and imaging studies, counseling on currently addressed issues,  and post visit ordering to diagnostics and therapeutics .   Follow-up: No follow-ups on file.   Verneita LITTIE Kettering, MD

## 2023-09-16 NOTE — Patient Instructions (Signed)
 I do recommend starting medication to lower your blood pressure .  Goal is 120/70 to 130/80  I will send either amlodipine or telmisartan to your pharmacy once the results your urine test is available  Prolia authorization in progress

## 2023-09-17 ENCOUNTER — Encounter: Payer: Self-pay | Admitting: Internal Medicine

## 2023-09-17 LAB — MICROALBUMIN / CREATININE URINE RATIO
Creatinine, Urine: 25 mg/dL (ref 20–275)
Microalb, Ur: <0.2 mg/dL

## 2023-09-17 MED ORDER — AMLODIPINE BESYLATE 2.5 MG PO TABS: 2.5000 mg | ORAL_TABLET | Freq: Every day | ORAL | 1 refills | Status: DC

## 2023-09-17 NOTE — Assessment & Plan Note (Signed)
 Discussed treatment options,  Calcium and Vit d requirements.  Prolia advised given her age and contraindications  to alendronate and raloxifene .

## 2023-09-17 NOTE — Assessment & Plan Note (Signed)
 Starting amlodipine    Lab Results  Component Value Date   MICROALBUR <0.2 09/16/2023   MICROALBUR 0.6 07/21/2022     Lab Results  Component Value Date   CREATININE 0.80 07/25/2023   Lab Results  Component Value Date   NA 140 07/25/2023   K 5.2 07/25/2023   CL 103 07/25/2023   CO2 28 07/25/2023

## 2023-09-20 ENCOUNTER — Other Ambulatory Visit (HOSPITAL_COMMUNITY): Payer: Self-pay

## 2023-09-20 ENCOUNTER — Telehealth: Payer: Self-pay

## 2023-09-20 MED ORDER — DENOSUMAB 60 MG/ML ~~LOC~~ SOSY: 60.0000 mg | PREFILLED_SYRINGE | Freq: Once | SUBCUTANEOUS | Status: DC

## 2023-09-20 NOTE — Telephone Encounter (Signed)
 Prolia VOB initiated via [email or web address]

## 2023-09-20 NOTE — Addendum Note (Signed)
 Addended by: Warden Fillers on: 09/20/2023 10:37 AM   Modules accepted: Orders

## 2023-09-23 ENCOUNTER — Other Ambulatory Visit (HOSPITAL_COMMUNITY): Payer: Self-pay

## 2023-09-23 NOTE — Telephone Encounter (Signed)
 [name]

## 2023-09-23 NOTE — Telephone Encounter (Signed)
 Pt ready for scheduling for PROLIA  on or after : 09/23/23  Out-of-pocket cost due at time of visit: $350 (DEDUCTIBLE)  Number of injection/visits approved: ---  Primary: MEDICARE Prolia  co-insurance: 0% Admin fee co-insurance: 0%  Secondary: GEHA Prolia  co-insurance:  Admin fee co-insurance:   Medical Benefit Details: Date Benefits were checked: 09/22/23 Deductible: $0 Met of $257 Required (MEDICARE), $0 Met of $350 Required (GEHA) / Coinsurance: 0%/ Admin Fee: 0%  Prior Auth: N/A PA# Expiration Date:   # of doses approved:  Pharmacy benefit: Copay $30 If patient wants fill through the pharmacy benefit please send prescription to:  [facility] , and include estimated need by date in rx notes. Pharmacy will ship medication directly to the office.  Patient NOT eligible for Prolia  Copay Card. Copay Card can make patient's cost as little as $25. Link to apply: [email or web address]://[email or web address]  ** This summary of benefits is an estimation of the patient's out-of-pocket cost. Exact cost may very based on individual plan coverage.

## 2023-09-26 ENCOUNTER — Encounter: Payer: Self-pay | Admitting: Internal Medicine

## 2023-10-03 ENCOUNTER — Encounter: Payer: Self-pay | Admitting: Internal Medicine

## 2023-10-03 ENCOUNTER — Ambulatory Visit (HOSPITAL_COMMUNITY): Payer: Medicare Other | Attending: Internal Medicine

## 2023-10-03 DIAGNOSIS — I359 Nonrheumatic aortic valve disorder, unspecified: Secondary | ICD-10-CM | POA: Insufficient documentation

## 2023-10-03 DIAGNOSIS — Z952 Presence of prosthetic heart valve: Secondary | ICD-10-CM | POA: Insufficient documentation

## 2023-10-03 DIAGNOSIS — I351 Nonrheumatic aortic (valve) insufficiency: Secondary | ICD-10-CM | POA: Insufficient documentation

## 2023-10-03 LAB — ECHOCARDIOGRAM COMPLETE
AV Mean grad: 12 mm[Hg]
AV Peak grad: 21.4 mm[Hg]
Ao pk vel: 2.31 m/s
Area-P 1/2: 3.08 cm2
Est EF: 55
S' Lateral: 3.1 cm

## 2023-10-07 NOTE — Progress Notes (Unsigned)
Cardiology Office Note   Date:  10/10/2023   ID:  Theresa [name], DOB [birth year], MRN [id number]  PCP:  Sherlene Shams, MD  Cardiologist:   Dietrich Pates, MD   F/U of AV dz     History of Present Illness: Theresa [name] is a 68 y.o. 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  R/L heart cath showed minimal irregularities to RCA    Underwent AVR on 09/24/22   Post op had afib  She was seen most recently in cardiology on 10/15/22 by Margaretha Glassing    I saw the pt in clinic in April 2024   She just had an echo 10/03/23     The pt says she feels good  [name] CP   Breathing is good   Notes BP is high at times     Current Meds  Medication Sig   ALPRAZolam (XANAX XR) 0.5 MG 24 hr tablet TAKE 1 TABLET BY MOUTH AT BEDTIME AS NEEDED FOR ANXIETY   amLODipine (NORVASC) 2.5 MG tablet Take 1 tablet (2.5 mg total) by mouth daily.   amoxicillin (AMOXIL) 500 MG tablet Take 4 tablets (2 grams/ 2000 mg total) by mouth one hour prior to dental procedure.   aspirin EC 325 MG tablet Take 1 tablet (325 mg total) by mouth daily.   carboxymethylcellulose (REFRESH TEARS) 0.5 % SOLN Place 1 drop into both eyes 4 (four) times daily.   cetirizine (ZYRTEC) 10 MG tablet Take 10 mg by mouth in the morning.   Cholecalciferol (VITAMIN D3) 1000 units CAPS Take 1,000 Units by mouth at bedtime.   gabapentin (NEURONTIN) 100 MG capsule TAKE 2 CAPSULES BY MOUTH THREE TIMES DAILY   levothyroxine (SYNTHROID) 100 MCG tablet Take 1 tablet (100 mcg total) by mouth daily.   Magnesium 250 MG TABS Take 250 mg by mouth at bedtime.   Multiple Vitamin (MULTIVITAMIN WITH MINERALS) TABS tablet Take 1 tablet by mouth every evening.   Probiotic Product (PROBIOTIC PEARLS WOMENS PO) Take 1 capsule by mouth daily after breakfast.   Current Facility-Administered Medications for the 10/10/23 encounter (Office Visit) with Pricilla Riffle, MD  Medication   denosumab  (PROLIA) injection 60 mg     Allergies:   Alendronate, Mobic [meloxicam], Topamax [topiramate], and Tramadol   Past Medical History:  Diagnosis Date   Allergy    Anxiety    Aortic regurgitation 02/17/2018   Noted by cardiology,  moderate to severe by repeat ECHO Nov 2023.     Arthritis    Atrial fibrillation (HCC) 10/04/2022   S/P AVR   Breast screening, unspecified    Cataract    Clotting disorder (HCC) 10/2017   From use of anti-inflammatories   Essential hypertension 01/22/2020   H/O: Bell's palsy    recurrent   Headache    Heart murmur    History of kidney stones    Hypercalcemia    Neuromuscular disorder (HCC) 02/2000   Osteoporosis    Screening for malignant neoplasm of the cervix    Special screening for malignant neoplasms, colon    Unspecified hypothyroidism    thyroid nodule   Unspecified otitis media     Past Surgical History:  Procedure Laterality Date   ANTERIOR CERVICAL DECOMP/DISCECTOMY FUSION  13,15   C3 ,4 ,5 [place]   ANTERIOR FUSION CERVICAL SPINE  2013   [place]  c5-c6  AORTIC VALVE REPLACEMENT N/A 09/24/2022   Procedure: AORTIC VALVE REPLACEMENT (AVR) USING EDWARDS INSPIRIS RESILIA 23 MM AORTIC VALVE;  Surgeon: Alleen Borne, MD;  Location: [facility] OR;  Service: Open Heart Surgery;  Laterality: N/A;  Median sternotomy   CARDIAC VALVE REPLACEMENT  09/24/22   CHOLECYSTECTOMY  1989   dental implant     JOINT REPLACEMENT  08/31/2021   Total L hip   MENISCUS REPAIR Right 01/2010   [place]   RIGHT/LEFT HEART CATH AND CORONARY ANGIOGRAPHY N/A 09/01/2022   Procedure: RIGHT/LEFT HEART CATH AND CORONARY ANGIOGRAPHY;  Surgeon: Tonny Bollman, MD;  Location: [facility] INVASIVE CV LAB;  Service: Cardiovascular;  Laterality: N/A;   SPINE SURGERY     [place], C3, 4 5   TEE WITHOUT CARDIOVERSION N/A 09/24/2022   Procedure: TRANSESOPHAGEAL ECHOCARDIOGRAM (TEE);  Surgeon: Alleen Borne, MD;  Location: [facility];  Service: Open Heart Surgery;  Laterality: N/A;   TOTAL  ABDOMINAL HYSTERECTOMY     secondary to fibroids still has ovaries   TOTAL HIP ARTHROPLASTY Left 08/31/2021   Procedure: TOTAL HIP ARTHROPLASTY;  Surgeon: Donato Heinz, MD;  Location: [facility] ORS;  Service: Orthopedics;  Laterality: Left;     Social History:  The patient  reports that she has never smoked. She has never used smokeless tobacco. She reports current alcohol use of about 1.0 standard drink of alcohol per week. She reports that she does not use drugs.   Family History:  The patient's family history includes Alzheimer's disease in her mother; Arthritis in her sister; Breast cancer in her paternal aunt; Breast cancer (age of onset: 63) in her sister; Cancer in her paternal aunt; Cancer (age of onset: 3) in her sister; Hearing loss in her father; Hyperlipidemia in her father; Thyroid nodules in her maternal grandmother, mother, and sister; Varicose Veins in her father; Vision loss in her father.    ROS:  Please see the history of present illness. All other systems are reviewed and  Negative to the above problem except as noted.    PHYSICAL EXAM: VS:  BP (!) 132/100   Pulse 68   Ht 5\' 7"  (1.702 m)   Wt 148 lb 12.8 oz (67.5 kg)   SpO2 100%   BMI 23.31 kg/m   GEN: Well nourished, well developed, in no acute distress  HEENT: normal  Neck: no JVD, carotid bruit Cardiac: RRR  Gr I/VI systolic murmur LSB and apex  No LE edema  Respiratory:  clear to auscultation bilaterally,  GI: soft, nontender, nondistended,   No hepatomegaly    EKG:  EKG is ordered today.  NSR 65 bpm  LAFB   LVH    Anteroseptal MI   Echo   Jan 2024  1. Left ventricular ejection fraction, by estimation, is 55%. The left  ventricle has normal function. The left ventricle has no regional wall  motion abnormalities but there was septal bounce suggestive of prior  cardiac surgery. Left ventricular  diastolic parameters are consistent with Grade I diastolic dysfunction  (impaired relaxation).   2. Right  ventricular systolic function is normal. The right ventricular  size is normal. There is normal pulmonary artery systolic pressure. The  estimated right ventricular systolic pressure is 20.8 mmHg.   3. Cannot rule out small PFO.   4. The mitral valve is normal in structure. Mild mitral valve  regurgitation. No evidence of mitral stenosis.   5. Bioprosthetic aortic valve. Mean gradient 14 mmHg, no significant  regurgitation.   6.  The inferior vena cava is normal in size with greater than 50%  respiratory variability, suggesting right atrial pressure of 3 mmHg.  Echo  Feb 2024  1. Left ventricular ejection fraction, by estimation, is 55 to 60%. The  left ventricle has normal function. The left ventricle has no regional  wall motion abnormalities. Left ventricular diastolic parameters are  consistent with Grade I diastolic  dysfunction (impaired relaxation).   2. Right ventricular systolic function is normal. The right ventricular  size is normal.   3. Mitral valve prolapse with MR best seen in the 2 chamber color view.  The mitral valve is grossly normal. Mild to moderate mitral valve  regurgitation.   4. The aortic valve has been repaired/replaced. Aortic valve  regurgitation is not visualized. Echo findings are consistent with normal  structure and function of the aortic valve prosthesis.   5. The inferior vena cava is normal in size with greater than 50%  respiratory variability, suggesting right atrial pressure of 3 mmHg.   Echo  Sept 2022  Left ventricular ejection fraction, by estimation, is 65 to 70%. Left ventricular ejection fraction by 3D volume is 70 %. The left ventricle has normal function. The left ventricle has no regional wall motion abnormalities. Left ventricular diastolic parameters are consistent with Grade I diastolic dysfunction (impaired relaxation). 1. Right ventricular systolic function is normal. The right ventricular size is normal. There is normal  pulmonary artery systolic pressure. The estimated right ventricular systolic pressure is 28.2 mmHg. 2. The mitral valve is normal in structure. Mild mitral valve regurgitation. No evidence of mitral stenosis. 3. The aortic valve is abnormal. Quadracuspid aortic valve. Although the PHT is consistent with mild AI, visually there AI appears moderate.Aortic valve regurgitation is mild to moderate. No aortic stenosis is present. Aortic regurgitation PHT measures 487 msec. 4. The inferior vena cava is normal in size with greater than 50% respiratory variability, suggesting right atrial pressure of 3 mmHg. 5. 6. Compared to study 05/08/2020 there is no change.  Lipid Panel    Component Value Date/Time   CHOL 233 (H) 07/25/2023 0729   TRIG 101 07/25/2023 0729   HDL 90 07/25/2023 0729   CHOLHDL 2.6 07/25/2023 0729   VLDL 12 01/28/2017 0900   LDLCALC 123 (H) 07/25/2023 0729   LDLDIRECT 143 (H) 01/19/2023 0736      Wt Readings from Last 3 Encounters:  10/10/23 148 lb 12.8 oz (67.5 kg)  09/16/23 148 lb 6.4 oz (67.3 kg)  07/27/23 151 lb (68.5 kg)      ASSESSMENT AND PLAN:  1  AV dz   Pt with hx quadricuspid AV and AI   S/P AVR Echo shows valve is working well   Follow   Keep on ASA   2  Mitral regurgitation   Mild MR on echo   3   CAD  Minimal irregularlies at time of cath   Follow   Rx lipids   4 Lipids   Will add 5 mg Crestor to get tighter control of lipids given hx surgery and minimal atherosclerotic changes  Follow up lipomed, ApoB and LPa in 8 wks with liver panel   Pt has appt with T Tullo later this spring   Will call if BP high  Current medicines are reviewed at length with the patient today.  The patient does not have concerns regarding medicines.  Signed, Dietrich Pates, MD  10/10/2023 11:09 AM    [facility] [address]  [address], [place], [place]  [address] Phone: [phone]; Fax: [phone]

## 2023-10-10 ENCOUNTER — Ambulatory Visit: Payer: Medicare Other | Attending: Internal Medicine | Admitting: Internal Medicine

## 2023-10-10 ENCOUNTER — Encounter: Payer: Self-pay | Admitting: Internal Medicine

## 2023-10-10 VITALS — BP 132/100 | HR 68 | Ht 67.0 in | Wt 148.8 lb

## 2023-10-10 DIAGNOSIS — Z952 Presence of prosthetic heart valve: Secondary | ICD-10-CM

## 2023-10-10 DIAGNOSIS — I359 Nonrheumatic aortic valve disorder, unspecified: Secondary | ICD-10-CM | POA: Diagnosis not present

## 2023-10-10 DIAGNOSIS — I351 Nonrheumatic aortic (valve) insufficiency: Secondary | ICD-10-CM

## 2023-10-10 DIAGNOSIS — I10 Essential (primary) hypertension: Secondary | ICD-10-CM

## 2023-10-10 DIAGNOSIS — E782 Mixed hyperlipidemia: Secondary | ICD-10-CM | POA: Diagnosis present

## 2023-10-10 DIAGNOSIS — E785 Hyperlipidemia, unspecified: Secondary | ICD-10-CM

## 2023-10-10 MED ORDER — ROSUVASTATIN CALCIUM 5 MG PO TABS: 5.0000 mg | ORAL_TABLET | Freq: Every day | ORAL | 3 refills | Status: DC

## 2023-10-10 MED ORDER — AMLODIPINE BESYLATE 5 MG PO TABS: 5.0000 mg | ORAL_TABLET | Freq: Every day | ORAL | 3 refills | Status: DC

## 2023-10-10 NOTE — Patient Instructions (Signed)
Medication Instructions:  Increase Amlodipine to 5 mg a day  Start Crestor/ Rosuvastatin 5 mg a day  *If you need a refill on your cardiac medications before your next appointment, please call your pharmacy*   Lab Work: NMR, APO B, LIPO A, HEPATIC, CBC IN 8 WEEKS AT ANY [facility]  If you have labs (blood work) drawn today and your tests are completely normal, you will receive your results only by: MyChart Message (if you have MyChart) OR A paper copy in the mail If you have any lab test that is abnormal or we need to change your treatment, we will call you to review the results.   Testing/Procedures:    Follow-Up: At [facility], you and your health needs are our priority.  As part of our continuing mission to provide you with exceptional heart care, we have created designated Provider Care Teams.  These Care Teams include your primary Cardiologist (physician) and Advanced Practice Providers (APPs -  Physician Assistants and Nurse Practitioners) who all work together to provide you with the care you need, when you need it.  We recommend signing up for the patient portal called "MyChart".  Sign up information is provided on this After Visit Summary.  MyChart is used to connect with patients for Virtual Visits (Telemedicine).  Patients are able to view lab/test results, encounter notes, upcoming appointments, etc.  Non-urgent messages can be sent to your provider as well.   To learn more about what you can do with MyChart, go to [email or web address].    Your next appointment:  ONE YEAR

## 2023-10-11 ENCOUNTER — Other Ambulatory Visit: Payer: Self-pay | Admitting: *Deleted

## 2023-10-11 ENCOUNTER — Encounter: Payer: Self-pay | Admitting: Internal Medicine

## 2023-10-11 ENCOUNTER — Other Ambulatory Visit: Payer: Self-pay

## 2023-10-11 ENCOUNTER — Other Ambulatory Visit (HOSPITAL_COMMUNITY): Payer: Self-pay

## 2023-10-11 MED ORDER — DENOSUMAB 60 MG/ML ~~LOC~~ SOSY
60.0000 mg | PREFILLED_SYRINGE | SUBCUTANEOUS | 1 refills | Status: DC
  Filled 2023-10-11 – 2023-10-12 (×2): qty 1, 180d supply, fill #0
  Filled 2024-04-03 – 2024-04-06 (×2): qty 1, 180d supply, fill #1

## 2023-10-11 NOTE — Addendum Note (Signed)
Addended by: Warden Fillers on: 10/11/2023 03:34 PM   Modules accepted: Orders

## 2023-10-12 ENCOUNTER — Other Ambulatory Visit: Payer: Self-pay

## 2023-10-12 NOTE — Progress Notes (Signed)
Specialty Pharmacy Initial Fill Coordination Note  Kriste Basque Sharisse Rantz is a 68 y.o. female contacted today regarding initial fill of specialty medication(s) Denosumab (PROLIA)   Patient requested Courier to Provider Office   Delivery date: 10/17/23   Verified address: [facility] at [address]   Medication will be filled on 1/31.   Patient is aware of $30 copayment.

## 2023-10-12 NOTE — Progress Notes (Signed)
Pharmacy Patient Advocate Encounter  Insurance verification completed.   The patient is insured through [employer] test claim for Prolia. Currently a quantity of 1 ML is a 180 day supply and the co-pay is $30.  This test claim was processed through [facility]- copay amounts may vary at other pharmacies due to pharmacy/plan contracts, or as the patient moves through the different stages of their insurance plan.

## 2023-10-14 ENCOUNTER — Other Ambulatory Visit: Payer: Self-pay

## 2023-10-20 ENCOUNTER — Ambulatory Visit: Payer: Medicare Other

## 2023-10-20 DIAGNOSIS — M810 Age-related osteoporosis without current pathological fracture: Secondary | ICD-10-CM

## 2023-10-20 MED ORDER — DENOSUMAB 60 MG/ML ~~LOC~~ SOSY: 60.0000 mg | PREFILLED_SYRINGE | Freq: Once | SUBCUTANEOUS | Status: DC

## 2023-10-20 MED ORDER — DENOSUMAB 60 MG/ML ~~LOC~~ SOSY
60.0000 mg | PREFILLED_SYRINGE | Freq: Once | SUBCUTANEOUS | Status: AC
  Administered 2023-10-20: 60 mg via SUBCUTANEOUS

## 2023-10-20 NOTE — Progress Notes (Addendum)
 Pt presented today for her first Prolia injection. Left arm, SQ. Pt voiced no concerns nor showed any signs of distress during injection. Pt sat in the office for 15 minutes after injection to make sure she had no side effects.

## 2023-11-13 ENCOUNTER — Other Ambulatory Visit: Payer: Self-pay | Admitting: Internal Medicine

## 2023-11-13 DIAGNOSIS — B029 Zoster without complications: Secondary | ICD-10-CM

## 2023-11-13 DIAGNOSIS — M792 Neuralgia and neuritis, unspecified: Secondary | ICD-10-CM

## 2023-12-08 LAB — HEPATIC FUNCTION PANEL
ALT: 25 IU/L (ref 0–32)
AST: 26 IU/L (ref 0–40)
Albumin: 4.8 g/dL (ref 3.9–4.9)
Alkaline Phosphatase: 77 IU/L (ref 44–121)
Bilirubin Total: 0.5 mg/dL (ref 0.0–1.2)
Bilirubin, Direct: 0.18 mg/dL (ref 0.00–0.40)
Total Protein: 6.7 g/dL (ref 6.0–8.5)

## 2023-12-08 LAB — NMR, LIPOPROFILE
Cholesterol, Total: 185 mg/dL (ref 100–199)
HDL Particle Number: 50.2 umol/L (ref >=30.5)
HDL-C: 86 mg/dL (ref >39)
LDL Particle Number: 1051 nmol/L — ABNORMAL HIGH (ref <1000)
LDL Size: 21.3 nm (ref >20.5)
LDL-C (NIH Calc): 86 mg/dL (ref 0–99)
LP-IR Score: 42 (ref <=45)
Small LDL Particle Number: 531 nmol/L — ABNORMAL HIGH (ref <=527)
Triglycerides: 73 mg/dL (ref 0–149)

## 2023-12-08 LAB — CBC
Hematocrit: 43 % (ref 34.0–46.6)
Hemoglobin: 14 g/dL (ref 11.1–15.9)
MCH: 30.8 pg (ref 26.6–33.0)
MCHC: 32.6 g/dL (ref 31.5–35.7)
MCV: 95 fL (ref 79–97)
Platelets: 240 10*3/uL (ref 150–450)
RBC: 4.54 x10E6/uL (ref 3.77–5.28)
RDW: 13.3 % (ref 11.7–15.4)
WBC: 4.8 10*3/uL (ref 3.4–10.8)

## 2023-12-08 LAB — APOLIPOPROTEIN B: Apolipoprotein B: 73 mg/dL (ref <90)

## 2023-12-08 LAB — LIPOPROTEIN A (LPA): Lipoprotein (a): 173.5 nmol/L — ABNORMAL HIGH (ref <75.0)

## 2023-12-13 ENCOUNTER — Other Ambulatory Visit: Payer: Self-pay

## 2023-12-13 ENCOUNTER — Encounter: Payer: Self-pay | Admitting: Internal Medicine

## 2023-12-13 DIAGNOSIS — Z131 Encounter for screening for diabetes mellitus: Secondary | ICD-10-CM

## 2023-12-13 DIAGNOSIS — Z952 Presence of prosthetic heart valve: Secondary | ICD-10-CM

## 2023-12-13 DIAGNOSIS — I359 Nonrheumatic aortic valve disorder, unspecified: Secondary | ICD-10-CM

## 2023-12-13 DIAGNOSIS — E785 Hyperlipidemia, unspecified: Secondary | ICD-10-CM

## 2023-12-13 DIAGNOSIS — E782 Mixed hyperlipidemia: Secondary | ICD-10-CM

## 2023-12-13 DIAGNOSIS — I351 Nonrheumatic aortic (valve) insufficiency: Secondary | ICD-10-CM

## 2023-12-13 DIAGNOSIS — I10 Essential (primary) hypertension: Secondary | ICD-10-CM

## 2023-12-13 MED ORDER — ROSUVASTATIN CALCIUM 10 MG PO TABS: 10.0000 mg | ORAL_TABLET | Freq: Every day | ORAL | 3 refills | Status: AC

## 2023-12-27 ENCOUNTER — Other Ambulatory Visit (HOSPITAL_COMMUNITY): Payer: Self-pay

## 2023-12-27 ENCOUNTER — Other Ambulatory Visit: Payer: Self-pay

## 2023-12-27 MED ORDER — AMLODIPINE BESYLATE 5 MG PO TABS
5.0000 mg | ORAL_TABLET | Freq: Every day | ORAL | 2 refills | Status: AC
  Filled 2023-12-27: qty 90, 90d supply, fill #0

## 2023-12-28 ENCOUNTER — Other Ambulatory Visit (HOSPITAL_COMMUNITY): Payer: Self-pay

## 2024-01-01 ENCOUNTER — Other Ambulatory Visit: Payer: Self-pay | Admitting: Internal Medicine

## 2024-01-06 ENCOUNTER — Encounter: Payer: Self-pay | Admitting: Internal Medicine

## 2024-01-06 DIAGNOSIS — E034 Atrophy of thyroid (acquired): Secondary | ICD-10-CM

## 2024-01-06 DIAGNOSIS — E785 Hyperlipidemia, unspecified: Secondary | ICD-10-CM

## 2024-01-06 DIAGNOSIS — I10 Essential (primary) hypertension: Secondary | ICD-10-CM

## 2024-01-06 DIAGNOSIS — R7301 Impaired fasting glucose: Secondary | ICD-10-CM

## 2024-01-06 DIAGNOSIS — Z79899 Other long term (current) drug therapy: Secondary | ICD-10-CM

## 2024-01-06 NOTE — Telephone Encounter (Signed)
 I have pended labs for your approval.

## 2024-01-10 ENCOUNTER — Telehealth: Payer: Self-pay | Admitting: Internal Medicine

## 2024-01-10 NOTE — Telephone Encounter (Signed)
 Patient need lab orders.

## 2024-01-11 ENCOUNTER — Encounter: Payer: Self-pay | Admitting: Internal Medicine

## 2024-01-11 NOTE — Telephone Encounter (Signed)
 Lab orders were still pending so I have signed the orders.

## 2024-01-17 ENCOUNTER — Other Ambulatory Visit (INDEPENDENT_AMBULATORY_CARE_PROVIDER_SITE_OTHER)

## 2024-01-17 DIAGNOSIS — I10 Essential (primary) hypertension: Secondary | ICD-10-CM

## 2024-01-17 DIAGNOSIS — E034 Atrophy of thyroid (acquired): Secondary | ICD-10-CM

## 2024-01-17 DIAGNOSIS — Z79899 Other long term (current) drug therapy: Secondary | ICD-10-CM

## 2024-01-17 DIAGNOSIS — R7301 Impaired fasting glucose: Secondary | ICD-10-CM

## 2024-01-17 DIAGNOSIS — E785 Hyperlipidemia, unspecified: Secondary | ICD-10-CM

## 2024-01-17 NOTE — Addendum Note (Signed)
 Addended by: Thressa Flora D on: 01/17/2024 09:34 AM   Modules accepted: Orders

## 2024-01-17 NOTE — Addendum Note (Signed)
 Addended by: Thressa Flora D on: 01/17/2024 09:36 AM   Modules accepted: Orders

## 2024-01-17 NOTE — Addendum Note (Signed)
 Addended by: Chadwick Colonel on: 01/17/2024 09:32 AM   Modules accepted: Orders

## 2024-01-18 ENCOUNTER — Encounter: Payer: Self-pay | Admitting: Internal Medicine

## 2024-01-18 LAB — CBC WITH DIFFERENTIAL/PLATELET
Absolute Lymphocytes: 1470 {cells}/uL (ref 850–3900)
Absolute Monocytes: 511 {cells}/uL (ref 200–950)
Basophils Absolute: 63 {cells}/uL (ref 0–200)
Basophils Relative: 0.9 %
Eosinophils Absolute: 154 {cells}/uL (ref 15–500)
Eosinophils Relative: 2.2 %
HCT: 41.7 % (ref 35.0–45.0)
Hemoglobin: 13.8 g/dL (ref 11.7–15.5)
MCH: 31.2 pg (ref 27.0–33.0)
MCHC: 33.1 g/dL (ref 32.0–36.0)
MCV: 94.3 fL (ref 80.0–100.0)
MPV: 10 fL (ref 7.5–12.5)
Monocytes Relative: 7.3 %
Neutro Abs: 4802 {cells}/uL (ref 1500–7800)
Neutrophils Relative %: 68.6 %
Platelets: 229 10*3/uL (ref 140–400)
RBC: 4.42 10*6/uL (ref 3.80–5.10)
RDW: 13 % (ref 11.0–15.0)
Total Lymphocyte: 21 %
WBC: 7 10*3/uL (ref 3.8–10.8)

## 2024-01-18 LAB — COMPREHENSIVE METABOLIC PANEL WITH GFR
AG Ratio: 2.2 (calc) (ref 1.0–2.5)
ALT: 24 U/L (ref 6–29)
AST: 24 U/L (ref 10–35)
Albumin: 4.6 g/dL (ref 3.6–5.1)
Alkaline phosphatase (APISO): 53 U/L (ref 37–153)
BUN: 11 mg/dL (ref 7–25)
CO2: 27 mmol/L (ref 20–32)
Calcium: 9.3 mg/dL (ref 8.6–10.4)
Chloride: 102 mmol/L (ref 98–110)
Creat: 0.75 mg/dL (ref 0.50–1.05)
Globulin: 2.1 g/dL (ref 1.9–3.7)
Glucose, Bld: 92 mg/dL (ref 65–99)
Potassium: 4.7 mmol/L (ref 3.5–5.3)
Sodium: 140 mmol/L (ref 135–146)
Total Bilirubin: 0.6 mg/dL (ref 0.2–1.2)
Total Protein: 6.7 g/dL (ref 6.1–8.1)
eGFR: 88 mL/min/{1.73_m2} (ref >=60)

## 2024-01-18 LAB — HEMOGLOBIN A1C
Hgb A1c MFr Bld: 5.6 % (ref <5.7)
Mean Plasma Glucose: 114 mg/dL
eAG (mmol/L): 6.3 mmol/L

## 2024-01-18 LAB — LIPID PANEL
Cholesterol: 172 mg/dL (ref <200)
HDL: 93 mg/dL (ref >=50)
LDL Cholesterol (Calc): 65 mg/dL
Non-HDL Cholesterol (Calc): 79 mg/dL (ref <130)
Total CHOL/HDL Ratio: 1.8 (calc) (ref <5.0)
Triglycerides: 62 mg/dL (ref <150)

## 2024-01-18 LAB — LDL CHOLESTEROL, DIRECT: Direct LDL: 71 mg/dL (ref <100)

## 2024-01-18 LAB — TSH: TSH: 1.02 m[IU]/L (ref 0.40–4.50)

## 2024-01-19 LAB — MICROALBUMIN / CREATININE URINE RATIO
Creatinine, Urine: 16 mg/dL — ABNORMAL LOW (ref 20–275)
Microalb, Ur: <0.2 mg/dL

## 2024-01-20 ENCOUNTER — Encounter: Payer: Self-pay | Admitting: Internal Medicine

## 2024-01-24 ENCOUNTER — Ambulatory Visit: Payer: Medicare Other | Admitting: Internal Medicine

## 2024-01-24 ENCOUNTER — Encounter: Payer: Self-pay | Admitting: Internal Medicine

## 2024-01-24 VITALS — BP 122/76 | HR 74 | Ht >= 80 in | Wt 143.8 lb

## 2024-01-24 DIAGNOSIS — E034 Atrophy of thyroid (acquired): Secondary | ICD-10-CM

## 2024-01-24 DIAGNOSIS — M791 Myalgia, unspecified site: Secondary | ICD-10-CM

## 2024-01-24 DIAGNOSIS — Q249 Congenital malformation of heart, unspecified: Secondary | ICD-10-CM

## 2024-01-24 DIAGNOSIS — E559 Vitamin D deficiency, unspecified: Secondary | ICD-10-CM

## 2024-01-24 DIAGNOSIS — I10 Essential (primary) hypertension: Secondary | ICD-10-CM | POA: Diagnosis not present

## 2024-01-24 DIAGNOSIS — Z0000 Encounter for general adult medical examination without abnormal findings: Secondary | ICD-10-CM | POA: Diagnosis not present

## 2024-01-24 DIAGNOSIS — E785 Hyperlipidemia, unspecified: Secondary | ICD-10-CM

## 2024-01-24 DIAGNOSIS — R7301 Impaired fasting glucose: Secondary | ICD-10-CM

## 2024-01-24 DIAGNOSIS — M810 Age-related osteoporosis without current pathological fracture: Secondary | ICD-10-CM

## 2024-01-24 LAB — CK: Total CK: 94 U/L (ref 20–243)

## 2024-01-24 LAB — VITAMIN D 25 HYDROXY (VIT D DEFICIENCY, FRACTURES): Vit D, 25-Hydroxy: 54 ng/mL (ref 30–100)

## 2024-01-24 MED ORDER — LEVOTHYROXINE SODIUM 100 MCG PO TABS: 100.0000 ug | ORAL_TABLET | Freq: Every day | ORAL | 1 refills | Status: DC

## 2024-01-24 NOTE — Assessment & Plan Note (Addendum)

## 2024-01-24 NOTE — Assessment & Plan Note (Signed)
Quadruple leaflet aortic valve/aortic insufficiency .  S/p AVR Jan 2024

## 2024-01-24 NOTE — Assessment & Plan Note (Addendum)
 Therapy has been  started with Prolia  Feb 2025 . Drinks almond milk for calcium  and 1000 ius of D3   daily,  checking level toay

## 2024-01-24 NOTE — Progress Notes (Signed)
 The patient is here for the Welcome to  Medicare  preventive visit     has a past medical history of Allergy, Anxiety, Aortic regurgitation (02/17/2018), Arthritis, Atrial fibrillation (HCC) (10/04/2022), Breast screening, unspecified, Cataract, Clotting disorder (HCC) (10/2017), Essential hypertension (01/22/2020), H/O: Bell's palsy, Headache, Heart murmur, History of kidney stones, Hypercalcemia, Neuromuscular disorder (HCC) (02/2000), Osteoporosis, Screening for malignant neoplasm of the cervix, Special screening for malignant neoplasms, colon, Unspecified hypothyroidism, and Unspecified otitis media.    reports that she has never smoked. She has never used smokeless tobacco. She reports current alcohol  use of about 1.0 standard drink of alcohol  per week. She reports that she does not use drugs.   The roster of all physicians providing medical care to patient : Patient Care Team: Thersia Flax, MD as PCP - General (Internal Medicine) Elmyra Haggard, MD as PCP - Cardiology (Cardiology)  Activities of daily living:  The patient is 100% independent in all ADLs: dressing, toileting, feeding as well as independent mobility Fall risk was assessed by direct patient evaluation of patient's balance, gait and ability to risk from a chair and from a kneeling position. Home safety : The patient has smoke detectors in the home. They wear seatbelts.  There are no firearms at home. There is no violence in the home.  Patient has seen their eye doctor in the last year.   Visual acuity was assessed today  and was 20/20 with correction lenses. Patient denies hearing difficulty with regard to whispered voices and some television programs and has  deferred audiologic testing in the last year.   There is no risks for hepatitis, STDs or HIV. There is no   history of blood transfusion. They have no travel history to infectious disease endemic areas of the world.  The patient has seen their dentist in the last six month.   They do not  have excessive sun exposure. Discussed the need for sun protection: hats, long sleeves and use of sunscreen if there is significant sun exposure.   Diet: the importance of a healthy diet is discussed. They do have a healthy diet.  The benefits of regular aerobic exercise were discussed. Patient walks 4 times per week ,  a minimum of 20 minutes.   Depression screen:      01/24/2024    1:39 PM 09/16/2023    1:09 PM 07/27/2023    9:29 AM 06/24/2023    8:32 AM 01/24/2023   10:13 AM  Depression screen PHQ 2/9  Decreased Interest 0 0 0 0 0  Down, Depressed, Hopeless 0 0  0 0  PHQ - 2 Score 0 0 0 0 0  Altered sleeping  1 1 1    Tired, decreased energy  1 0 1   Change in appetite  0 0 0   Feeling bad or failure about yourself   0 0 0   Trouble concentrating  0 0 0   Moving slowly or fidgety/restless  0 0 0   Suicidal thoughts  0 0 0   PHQ-9 Score  2 1 2    Difficult doing work/chores  Not difficult at all Not difficult at all Somewhat difficult        Cognitive assessment: the patient manages all their financial and personal affairs and is actively engaged. They could relate day,date,year and events; recalled 2/3 objects at 3 minutes; performed clock-face test normally.  The following portions of the patient's history were reviewed and updated as appropriate: allergies, current medications, past  family history, past medical history,  past surgical history, past social history  and problem list.  During the course of the visit the patient was educated and counseled about appropriate screening and preventive services including : fall prevention , diabetes screening, nutrition counseling, colorectal cancer screening, and recommended immunizations   Immunization History  Administered Date(s) Administered   Influenza, High Dose Seasonal PF 07/27/2023   Influenza,inj,Quad PF,6+ Mos 07/19/2022   Influenza-Unspecified 05/31/2017, 06/20/2018, 04/26/2019   MMR 04/13/2018, 05/11/2018    PFIZER(Purple Top)SARS-COV-2 Vaccination 12/14/2019, 01/09/2020, 07/03/2020, 01/27/2021   PNEUMOCOCCAL CONJUGATE-20 07/27/2023   Pfizer(Comirnaty)Fall Seasonal Vaccine 12 years and older 08/21/2022   Tdap 09/17/2011, 10/13/2021   Zoster Recombinant(Shingrix ) 03/23/2018, 05/25/2018  HMLISTPATIENTFRIENDLY@ There are no preventive care reminders to display for this patient.  Last skin cancer screening : annually      Vital Signs: BP 122/76   Pulse 74   Ht (!) 9' (2.743 m)   Wt 143 lb 12.8 oz (65.2 kg)   SpO2 95%   BMI 8.67 kg/m    Exam: General appearance: alert, cooperative and appears stated age Head: Normocephalic, without obvious abnormality, atraumatic Eyes: conjunctivae/corneas clear. PERRL, EOM's intact. Fundi benign. Ears: normal TM's and external ear canals both ears Nose: Nares normal. Septum midline. Mucosa normal. No drainage or sinus tenderness. Throat: lips, mucosa, and tongue normal; teeth and gums normal Neck: no adenopathy, no carotid bruit, no JVD, supple, symmetrical, trachea midline and thyroid  not enlarged, symmetric, no tenderness/mass/nodules Lungs: clear to auscultation bilaterally Breasts: deferred by patient  Heart: regular rate and rhythm, S1, S2 normal, click of prosthetic aortic valve present  Abdomen: soft, non-tender; bowel sounds normal; no masses,  no organomegaly Extremities: extremities normal, atraumatic, no cyanosis or edema Pulses: 2+ and symmetric Skin: Skin color, texture, turgor normal. No rashes or lesions Neurologic: Alert and oriented X 3, normal strength and tone. Normal symmetric reflexes. Normal coordination and gait.     End of Life Discussion and Planning   During the course of the visit , End of Life objectives were discussed at length.  Patient does not have a living will in place or a healthcare power of attorney.  Patient  was given printed information about advance directives and encouraged to return after discussing with  their family.  Review of Opioid Prescriptions    Patient does not have a current opioid prescription Patient's risk factors for opioid use disorder was reviewed and includes nothing Treatment of pain using non-opioid alternatives was reviewed  and encouraged   Patient risk for potential substance abuse was assessed and addressed with counselling.   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 Drinks almond milk for calcium  and 1000 ius of D3   daily,  checking level toay    Welcome to [employer] preventive visit age appropriate education and counseling updated, referrals for preventative services and immunizations addressed, dietary and smoking counseling addressed, most recent labs reviewed.  I have personally reviewed and have noted:   1) the patient's medical and social history 2) The pt's use of alcohol , tobacco, and illicit drugs 3) The patient's current medications and supplements 4) Functional ability including ADL's, fall risk, home safety risk, hearing and visual impairment 5) Diet and physical activities 6) Evidence for depression or mood disorder 7) The patient's height, weight, and BMI have been recorded in the  chart    I have made referrals, and provided counseling and education based on review of the above

## 2024-01-25 ENCOUNTER — Ambulatory Visit: Payer: Self-pay | Admitting: Internal Medicine

## 2024-01-25 NOTE — Telephone Encounter (Signed)
 For now OK to go with current lipids   LDL looks very good   Keep on same medicines

## 2024-02-13 ENCOUNTER — Ambulatory Visit (INDEPENDENT_AMBULATORY_CARE_PROVIDER_SITE_OTHER): Admitting: Internal Medicine

## 2024-02-13 ENCOUNTER — Ambulatory Visit (INDEPENDENT_AMBULATORY_CARE_PROVIDER_SITE_OTHER)

## 2024-02-13 ENCOUNTER — Encounter: Payer: Self-pay | Admitting: Internal Medicine

## 2024-02-13 VITALS — BP 122/70 | HR 65 | Ht 67.0 in | Wt 146.0 lb

## 2024-02-13 DIAGNOSIS — M546 Pain in thoracic spine: Secondary | ICD-10-CM

## 2024-02-13 DIAGNOSIS — R109 Unspecified abdominal pain: Secondary | ICD-10-CM | POA: Diagnosis not present

## 2024-02-13 LAB — POCT URINALYSIS DIPSTICK
Bilirubin, UA: NEGATIVE
Blood, UA: NEGATIVE
Glucose, UA: NEGATIVE
Ketones, UA: NEGATIVE
Leukocytes, UA: NEGATIVE
Nitrite, UA: NEGATIVE
Protein, UA: NEGATIVE
Spec Grav, UA: 1.015 (ref 1.010–1.025)
Urobilinogen, UA: 0.2 U/dL
pH, UA: 7 (ref 5.0–8.0)

## 2024-02-13 MED ORDER — OXYCODONE-ACETAMINOPHEN 10-325 MG PO TABS: 1.0000 | ORAL_TABLET | Freq: Three times a day (TID) | ORAL | 0 refills | Status: AC | PRN

## 2024-02-13 NOTE — Progress Notes (Signed)
 Subjective:  Patient ID: Theresa [name], female    DOB: 03/06/1956  Age: 68 y.o. MRN: [id number]  CC: The primary encounter diagnosis was Acute left-sided thoracic back pain. A diagnosis of Acute left flank pain was also pertinent to this visit.   HPI Theresa [name] presents for  Chief Complaint  Patient presents with   Back Pain   Left sided back pain for the last 10 days   . THE PAIN IS MOSTLY DULL BUT OCCASIONALLY SHARP AT NIGHT,  KEEPING HER UP,  NO HEMATURIA ,   CHILLS,  MILD HEADACHE AND NAUSEA  SOME PIN IN THE LEFT GROIN .  NO VAGINAL DISCHARGE. NO RECENT UNUSUAL ACTIVITY.  USING ICE TYLENOL  AND HEAT .  NO CHANGES OVER TEN DAYS.  SLIGHTLY BETTER YESTERDAY BUT WORSE AGAIN TODAY 6/10    HISTORY of ASYMPTOMATIC LEFT KIDNEY STONE 5 MM noted on plain abd films done in 2018   Outpatient Medications Prior to Visit  Medication Sig Dispense Refill   ALPRAZolam  (XANAX  XR) 0.5 MG 24 hr tablet TAKE 1 TABLET BY MOUTH AT BEDTIME AS NEEDED FOR ANXIETY 30 tablet 5   amLODipine  (NORVASC ) 5 MG tablet Take 1 tablet (5 mg total) by mouth daily. 90 tablet 2   aspirin  EC 325 MG tablet Take 1 tablet (325 mg total) by mouth daily.     carboxymethylcellulose (REFRESH TEARS) 0.5 % SOLN Place 1 drop into both eyes 4 (four) times daily.     cetirizine (ZYRTEC) 10 MG tablet Take 10 mg by mouth in the morning.     Cholecalciferol  (VITAMIN D3) 1000 units CAPS Take 1,000 Units by mouth at bedtime.     denosumab  (PROLIA ) 60 MG/ML SOSY injection Inject 60 mg into the skin every 6 (six) months. 1 mL 1   famotidine  (PEPCID ) 20 MG tablet Take 20 mg by mouth daily.     gabapentin  (NEURONTIN ) 100 MG capsule TAKE 2 CAPSULES BY MOUTH THREE TIMES DAILY 180 capsule 1   levothyroxine  (SYNTHROID ) 100 MCG tablet Take 1 tablet (100 mcg total) by mouth daily. 90 tablet 1   Magnesium  250 MG TABS Take 250 mg by mouth at bedtime.     Multiple Vitamin (MULTIVITAMIN WITH MINERALS) TABS tablet Take 1 tablet by mouth  every evening.     Probiotic Product (PROBIOTIC PEARLS WOMENS PO) Take 1 capsule by mouth daily after breakfast.     rosuvastatin  (CRESTOR ) 10 MG tablet Take 1 tablet (10 mg total) by mouth daily. 90 tablet 3   amoxicillin  (AMOXIL ) 500 MG tablet Take 4 tablets (2 grams/ 2000 mg total) by mouth one hour prior to dental procedure. (Patient not taking: Reported on 02/13/2024) 4 tablet 6   No facility-administered medications prior to visit.    Review of Systems;  Patient denies headache, fevers, malaise, unintentional weight loss, skin rash, eye pain, sinus congestion and sinus pain, sore throat, dysphagia,  hemoptysis , cough, dyspnea, wheezing, chest pain, palpitations, orthopnea, edema, abdominal pain, nausea, melena, diarrhea, constipation, flank pain, dysuria, hematuria, urinary  Frequency, nocturia, numbness, tingling, seizures,  Focal weakness, Loss of consciousness,  Tremor, insomnia, depression, anxiety, and suicidal ideation.      Objective:  BP 122/70   Pulse 65   Ht 5\' 7"  (1.702 m)   Wt 146 lb (66.2 kg)   SpO2 96%   BMI 22.87 kg/m   BP Readings from Last 3 Encounters:  02/13/24 122/70  01/24/24 122/76  10/10/23 (!) 132/100    Wt Readings  from Last 3 Encounters:  02/13/24 146 lb (66.2 kg)  01/24/24 143 lb 12.8 oz (65.2 kg)  10/10/23 148 lb 12.8 oz (67.5 kg)    Physical Exam Vitals reviewed.  Constitutional:      General: She is not in acute distress.    Appearance: Normal appearance. She is normal weight. She is not ill-appearing, toxic-appearing or diaphoretic.  HENT:     Head: Normocephalic.  Eyes:     General: No scleral icterus.       Right eye: No discharge.        Left eye: No discharge.     Conjunctiva/sclera: Conjunctivae normal.  Cardiovascular:     Rate and Rhythm: Normal rate and regular rhythm.     Heart sounds: Normal heart sounds.  Pulmonary:     Effort: Pulmonary effort is normal. No respiratory distress.     Breath sounds: Normal breath  sounds.  Musculoskeletal:        General: Normal range of motion.     Comments: Tender to percussion of left flank  Skin:    General: Skin is warm and dry.  Neurological:     General: No focal deficit present.     Mental Status: She is alert and oriented to person, place, and time. Mental status is at baseline.  Psychiatric:        Mood and Affect: Mood normal.        Behavior: Behavior normal.        Thought Content: Thought content normal.        Judgment: Judgment normal.    Lab Results  Component Value Date   HGBA1C 5.6 01/17/2024   HGBA1C 5.4 09/22/2022   HGBA1C 5.6 07/21/2022    Lab Results  Component Value Date   CREATININE 0.82 02/13/2024   CREATININE 0.75 01/17/2024   CREATININE 0.80 07/25/2023    Lab Results  Component Value Date   WBC 7.0 01/17/2024   HGB 13.8 01/17/2024   HCT 41.7 01/17/2024   PLT 229 01/17/2024   GLUCOSE 79 02/13/2024   CHOL 172 01/17/2024   TRIG 62 01/17/2024   HDL 93 01/17/2024   LDLDIRECT 71 01/17/2024   LDLCALC 65 01/17/2024   ALT 24 01/17/2024   AST 24 01/17/2024   NA 143 02/13/2024   K 4.6 02/13/2024   CL 105 02/13/2024   CREATININE 0.82 02/13/2024   BUN 12 02/13/2024   CO2 29 02/13/2024   TSH 1.02 01/17/2024   INR 1.4 (H) 09/24/2022   HGBA1C 5.6 01/17/2024   MICROALBUR <0.2 01/17/2024    MM 3D SCREENING MAMMOGRAM BILATERAL BREAST Result Date: 08/25/2023 CLINICAL DATA:  Screening. EXAM: DIGITAL SCREENING BILATERAL MAMMOGRAM WITH TOMOSYNTHESIS AND CAD TECHNIQUE: Bilateral screening digital craniocaudal and mediolateral oblique mammograms were obtained. Bilateral screening digital breast tomosynthesis was performed. The images were evaluated with computer-aided detection. COMPARISON:  Previous exam(s). ACR Breast Density Category b: There are scattered areas of fibroglandular density. FINDINGS: There are no findings suspicious for malignancy. IMPRESSION: No mammographic evidence of malignancy. A result letter of this  screening mammogram will be mailed directly to the patient. RECOMMENDATION: Screening mammogram in one year. (Code:SM-B-01Y) BI-RADS CATEGORY  1: Negative. Electronically Signed   By: Alinda Apley M.D.   On: 08/25/2023 08:00   DG Bone Density Result Date: 08/23/2023 EXAM: DUAL X-RAY ABSORPTIOMETRY (DXA) FOR BONE MINERAL DENSITY IMPRESSION: Your patient Cadi Rhinehart completed a BMD test on 08/23/2023 using the Lunar iDXA DXA System (software version: 14.10) manufactured by [employer] LUNAR. The following summarizes the results of our evaluation. Technologist:VLM PATIENT BIOGRAPHICAL: Name: Texie, Tupou Patient ID: [id number] Birth Date: [birth year] Height: 67.0 in. Gender: Female Exam Date: 08/23/2023 Weight: 151.0 lbs. Indications: Caucasian, Family Hist. (Parent hip fracture), Family Hx of Osteoporosis, Height Loss, Hip Replacement Left, History of Fracture (Adult), Hypothyroid, Hysterectomy, Osteopenia, Parent Hip Fracture, Postmenopausal Fractures: Tibia Treatments: CALCIUM  VIT D, Gabapentin , LEVOTHYROXINE , Multi-Vitamin, Synthroid , Vitamin D , ZYRTEC DENSITOMETRY RESULTS: Site        Region Measured Date Measured Age WHO Classification Young Adult T-score BMD         %Change vs. Previous Significant Change (*) AP Spine L1-L2 08/23/2023 65.3 Osteopenia -1.6 0.983 g/cm2 -2.1% - AP Spine L1-L2 08/17/2021 63.3 Osteopenia -1.4 1.004 g/cm2 3.5% - AP Spine L1-L2 09/03/2019 61.4 Osteopenia -1.7 0.970 g/cm2 4.2% - AP Spine L1-L2 08/30/2017 59.4 Osteopenia -2.0 0.931 g/cm2 -1.7% - AP Spine L1-L2 08/04/2015 57.3 Osteopenia -1.9 0.947 g/cm2 2.3% - AP Spine L1-L2 08/02/2013 55.3 Osteopenia -2.0 0.926 g/cm2 -8.9% Yes AP Spine L1-L2 10/07/2010 52.5 Osteopenia -1.3 1.016 g/cm2 -1.1% - AP Spine L1-L2 09/19/2008 50.4 Osteopenia -1.2 1.027 g/cm2 - - Right Femur Total 08/23/2023 65.3 Osteoporosis -2.5 0.699 g/cm2 -5.3% Yes Right Femur Total 08/17/2021 63.3 Osteopenia -2.1 0.738 g/cm2 -2.1% - Right Femur Total  09/03/2019 61.4 Osteopenia -2.0 0.754 g/cm2 3.4% Yes Right Femur Total 08/30/2017 59.4 Osteopenia -2.2 0.729 g/cm2 -5.3% Yes Right Femur Total 08/04/2015 57.3 Osteopenia -1.9 0.770 g/cm2 1.7% - Right Femur Total 08/02/2013 55.3 Osteopenia -2.0 0.757 g/cm2 -10.2% Yes Right Femur Total 10/07/2010 52.5 Osteopenia -1.3 0.843 g/cm2 -2.8% Yes Right Femur Total 09/19/2008 50.4 Osteopenia -1.1 0.867 g/cm2 - - ASSESSMENT: The BMD measured at Femur Total is 0.699 g/cm2 with a T-score of -2.5. This patient's diagnostic category is OSTEOPOROSIS according to World Health Organization [facility]) criteria. L-3 & L-4 were excluded due to surgical hardware. Compared with 08/17/2021. Since the prior study, there has been a SIGNIFICANT DECREASE in bone mineral density of the RIGHT hip (-5.3%). Since the prior study, there has been NO SIGNIFICANT CHANGE in bone mineral density of the lumbar spine. The scan quality is good. World Science writer [facility]) criteria for post-menopausal, Caucasian Women: Normal:                   T-score at or above -1 SD Osteopenia/low bone mass: T-score between -1 and -2.5 SD Osteoporosis:             T-score at or below -2.5 SD RECOMMENDATIONS: 1. All patients should optimize calcium  and vitamin D  intake. 2. Consider FDA-approved medical therapies in postmenopausal women and men aged 48 years and older, based on the following: a. A hip or vertebral(clinical or morphometric) fracture b. T-score < -2.5 at the femoral neck or spine after appropriate evaluation to exclude secondary causes c. Low bone mass (T-score between -1.0 and -2.5 at the femoral neck or spine) and a 10-year probability of a hip fracture > 3% or a 10-year probability of a major osteoporosis-related fracture > 20% based on the [place] -adapted WHO algorithm 3. Clinician judgment and/or patient preferences may indicate treatment for people with 10-year fracture probabilities above or below these levels FOLLOW-UP: People with diagnosed cases of  osteoporosis or at high risk for fracture should have regular bone mineral density tests. For patients eligible for Medicare, routine testing is allowed once every 2 years. The testing frequency can be increased to one year for patients who have rapidly progressing disease, those who  are receiving or discontinuing medical therapy to restore bone mass, or have additional risk factors. I have reviewed this report, and agree with the above findings. [facility] Electronically Signed   By: Sundra Engel M.D.   On: 08/23/2023 11:55    Assessment & Plan:  .Acute left-sided thoracic back pain -     Urinalysis, Routine w reflex microscopic  Acute left flank pain Assessment & Plan: Workup for nephrolithiasis in progress.  POC UA was negative for hematuria   Orders: -     POCT urinalysis dipstick -     DG Abd 1 View; Future -     Basic metabolic panel with GFR -     Urine Culture  Other orders -     oxyCODONE -Acetaminophen ; Take 1 tablet by mouth every 8 (eight) hours as needed for up to 5 days for pain.  Dispense: 15 tablet; Refill: 0 -     MICROSCOPIC MESSAGE     Follow-up: No follow-ups on file.   Thersia Flax, MD

## 2024-02-13 NOTE — Patient Instructions (Addendum)
 Percocet sent to [facility] with 1/2 tablet every 6 hours   Increase water intake  CT scan if kidney function changes

## 2024-02-14 ENCOUNTER — Ambulatory Visit: Payer: Self-pay | Admitting: Internal Medicine

## 2024-02-14 DIAGNOSIS — R10A2 Flank pain, left side: Secondary | ICD-10-CM | POA: Insufficient documentation

## 2024-02-14 DIAGNOSIS — R109 Unspecified abdominal pain: Secondary | ICD-10-CM | POA: Insufficient documentation

## 2024-02-14 LAB — URINALYSIS, ROUTINE W REFLEX MICROSCOPIC
Bacteria, UA: NONE SEEN /HPF
Bilirubin Urine: NEGATIVE
Glucose, UA: NEGATIVE
Hgb urine dipstick: NEGATIVE
Hyaline Cast: NONE SEEN /LPF
Ketones, ur: NEGATIVE
Nitrite: NEGATIVE
Protein, ur: NEGATIVE
RBC / HPF: NONE SEEN /HPF (ref 0–2)
Specific Gravity, Urine: 1.006 (ref 1.001–1.035)
Squamous Epithelial / HPF: NONE SEEN /HPF (ref <=5)
WBC, UA: NONE SEEN /HPF (ref 0–5)
pH: 7.5 (ref 5.0–8.0)

## 2024-02-14 LAB — BASIC METABOLIC PANEL WITH GFR
BUN: 12 mg/dL (ref 7–25)
CO2: 29 mmol/L (ref 20–32)
Calcium: 9.8 mg/dL (ref 8.6–10.4)
Chloride: 105 mmol/L (ref 98–110)
Creat: 0.82 mg/dL (ref 0.50–1.05)
Glucose, Bld: 79 mg/dL (ref 65–99)
Potassium: 4.6 mmol/L (ref 3.5–5.3)
Sodium: 143 mmol/L (ref 135–146)
eGFR: 79 mL/min/{1.73_m2} (ref >=60)

## 2024-02-14 LAB — MICROSCOPIC MESSAGE

## 2024-02-14 LAB — URINE CULTURE
MICRO NUMBER:: 16526561
Result:: NO GROWTH
SPECIMEN QUALITY:: ADEQUATE

## 2024-02-14 NOTE — Assessment & Plan Note (Signed)
 Workup for nephrolithiasis in progress.  POC UA was negative for hematuria

## 2024-03-12 ENCOUNTER — Encounter: Payer: Self-pay | Admitting: Internal Medicine

## 2024-03-12 DIAGNOSIS — Z952 Presence of prosthetic heart valve: Secondary | ICD-10-CM

## 2024-03-12 DIAGNOSIS — I359 Nonrheumatic aortic valve disorder, unspecified: Secondary | ICD-10-CM

## 2024-03-19 ENCOUNTER — Encounter: Payer: Self-pay | Admitting: Internal Medicine

## 2024-03-19 ENCOUNTER — Ambulatory Visit: Payer: Self-pay

## 2024-03-19 NOTE — Telephone Encounter (Signed)
 FYI Only or Action Required?: FYI only for provider.  Patient was last seen in primary care on 02/13/2024 by [name], MD. Called Nurse Triage reporting Urinary Tract Infection. Symptoms began yesterday. Interventions attempted: Nothing. Symptoms are: gradually worsening.  Triage Disposition: See Physician Within 24 Hours Apt tomorrow Patient/caregiver understands and will follow disposition?: Yes   Copied from CRM [id number]. Topic: Clinical - Red Word Triage >> Mar 19, 2024 10:59 AM Franky GRADE wrote: Red Word that prompted transfer to Nurse Triage: Patient is experiencing UTI symptoms. Reason for Disposition  Age > 50 years  Answer Assessment - Initial Assessment Questions 1. SEVERITY: How bad is the pain?  (e.g., Scale 1-10; mild, moderate, or severe)   - MILD (1-3): complains slightly about urination hurting   - MODERATE (4-7): interferes with normal activities     - SEVERE (8-10): excruciating, unwilling or unable to urinate because of the pain      mild 2. FREQUENCY: How many times have you had painful urination today?      Burning every time 3. PATTERN: Is pain present every time you urinate or just sometimes?      Every time 4. ONSET: When did the painful urination start?      yesterday 5. FEVER: Do you have a fever? If Yes, ask: What is your temperature, how was it measured, and when did it start?     no 6. PAST UTI: Have you had a urine infection before? If Yes, ask: When was the last time? and What happened that time?      yes 7. CAUSE: What do you think is causing the painful urination?  (e.g., UTI, scratch, Herpes sore)     uti 8. OTHER SYMPTOMS: Do you have any other symptoms? (e.g., blood in urine, flank pain, genital sores, urgency, vaginal discharge)     Urgency and frequency 9. PREGNANCY: Is there any chance you are pregnant? When was your last menstrual period?     na  Protocols used: Urination Pain - Female-A-AH

## 2024-03-19 NOTE — Telephone Encounter (Signed)
 Pt is scheduled to see Rollene tomorrow.

## 2024-03-20 ENCOUNTER — Ambulatory Visit (INDEPENDENT_AMBULATORY_CARE_PROVIDER_SITE_OTHER): Admitting: Family

## 2024-03-20 ENCOUNTER — Encounter: Payer: Self-pay | Admitting: Family

## 2024-03-20 VITALS — BP 118/76 | HR 69 | Temp 98.2°F | Ht 66.0 in | Wt 144.6 lb

## 2024-03-20 DIAGNOSIS — R300 Dysuria: Secondary | ICD-10-CM | POA: Insufficient documentation

## 2024-03-20 DIAGNOSIS — R109 Unspecified abdominal pain: Secondary | ICD-10-CM | POA: Diagnosis not present

## 2024-03-20 LAB — POCT URINALYSIS DIPSTICK
Bilirubin, UA: NEGATIVE
Glucose, UA: NEGATIVE
Ketones, UA: NEGATIVE
Nitrite, UA: NEGATIVE
Protein, UA: NEGATIVE
Spec Grav, UA: 1.01 (ref 1.010–1.025)
Urobilinogen, UA: 0.2 U/dL
pH, UA: 7 (ref 5.0–8.0)

## 2024-03-20 MED ORDER — NITROFURANTOIN MONOHYD MACRO 100 MG PO CAPS: 100.0000 mg | ORAL_CAPSULE | Freq: Two times a day (BID) | ORAL | 0 refills | Status: DC

## 2024-03-20 NOTE — Patient Instructions (Signed)
 We will go ahead and start macrobid  ( antibiotic) due to concern for infection. Continue probiotics.   Please let me know if flank pain, fever, chills, nausea were to present as that would NOT be a typical urinary tract infection.  Nice to meet you.   Urinary Tract Infection, Female A urinary tract infection (UTI) is an infection in your urinary tract. The urinary tract is made up of organs that make, store, and get rid of pee (urine) in your body. These organs include: The kidneys. The ureters. The bladder. The urethra. What are the causes? Most UTIs are caused by germs called bacteria. They may be in or near your genitals. These germs grow and cause swelling in your urinary tract. What increases the risk? You're more likely to get a UTI if: You're a female. The urethra is shorter in females than in males. You have a soft tube called a catheter that drains your pee. You can't control when you pee or poop. You have trouble peeing because of: A kidney stone. A urinary blockage. A nerve condition that affects your bladder. Not getting enough to drink. You're sexually active. You use a birth control inside your vagina, like spermicide. You're pregnant. You have low levels of the hormone estrogen in your body. You're an older adult. You're also more likely to get a UTI if you have other health problems. These may include: Diabetes. A weak immune system. Your immune system is your body's defense system. Sickle cell disease. Injury of the spine. What are the signs or symptoms? Symptoms may include: Needing to pee right away. Peeing small amounts often. Pain or burning when you pee. Blood in your pee. Pee that smells bad or odd. Pain in your belly or lower back. You may also: Feel confused. This may be the first symptom in older adults. Vomit. Not feel hungry. Feel tired or easily annoyed. Have a fever or chills. How is this diagnosed? A UTI is diagnosed based on your  medical history and an exam. You may also have other tests. These may include: Pee tests. Blood tests. Tests for sexually transmitted infections (STIs). If you've had more than one UTI, you may need to have imaging studies done to find out why you keep getting them. How is this treated? A UTI can be treated by: Taking antibiotics or other medicines. Drinking enough fluid to keep your pee pale yellow. In rare cases, a UTI can cause a very bad condition called sepsis. Sepsis may be treated in the hospital. Follow these instructions at home: Medicines Take your medicines only as told by your health care provider. If you were given antibiotics, take them as told by your provider. Do not stop taking them even if you start to feel better. General instructions Make sure you: Pee often and fully. Do not hold your pee for a long time. Wipe from front to back after you pee or poop. Use each tissue only once when you wipe. Pee after you have sex. Do not douche or use sprays or powders in your genital area. Contact a health care provider if: Your symptoms don't get better after 1-2 days of taking antibiotics. Your symptoms go away and then come back. You have a fever or chills. You vomit or feel like you may vomit. Get help right away if: You have very bad pain in your back or lower belly. You faint. This information is not intended to replace advice given to you by your health care provider. Make  sure you discuss any questions you have with your health care provider. Document Revised: 08/10/2023 Document Reviewed: 12/03/2022 Elsevier Patient Education  2025 [employer].

## 2024-03-20 NOTE — Assessment & Plan Note (Addendum)
 Afebrile. No CVA tenderness. Reviewed prior Ab XR and no definitive renal calculi.She politely declines CT renal in the absence of flank pain.  UA  POC shows trace leukocytes, rbcs. Pending UA, urine culture. Start macrobid  ahead of urine culture. Return precautions given.

## 2024-03-20 NOTE — Progress Notes (Unsigned)
 Assessment & Plan:  Dysuria Assessment & Plan: Afebrile. No CVA tenderness. Reviewed prior Ab XR and no definitive renal calculi.She politely declines CT renal in the absence of flank pain.  UA  POC shows trace leukocytes, rbcs. Pending UA, urine culture. Start macrobid  ahead of urine culture. Return precautions given.   Orders: -     POCT urinalysis dipstick -     Urine Culture -     Urinalysis, Routine w reflex microscopic -     Nitrofurantoin  Monohyd Macro; Take 1 capsule (100 mg total) by mouth 2 (two) times daily. Take with food.  Dispense: 10 capsule; Refill: 0  Acute left flank pain  Other orders -     MICROSCOPIC MESSAGE     Return precautions given.   Risks, benefits, and alternatives of the medications and treatment plan prescribed today were discussed, and patient expressed understanding.   Education regarding symptom management and diagnosis given to patient on AVS either electronically or printed.  No follow-ups on file.  Rollene Northern, FNP  Subjective:    Patient ID: Theresa [name], female    DOB: [birth year], 68 y.o.   MRN: [id number]  CC: Theresa [name] is a 68 y.o. female who presents today for an acute visit.    HPI: Complains of left low back flank pain x 3 days ago, some better today.  Left flank pain has improved.  Associated with dysuria and 'dribbling urine'  Endorses fever ( tmax 99), suprapubic tenderness.   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  H/o TAH, cervical fusion, cholecystectomy  Allergies: Alendronate, Mobic [meloxicam], Topamax [topiramate], and Tramadol Current Outpatient Medications on File Prior to Visit  Medication Sig Dispense Refill   ALPRAZolam  (XANAX  XR) 0.5 MG 24 hr tablet TAKE 1 TABLET BY MOUTH AT BEDTIME AS NEEDED FOR ANXIETY 30 tablet 5   amLODipine  (NORVASC ) 5 MG tablet Take 1 tablet (5 mg  total) by mouth daily. 90 tablet 2   aspirin  EC 325 MG tablet Take 1 tablet (325 mg total) by mouth daily.     carboxymethylcellulose (REFRESH TEARS) 0.5 % SOLN Place 1 drop into both eyes 4 (four) times daily.     cetirizine (ZYRTEC) 10 MG tablet Take 10 mg by mouth in the morning.     Cholecalciferol  (VITAMIN D3) 1000 units CAPS Take 1,000 Units by mouth at bedtime.     denosumab  (PROLIA ) 60 MG/ML SOSY injection Inject 60 mg into the skin every 6 (six) months. 1 mL 1   famotidine  (PEPCID ) 20 MG tablet Take 20 mg by mouth daily.     gabapentin  (NEURONTIN ) 100 MG capsule TAKE 2 CAPSULES BY MOUTH THREE TIMES DAILY 180 capsule 1   levothyroxine  (SYNTHROID ) 100 MCG tablet Take 1 tablet (100 mcg total) by mouth daily. 90 tablet 1   Magnesium  250 MG TABS Take 250 mg by mouth at bedtime.     Multiple Vitamin (MULTIVITAMIN WITH MINERALS) TABS tablet Take 1 tablet by mouth every evening.     Probiotic Product (PROBIOTIC PEARLS WOMENS PO) Take 1 capsule by mouth daily after breakfast.     rosuvastatin  (CRESTOR ) 10 MG tablet Take 1 tablet (10 mg total) by mouth daily. 90 tablet 3   amoxicillin  (AMOXIL ) 500 MG tablet Take 4 tablets (2 grams/ 2000 mg total) by mouth one hour prior to dental procedure. (Patient not taking: Reported on 03/20/2024)  4 tablet 6   No current facility-administered medications on file prior to visit.    Review of Systems  Constitutional:  Negative for chills and fever.  Respiratory:  Negative for cough.   Cardiovascular:  Negative for chest pain and palpitations.  Gastrointestinal:  Negative for nausea and vomiting.  Genitourinary:  Positive for dysuria and flank pain.      Objective:    BP 118/76   Pulse 69   Temp 98.2 F (36.8 C) (Oral)   Ht 5' 6 (1.676 m)   Wt 144 lb 9.6 oz (65.6 kg)   SpO2 99%   BMI 23.34 kg/m   BP Readings from Last 3 Encounters:  03/20/24 118/76  02/13/24 122/70  01/24/24 122/76   Wt Readings from Last 3 Encounters:  03/20/24 144 lb 9.6  oz (65.6 kg)  02/13/24 146 lb (66.2 kg)  01/24/24 143 lb 12.8 oz (65.2 kg)    Physical Exam Vitals reviewed.  Constitutional:      Appearance: She is well-developed.  Cardiovascular:     Rate and Rhythm: Normal rate and regular rhythm.     Pulses: Normal pulses.     Heart sounds: Normal heart sounds.  Pulmonary:     Effort: Pulmonary effort is normal.     Breath sounds: Normal breath sounds. No wheezing, rhonchi or rales.  Abdominal:     Tenderness: There is no right CVA tenderness or left CVA tenderness.  Skin:    General: Skin is warm and dry.  Neurological:     Mental Status: She is alert.  Psychiatric:        Speech: Speech normal.        Behavior: Behavior normal.        Thought Content: Thought content normal.

## 2024-03-21 LAB — URINE CULTURE
MICRO NUMBER:: 16671222
Result:: NO GROWTH
SPECIMEN QUALITY:: ADEQUATE

## 2024-03-21 LAB — URINALYSIS, ROUTINE W REFLEX MICROSCOPIC
Bilirubin Urine: NEGATIVE
Glucose, UA: NEGATIVE
Hgb urine dipstick: NEGATIVE
Hyaline Cast: NONE SEEN /LPF
Ketones, ur: NEGATIVE
Nitrite: NEGATIVE
Protein, ur: NEGATIVE
RBC / HPF: NONE SEEN /HPF (ref 0–2)
Specific Gravity, Urine: 1.005 (ref 1.001–1.035)
Squamous Epithelial / HPF: NONE SEEN /HPF (ref <=5)
pH: 7 (ref 5.0–8.0)

## 2024-03-21 NOTE — Assessment & Plan Note (Deleted)
 Urinalysis positive for leukocytes, significant white blood cells.  Awaiting urine culture.  Given symptoms, advised to start Macrobid .  She will let me know how she is doing

## 2024-03-26 ENCOUNTER — Ambulatory Visit: Payer: Self-pay | Admitting: Family

## 2024-03-26 DIAGNOSIS — R300 Dysuria: Secondary | ICD-10-CM

## 2024-03-30 ENCOUNTER — Other Ambulatory Visit: Payer: Self-pay

## 2024-04-03 ENCOUNTER — Other Ambulatory Visit: Payer: Self-pay

## 2024-04-06 ENCOUNTER — Other Ambulatory Visit: Payer: Self-pay

## 2024-04-06 ENCOUNTER — Other Ambulatory Visit: Payer: Self-pay | Admitting: Pharmacy Technician

## 2024-04-06 NOTE — Progress Notes (Signed)
 Specialty Pharmacy Refill Coordination Note  Theresa [name] is a 68 y.o. female contacted today regarding refills of specialty medication(s) Denosumab  (PROLIA )   Patient requested Courier to Provider Office   Delivery date: 04/12/24   Verified address: [facility] [address] [place], [place] [address]   Medication will be filled on 04/11/24.  Patient stated she will call her MD about injection same time as lab on 8/4.

## 2024-04-07 ENCOUNTER — Other Ambulatory Visit: Payer: Self-pay | Admitting: Internal Medicine

## 2024-04-07 DIAGNOSIS — M792 Neuralgia and neuritis, unspecified: Secondary | ICD-10-CM

## 2024-04-07 DIAGNOSIS — B029 Zoster without complications: Secondary | ICD-10-CM

## 2024-04-10 ENCOUNTER — Other Ambulatory Visit: Payer: Self-pay

## 2024-04-11 ENCOUNTER — Telehealth: Payer: Self-pay

## 2024-04-11 ENCOUNTER — Other Ambulatory Visit: Payer: Self-pay

## 2024-04-11 NOTE — Telephone Encounter (Signed)
 Copied from CRM [id number]. Topic: Appointments - Scheduling Inquiry for Clinic >> Apr 11, 2024  9:07 AM Revonda D wrote: Reason for CRM: Pt is calling to get Prolia  shot scheduled for Monday 8/4. Pt stated that the shot should be arriving at the office tomorrow. Pt already has a lab appt scheduled on 8/4 at 11 and would like to add the Prolia  shot to the same appt if possible.

## 2024-04-13 NOTE — Telephone Encounter (Signed)
 Prolia  injection delivered 04/12/24.  Patient had a lab appointment scheduled on 04/16/24 and wanted to know if she could have Prolia  injection same day.  Last injection was 10/20/23.  Rescheduled to a nurse visit on 04/18/24.  Will have injection and give urine sample for urinalysis.  Patient aware of change in appointments and verbalizes understanding.

## 2024-04-16 ENCOUNTER — Other Ambulatory Visit

## 2024-04-18 ENCOUNTER — Telehealth: Payer: Self-pay | Admitting: Internal Medicine

## 2024-04-18 ENCOUNTER — Ambulatory Visit

## 2024-04-18 DIAGNOSIS — E785 Hyperlipidemia, unspecified: Secondary | ICD-10-CM

## 2024-04-18 DIAGNOSIS — E034 Atrophy of thyroid (acquired): Secondary | ICD-10-CM

## 2024-04-18 DIAGNOSIS — R7301 Impaired fasting glucose: Secondary | ICD-10-CM

## 2024-04-18 DIAGNOSIS — M810 Age-related osteoporosis without current pathological fracture: Secondary | ICD-10-CM | POA: Diagnosis not present

## 2024-04-18 DIAGNOSIS — I10 Essential (primary) hypertension: Secondary | ICD-10-CM

## 2024-04-18 MED ORDER — DENOSUMAB 60 MG/ML ~~LOC~~ SOSY: 60.0000 mg | PREFILLED_SYRINGE | SUBCUTANEOUS | Status: AC

## 2024-04-18 MED ORDER — DENOSUMAB 60 MG/ML ~~LOC~~ SOSY
60.0000 mg | PREFILLED_SYRINGE | SUBCUTANEOUS | Status: AC
  Administered 2024-04-18: 60 mg via SUBCUTANEOUS

## 2024-04-18 NOTE — Telephone Encounter (Signed)
 Patient would like to do labs before Nov appt, no lab orders

## 2024-04-18 NOTE — Progress Notes (Signed)
Pt presented today for a Prolia injection. Left arm, SQ. Pt voiced no concerns nor showed any signs of distress during injection.

## 2024-04-19 NOTE — Telephone Encounter (Signed)
 I have pended labs for pt to have done prior to her appt in November. If needed I will call pt and schedule her for a lab appt.

## 2024-04-19 NOTE — Addendum Note (Signed)
 Addended by: [name] on: 04/19/2024 04:33 PM   Modules accepted: Orders

## 2024-04-19 NOTE — Addendum Note (Signed)
 Addended by: Soraya Paquette on: 04/19/2024 03:34 PM   Modules accepted: Orders

## 2024-04-19 NOTE — Telephone Encounter (Signed)
 Pt received Prolia  on 8/6

## 2024-04-20 NOTE — Telephone Encounter (Signed)
 Pt called back and has been scheduled for lab appt on 07/19/2024.

## 2024-04-20 NOTE — Telephone Encounter (Signed)
 LMTCB. Need to schedule pt for a lab appt a few days before her next appt with Dr. Marylynn.

## 2024-04-23 ENCOUNTER — Encounter: Payer: Self-pay | Admitting: Family

## 2024-04-23 NOTE — Addendum Note (Signed)
 Addended by: BRIEN SHARENE RAMAN on: 04/23/2024 03:30 PM   Modules accepted: Orders

## 2024-04-24 ENCOUNTER — Other Ambulatory Visit

## 2024-04-24 DIAGNOSIS — R300 Dysuria: Secondary | ICD-10-CM

## 2024-04-25 LAB — URINALYSIS, ROUTINE W REFLEX MICROSCOPIC
Bilirubin Urine: NEGATIVE
Glucose, UA: NEGATIVE
Hgb urine dipstick: NEGATIVE
Ketones, ur: NEGATIVE
Leukocytes,Ua: NEGATIVE
Nitrite: NEGATIVE
Protein, ur: NEGATIVE
Specific Gravity, Urine: 1.003 (ref 1.001–1.035)
pH: 7 (ref 5.0–8.0)

## 2024-06-18 ENCOUNTER — Other Ambulatory Visit: Payer: Self-pay | Admitting: Internal Medicine

## 2024-06-18 DIAGNOSIS — Z1231 Encounter for screening mammogram for malignant neoplasm of breast: Secondary | ICD-10-CM

## 2024-07-19 ENCOUNTER — Other Ambulatory Visit (INDEPENDENT_AMBULATORY_CARE_PROVIDER_SITE_OTHER)

## 2024-07-19 DIAGNOSIS — E785 Hyperlipidemia, unspecified: Secondary | ICD-10-CM

## 2024-07-19 DIAGNOSIS — I10 Essential (primary) hypertension: Secondary | ICD-10-CM

## 2024-07-19 DIAGNOSIS — R7301 Impaired fasting glucose: Secondary | ICD-10-CM

## 2024-07-19 DIAGNOSIS — E034 Atrophy of thyroid (acquired): Secondary | ICD-10-CM

## 2024-07-20 LAB — COMPREHENSIVE METABOLIC PANEL WITH GFR
AG Ratio: 2.1 (calc) (ref 1.0–2.5)
ALT: 27 U/L (ref 6–29)
AST: 28 U/L (ref 10–35)
Albumin: 4.6 g/dL (ref 3.6–5.1)
Alkaline phosphatase (APISO): 44 U/L (ref 37–153)
BUN: 11 mg/dL (ref 7–25)
CO2: 30 mmol/L (ref 20–32)
Calcium: 9.1 mg/dL (ref 8.6–10.4)
Chloride: 104 mmol/L (ref 98–110)
Creat: 0.63 mg/dL (ref 0.50–1.05)
Globulin: 2.2 g/dL (ref 1.9–3.7)
Glucose, Bld: 87 mg/dL (ref 65–99)
Potassium: 4.7 mmol/L (ref 3.5–5.3)
Sodium: 141 mmol/L (ref 135–146)
Total Bilirubin: 0.5 mg/dL (ref 0.2–1.2)
Total Protein: 6.8 g/dL (ref 6.1–8.1)
eGFR: 98 mL/min/1.73m2 (ref >=60)

## 2024-07-20 LAB — LIPID PANEL
Cholesterol: 175 mg/dL (ref <200)
HDL: 88 mg/dL (ref >=50)
LDL Cholesterol (Calc): 72 mg/dL
Non-HDL Cholesterol (Calc): 87 mg/dL (ref <130)
Total CHOL/HDL Ratio: 2 (calc) (ref <5.0)
Triglycerides: 70 mg/dL (ref <150)

## 2024-07-20 LAB — TSH: TSH: 1.5 m[IU]/L (ref 0.40–4.50)

## 2024-07-20 LAB — LDL CHOLESTEROL, DIRECT: Direct LDL: 89 mg/dL (ref <100)

## 2024-07-20 LAB — MICROALBUMIN / CREATININE URINE RATIO
Creatinine, Urine: 22 mg/dL (ref 20–275)
Microalb, Ur: <0.2 mg/dL

## 2024-07-20 LAB — HEMOGLOBIN A1C
Hgb A1c MFr Bld: 5.6 % (ref <5.7)
Mean Plasma Glucose: 114 mg/dL
eAG (mmol/L): 6.3 mmol/L

## 2024-07-22 ENCOUNTER — Ambulatory Visit: Payer: Self-pay | Admitting: Internal Medicine

## 2024-07-26 ENCOUNTER — Encounter: Payer: Self-pay | Admitting: Internal Medicine

## 2024-07-26 ENCOUNTER — Ambulatory Visit: Admitting: Internal Medicine

## 2024-07-26 VITALS — BP 126/86 | HR 74 | Ht 66.0 in | Wt 145.8 lb

## 2024-07-26 DIAGNOSIS — E034 Atrophy of thyroid (acquired): Secondary | ICD-10-CM

## 2024-07-26 DIAGNOSIS — M87052 Idiopathic aseptic necrosis of left femur: Secondary | ICD-10-CM

## 2024-07-26 DIAGNOSIS — S129XXS Fracture of neck, unspecified, sequela: Secondary | ICD-10-CM | POA: Diagnosis not present

## 2024-07-26 DIAGNOSIS — I10 Essential (primary) hypertension: Secondary | ICD-10-CM

## 2024-07-26 DIAGNOSIS — M792 Neuralgia and neuritis, unspecified: Secondary | ICD-10-CM

## 2024-07-26 DIAGNOSIS — M5410 Radiculopathy, site unspecified: Secondary | ICD-10-CM | POA: Diagnosis not present

## 2024-07-26 DIAGNOSIS — B029 Zoster without complications: Secondary | ICD-10-CM

## 2024-07-26 MED ORDER — HYDROCODONE-ACETAMINOPHEN 5-325 MG PO TABS: 1.0000 | ORAL_TABLET | ORAL | 0 refills | Status: AC | PRN

## 2024-07-26 MED ORDER — LEVOTHYROXINE SODIUM 100 MCG PO TABS: 100.0000 ug | ORAL_TABLET | Freq: Every day | ORAL | 1 refills | Status: AC

## 2024-07-26 MED ORDER — TIZANIDINE HCL 4 MG PO TABS: 4.0000 mg | ORAL_TABLET | Freq: Four times a day (QID) | ORAL | 0 refills | Status: AC | PRN

## 2024-07-26 MED ORDER — GABAPENTIN 100 MG PO CAPS: 300.0000 mg | ORAL_CAPSULE | Freq: Three times a day (TID) | ORAL | 1 refills | Status: DC

## 2024-07-26 NOTE — Patient Instructions (Signed)
 You can increase the gabapentin  dose to 300 mg if it helps.  New rx sent.  Adding vicodin for use when pain is severe, Or to help you sleep more comfortably  Max out tylenol  and add muscle relaxer first   You can take 3000 mg of acetominophen (tylenol ) every day safely  In divided doses (750 mg  every 6 hours  Or 1000 mg every 8 hours.)   I will adjust the Vicodin dose and quantity based on your feedback.  Refills must be requested

## 2024-07-26 NOTE — Progress Notes (Signed)
 Subjective:  Patient ID: Theresa [name], female    DOB: [birth year]  Age: 68 y.o. MRN: [id number]  CC: The primary encounter diagnosis was Pseudoarthrosis of cervical spine, sequela. Diagnoses of Nerve pain, Radiculitis, Hypothyroidism due to acquired atrophy of thyroid , Avascular necrosis of left femoral head (HCC), and Essential hypertension were also pertinent to this visit.   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  Surgery deferred since she has already had cervical and lumbar surgery.   She is in constant pain  between her shoulder blades , which is normally managed with tylenol ,   but outdoor activities have aggravated her neack and thoracic pain.  Not sleeping well due to difficulty finding  ++a confortable position to sleep    The left flank pain in July eventually eased off after 2 weeks.   Outpatient Medications Prior to Visit  Medication Sig Dispense Refill   ALPRAZolam  (XANAX  XR) 0.5 MG 24 hr tablet TAKE 1 TABLET BY MOUTH AT BEDTIME AS NEEDED FOR ANXIETY 30 tablet 5   amLODipine  (NORVASC ) 5 MG tablet Take 1 tablet (5 mg total) by mouth daily. 90 tablet 2   aspirin  EC 325 MG tablet Take 1 tablet (325 mg total) by mouth daily.     carboxymethylcellulose (REFRESH TEARS) 0.5 % SOLN Place 1 drop into both eyes 4 (four) times daily.     cetirizine (ZYRTEC) 10 MG tablet Take 10 mg by mouth in the morning.     Cholecalciferol  (VITAMIN D3) 1000 units CAPS Take 1,000 Units by mouth at bedtime.     denosumab  (PROLIA ) 60 MG/ML SOSY injection Inject 60 mg into the skin every 6 (six) months. 1 mL 1   famotidine  (PEPCID ) 20 MG tablet Take 20 mg by mouth daily.     Magnesium  250 MG TABS Take 250 mg by mouth at bedtime.     Multiple Vitamin (MULTIVITAMIN WITH MINERALS) TABS  tablet Take 1 tablet by mouth every evening.     Probiotic Product (PROBIOTIC PEARLS WOMENS PO) Take 1 capsule by mouth daily after breakfast.     rosuvastatin  (CRESTOR ) 10 MG tablet Take 1 tablet (10 mg total) by mouth daily. 90 tablet 3   gabapentin  (NEURONTIN ) 100 MG capsule TAKE 2 CAPSULES BY MOUTH THREE TIMES DAILY 180 capsule 1   levothyroxine  (SYNTHROID ) 100 MCG tablet Take 1 tablet (100 mcg total) by mouth daily. 90 tablet 1   amoxicillin  (AMOXIL ) 500 MG tablet Take 4 tablets (2 grams/ 2000 mg total) by mouth one hour prior to dental procedure. (Patient not taking: Reported on 07/26/2024) 4 tablet 6   nitrofurantoin , macrocrystal-monohydrate, (MACROBID ) 100 MG capsule Take 1 capsule (100 mg total) by mouth 2 (two) times daily. Take with food. 10 capsule 0   Facility-Administered Medications Prior to Visit  Medication Dose Route Frequency Provider Last Rate Last Admin   [START ON 10/19/2024] denosumab  (PROLIA ) injection 60 mg  60 mg Subcutaneous Q6 months [name], MD        Review of Systems;  Patient denies headache, fevers, malaise, unintentional weight loss, skin rash, eye pain, sinus congestion and sinus pain, sore throat, dysphagia,  hemoptysis , cough, dyspnea, wheezing, chest pain, palpitations, orthopnea, edema, abdominal pain, nausea, melena, diarrhea, constipation,  flank pain, dysuria, hematuria, urinary  Frequency, nocturia, numbness, tingling, seizures,  Focal weakness, Loss of consciousness,  Tremor, insomnia, depression, anxiety, and suicidal ideation.      Objective:  BP 126/86   Pulse 74   Ht 5' 6 (1.676 m)   Wt 145 lb 12.8 oz (66.1 kg)   SpO2 98%   BMI 23.53 kg/m   BP Readings from Last 3 Encounters:  07/26/24 126/86  03/20/24 118/76  02/13/24 122/70    Wt Readings from Last 3 Encounters:  07/26/24 145 lb 12.8 oz (66.1 kg)  03/20/24 144 lb 9.6 oz (65.6 kg)  02/13/24 146 lb (66.2 kg)    Physical Exam Vitals reviewed.  Constitutional:       General: She is not in acute distress.    Appearance: Normal appearance. She is normal weight. She is not ill-appearing, toxic-appearing or diaphoretic.  HENT:     Head: Normocephalic.  Eyes:     General: No scleral icterus.       Right eye: No discharge.        Left eye: No discharge.     Conjunctiva/sclera: Conjunctivae normal.  Cardiovascular:     Rate and Rhythm: Normal rate and regular rhythm.     Heart sounds: Normal heart sounds.  Pulmonary:     Effort: Pulmonary effort is normal. No respiratory distress.     Breath sounds: Normal breath sounds.  Musculoskeletal:     Cervical back: Muscular tenderness present. Decreased range of motion.  Skin:    General: Skin is warm and dry.  Neurological:     General: No focal deficit present.     Mental Status: She is alert and oriented to person, place, and time. Mental status is at baseline.  Psychiatric:        Mood and Affect: Mood normal.        Behavior: Behavior normal.        Thought Content: Thought content normal.        Judgment: Judgment normal.    Lab Results  Component Value Date   HGBA1C 5.6 07/19/2024   HGBA1C 5.6 01/17/2024   HGBA1C 5.4 09/22/2022    Lab Results  Component Value Date   CREATININE 0.63 07/19/2024   CREATININE 0.82 02/13/2024   CREATININE 0.75 01/17/2024    Lab Results  Component Value Date   WBC 7.0 01/17/2024   HGB 13.8 01/17/2024   HCT 41.7 01/17/2024   PLT 229 01/17/2024   GLUCOSE 87 07/19/2024   CHOL 175 07/19/2024   TRIG 70 07/19/2024   HDL 88 07/19/2024   LDLDIRECT 89 07/19/2024   LDLCALC 72 07/19/2024   ALT 27 07/19/2024   AST 28 07/19/2024   NA 141 07/19/2024   K 4.7 07/19/2024   CL 104 07/19/2024   CREATININE 0.63 07/19/2024   BUN 11 07/19/2024   CO2 30 07/19/2024   TSH 1.50 07/19/2024   INR 1.4 (H) 09/24/2022   HGBA1C 5.6 07/19/2024   MICROALBUR <0.2 07/19/2024    MM 3D SCREENING MAMMOGRAM BILATERAL BREAST   Assessment & Plan:  .Pseudoarthrosis of cervical  spine, sequela Assessment & Plan: 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  Pain management needed .  Adding muscle relaxer  and hydrocodone     Nerve pain -     Gabapentin ; Take 3 capsules (300 mg total) by mouth 3 (three) times daily.  Dispense: 270 capsule; Refill: 1  Radiculitis -     Gabapentin ; Take 3 capsules (300  mg total) by mouth 3 (three) times daily.  Dispense: 270 capsule; Refill: 1  Hypothyroidism due to acquired atrophy of thyroid  -     Levothyroxine  Sodium; Take 1 tablet (100 mcg total) by mouth daily.  Dispense: 90 tablet; Refill: 1  Avascular necrosis of left femoral head (HCC) Assessment & Plan: s/p left hip replacement in [place] 2023 by Dr Mardee .  She is ambulating without hip pain.    Essential hypertension Assessment & Plan: Well controlled on current regimen of amlodipine   5 mg  . Renal function stable, no changes today.   Lab Results  Component Value Date   MICROALBUR <0.2 07/19/2024   MICROALBUR <0.2 01/17/2024     Lab Results  Component Value Date   CREATININE 0.63 07/19/2024   Lab Results  Component Value Date   NA 141 07/19/2024   K 4.7 07/19/2024   CL 104 07/19/2024   CO2 30 07/19/2024      Other orders -     HYDROcodone -Acetaminophen ; Take 1 tablet by mouth every 4 (four) hours as needed for up to 7 days for moderate pain (pain score 4-6).  Dispense: 30 tablet; Refill: 0 -     tiZANidine HCl; Take 1 tablet (4 mg total) by mouth every 6 (six) hours as needed for muscle spasms.  Dispense: 30 tablet; Refill: 0    I personally spent a total of 33 minutes in the care of the patient today including getting/reviewing separately obtained history, performing a medically appropriate exam/evaluation, counseling and educating, placing orders, documenting clinical information in the EHR, independently interpreting results, and communicating results.  Follow-up: Return in about 3 months (around 10/26/2024) for chronic pain  management.   Verneita LITTIE Kettering, MD

## 2024-07-28 NOTE — Assessment & Plan Note (Signed)
 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  Pain management needed .  Adding muscle relaxer  and hydrocodone 

## 2024-07-28 NOTE — Assessment & Plan Note (Signed)
 s/p left hip replacement in [place] 2023 by Dr Mardee .  She is ambulating without hip pain.

## 2024-07-28 NOTE — Assessment & Plan Note (Addendum)
 Well controlled on current regimen of amlodipine   5 mg  . Renal function stable, no changes today.   Lab Results  Component Value Date   MICROALBUR <0.2 07/19/2024   MICROALBUR <0.2 01/17/2024     Lab Results  Component Value Date   CREATININE 0.63 07/19/2024   Lab Results  Component Value Date   NA 141 07/19/2024   K 4.7 07/19/2024   CL 104 07/19/2024   CO2 30 07/19/2024

## 2024-08-07 ENCOUNTER — Ambulatory Visit (INDEPENDENT_AMBULATORY_CARE_PROVIDER_SITE_OTHER): Admitting: *Deleted

## 2024-08-07 VITALS — BP 115/82 | Ht 66.0 in | Wt 145.0 lb

## 2024-08-07 DIAGNOSIS — Z0000 Encounter for general adult medical examination without abnormal findings: Secondary | ICD-10-CM | POA: Diagnosis not present

## 2024-08-07 NOTE — Progress Notes (Signed)
 Chief Complaint  Patient presents with   Medicare Wellness     Subjective:   Theresa [name] is a 68 y.o. female who presents for a Medicare Annual Wellness Visit.  Allergies (verified) Alendronate, Mobic [meloxicam], Topamax [topiramate], and Tramadol   History: Past Medical History:  Diagnosis Date   Allergy    Anxiety    Aortic regurgitation 02/17/2018   Noted by cardiology,  moderate to severe by repeat ECHO Nov 2023.     Arthritis    Atrial fibrillation (HCC) 10/04/2022   S/P AVR   Breast screening, unspecified    Cataract    Clotting disorder 10/2017   From use of anti-inflammatories   Essential hypertension 01/22/2020   H/O: Bell's palsy    recurrent   Headache    Heart murmur    History of kidney stones    Hypercalcemia    Neuromuscular disorder (HCC) 02/2000   Osteoporosis    Screening for malignant neoplasm of the cervix    Special screening for malignant neoplasms, colon    Unspecified hypothyroidism    thyroid  nodule   Unspecified otitis media    Past Surgical History:  Procedure Laterality Date   ANTERIOR CERVICAL DECOMP/DISCECTOMY FUSION  13,15   C3 ,4 ,5 [place]   ANTERIOR FUSION CERVICAL SPINE  2013   [facility]  c5-c6   AORTIC VALVE REPLACEMENT N/A 09/24/2022   Procedure: AORTIC VALVE REPLACEMENT (AVR) USING EDWARDS INSPIRIS RESILIA 23 MM AORTIC VALVE;  Surgeon: Lucas Dorise POUR, MD;  Location: [facility] OR;  Service: Open Heart Surgery;  Laterality: N/A;  Median sternotomy   CARDIAC VALVE REPLACEMENT  09/24/22   CHOLECYSTECTOMY  1989   dental implant     JOINT REPLACEMENT  08/31/2021   Total L hip   MENISCUS REPAIR Right 01/2010   [facility]   RIGHT/LEFT HEART CATH AND CORONARY ANGIOGRAPHY N/A 09/01/2022   Procedure: RIGHT/LEFT HEART CATH AND CORONARY ANGIOGRAPHY;  Surgeon: Wonda Sharper, MD;  Location: [facility] INVASIVE CV LAB;  Service: Cardiovascular;  Laterality: N/A;   SPINE SURGERY  12/13, 2/15 and 12/18   [place], C3, 4 5   TEE WITHOUT CARDIOVERSION  N/A 09/24/2022   Procedure: TRANSESOPHAGEAL ECHOCARDIOGRAM (TEE);  Surgeon: Lucas Dorise POUR, MD;  Location: [facility] OR;  Service: Open Heart Surgery;  Laterality: N/A;   TOTAL ABDOMINAL HYSTERECTOMY     secondary to fibroids still has ovaries   TOTAL HIP ARTHROPLASTY Left 08/31/2021   Procedure: TOTAL HIP ARTHROPLASTY;  Surgeon: Mardee Lynwood SQUIBB, MD;  Location: [facility] ORS;  Service: Orthopedics;  Laterality: Left;   Family History  Problem Relation Age of Onset   Thyroid  nodules Mother    Alzheimer's disease Mother    Hyperlipidemia Father    Hearing loss Father    Vision loss Father    Varicose Veins Father    Breast cancer Paternal Aunt    Cancer Paternal Aunt    Thyroid  nodules Sister    Cancer Sister 36       in situ   Breast cancer Sister 48   Arthritis Sister    Thyroid  nodules Maternal Grandmother    Social History   Occupational History   Occupation: armed forces operational officer  Tobacco Use   Smoking status: Never   Smokeless tobacco: Never   Tobacco comments:    Subjected to second hand smoke until late teens  Vaping Use   Vaping status: Never Used  Substance and Sexual Activity   Alcohol  use: Yes    Alcohol /week: 1.0 standard  drink of alcohol     Types: 1 Glasses of wine per week    Comment: 3 times a week   Drug use: No   Sexual activity: Yes    Birth control/protection: Surgical    Comment: Hysterectomy 02/1998   Tobacco Counseling Counseling given: Not Answered Tobacco comments: Subjected to second hand smoke until late teens  SDOH Screenings   Food Insecurity: No Food Insecurity (08/07/2024)  Housing: Low Risk  (08/07/2024)  Transportation Needs: No Transportation Needs (08/07/2024)  Utilities: Not At Risk (08/07/2024)  Alcohol  Screen: Low Risk  (08/07/2024)  Depression (PHQ2-9): Low Risk  (08/07/2024)  Financial Resource Strain: Low Risk  (08/07/2024)  Physical Activity: Sufficiently Active (08/07/2024)  Social Connections: Moderately Isolated (08/07/2024)   Stress: No Stress Concern Present (08/07/2024)  Recent Concern: Stress - Stress Concern Present (06/23/2024)  Tobacco Use: Low Risk  (08/07/2024)  Health Literacy: Adequate Health Literacy (08/07/2024)   See flowsheets for full screening details  Depression Screen PHQ 2 & 9 Depression Scale- Over the past 2 weeks, how often have you been bothered by any of the following problems? Little interest or pleasure in doing things: 0 Feeling down, depressed, or hopeless (PHQ Adolescent also includes...irritable): 0 PHQ-2 Total Score: 0 Trouble falling or staying asleep, or sleeping too much: 1 Feeling tired or having little energy: 1 Poor appetite or overeating (PHQ Adolescent also includes...weight loss): 0 Feeling bad about yourself - or that you are a failure or have let yourself or your family down: 0 Trouble concentrating on things, such as reading the newspaper or watching television (PHQ Adolescent also includes...like school work): 0 Moving or speaking so slowly that other people could have noticed. Or the opposite - being so fidgety or restless that you have been moving around a lot more than usual: 0 Thoughts that you would be better off dead, or of hurting yourself in some way: 0 PHQ-9 Total Score: 2 If you checked off any problems, how difficult have these problems made it for you to do your work, take care of things at home, or get along with other people?: Somewhat difficult     Goals Addressed             This Visit's Progress    Patient Stated       Wants to lose a little weight       Visit info / Clinical Intake: Medicare Wellness Visit Type:: Subsequent Annual Wellness Visit Persons participating in visit:: patient Medicare Wellness Visit Mode:: Video Because this visit was a virtual/telehealth visit:: pt reported vitals If Telephone or Video please confirm:: I connected with the patient using audio enabled telemedicine application and verified that I am speaking  with the correct person using two identifiers; I discussed the limitations of evaluation and management by telemedicine; The patient expressed understanding and agreed to proceed Patient Location:: Home Provider Location:: Office/Home Information given by:: patient Interpreter Needed?: No Pre-visit prep was completed: yes AWV questionnaire completed by patient prior to visit?: no Living arrangements:: lives with spouse/significant other Patient's Overall Health Status Rating: good Typical amount of pain: (!) a lot Does pain affect daily life?: (!) yes Are you currently prescribed opioids?: (!) yes  Dietary Habits and Nutritional Risks How many meals a day?: 2 Eats fruit and vegetables daily?: (!) no Most meals are obtained by: preparing own meals In the last 2 weeks, have you had any of the following?: none Diabetic:: no  Functional Status Activities of Daily Living (to include ambulation/medication):  Independent Ambulation: Independent Medication Administration: Independent Home Management: Independent Manage your own finances?: yes Primary transportation is: driving Concerns about vision?: no *vision screening is required for WTM* Concerns about hearing?: no  Fall Screening Falls in the past year?: 0 Number of falls in past year: 0 Was there an injury with Fall?: 0 Fall Risk Category Calculator: 0 Patient Fall Risk Level: Low Fall Risk  Fall Risk Patient at Risk for Falls Due to: No Fall Risks Fall risk Follow up: Falls evaluation completed; Falls prevention discussed  Home and Transportation Safety: All rugs have non-skid backing?: yes All stairs or steps have railings?: yes Grab bars in the bathtub or shower?: (!) no Have non-skid surface in bathtub or shower?: yes Good home lighting?: yes Regular seat belt use?: yes Hospital stays in the last year:: no  Cognitive Assessment Difficulty concentrating, remembering, or making decisions? : no Will 6CIT or Mini Cog  be Completed: yes What year is it?: 0 points What month is it?: 0 points Give patient an address phrase to remember (5 components): [address] [place] About what time is it?: 0 points Count backwards from 20 to 1: 0 points Say the months of the year in reverse: 0 points Repeat the address phrase from earlier: 0 points 6 CIT Score: 0 points  Advance Directives (For Healthcare) Does Patient Have a Medical Advance Directive?: Yes Does patient want to make changes to medical advance directive?: No - Patient declined Type of Advance Directive: Healthcare Power of [place]; Living will Copy of Healthcare Power of Attorney in Chart?: Yes - validated most recent copy scanned in chart (See row information) Copy of Living Will in Chart?: Yes - validated most recent copy scanned in chart (See row information) Would patient like information on creating a medical advance directive?: Yes (MAU/Ambulatory/Procedural Areas - Information given)  Reviewed/Updated  Reviewed/Updated: Reviewed All (Medical, Surgical, Family, Medications, Allergies, Care Teams, Patient Goals)        Objective:    Today's Vitals   08/07/24 1013  BP: 115/82  Weight: 145 lb (65.8 kg)  Height: 5' 6 (1.676 m)   Body mass index is 23.4 kg/m.  Current Medications (verified) Outpatient Encounter Medications as of 08/07/2024  Medication Sig   ALPRAZolam  (XANAX  XR) 0.5 MG 24 hr tablet TAKE 1 TABLET BY MOUTH AT BEDTIME AS NEEDED FOR ANXIETY   amLODipine  (NORVASC ) 5 MG tablet Take 1 tablet (5 mg total) by mouth daily.   aspirin  EC 325 MG tablet Take 1 tablet (325 mg total) by mouth daily.   carboxymethylcellulose (REFRESH TEARS) 0.5 % SOLN Place 1 drop into both eyes 4 (four) times daily.   cetirizine (ZYRTEC) 10 MG tablet Take 10 mg by mouth in the morning.   Cholecalciferol  (VITAMIN D3) 1000 units CAPS Take 1,000 Units by mouth at bedtime.   denosumab  (PROLIA ) 60 MG/ML SOSY injection Inject 60 mg into the skin  every 6 (six) months.   famotidine  (PEPCID ) 20 MG tablet Take 20 mg by mouth daily.   gabapentin  (NEURONTIN ) 100 MG capsule Take 3 capsules (300 mg total) by mouth 3 (three) times daily.   HYDROcodone -acetaminophen  (NORCO/VICODIN) 5-325 MG tablet Take 1 tablet by mouth every 4 (four) hours as needed for moderate pain (pain score 4-6).   levothyroxine  (SYNTHROID ) 100 MCG tablet Take 1 tablet (100 mcg total) by mouth daily.   Magnesium  250 MG TABS Take 250 mg by mouth at bedtime.   Multiple Vitamin (MULTIVITAMIN WITH MINERALS) TABS tablet Take 1  tablet by mouth every evening.   Probiotic Product (PROBIOTIC PEARLS WOMENS PO) Take 1 capsule by mouth daily after breakfast.   rosuvastatin  (CRESTOR ) 10 MG tablet Take 1 tablet (10 mg total) by mouth daily.   tiZANidine  (ZANAFLEX ) 4 MG tablet Take 1 tablet (4 mg total) by mouth every 6 (six) hours as needed for muscle spasms.   Facility-Administered Encounter Medications as of 08/07/2024  Medication   [START ON 10/19/2024] denosumab  (PROLIA ) injection 60 mg   Hearing/Vision screen Hearing Screening - Comments:: No issues Vision Screening - Comments:: Glasses, [place] [facility], up to date Immunizations and Health Maintenance Health Maintenance  Topic Date Due   Mammogram  08/22/2024   COVID-19 Vaccine (6 - 2025-26 season) 08/11/2024 (Originally 05/14/2024)   Medicare Annual Wellness (AWV)  08/07/2025   Colonoscopy  04/30/2027   DTaP/Tdap/Td (3 - Td or Tdap) 10/14/2031   Pneumococcal Vaccine: 50+ Years  Completed   Influenza Vaccine  Completed   Bone Density Scan  Completed   Hepatitis C Screening  Completed   Zoster Vaccines- Shingrix   Completed   Meningococcal B Vaccine  Aged Out        Assessment/Plan:  This is a routine wellness examination for [place].  Patient Care Team: [name], MD as PCP - General (Internal Medicine) Okey Vina GAILS, MD as PCP - Cardiology (Cardiology) Mardee, Lynwood SQUIBB, MD as Consulting Physician (Orthopedic  Surgery)  I have personally reviewed and noted the following in the patient's chart:   Medical and social history Use of alcohol , tobacco or illicit drugs  Current medications and supplements including opioid prescriptions. Functional ability and status Nutritional status Physical activity Advanced directives List of other physicians Hospitalizations, surgeries, and ER visits in previous 12 months Vitals Screenings to include cognitive, depression, and falls Referrals and appointments  No orders of the defined types were placed in this encounter.  In addition, I have reviewed and discussed with patient certain preventive protocols, quality metrics, and best practice recommendations. A written personalized care plan for preventive services as well as general preventive health recommendations were provided to patient.   Angeline Fredericks, LPN   [id number]   Return in 1 year (on 08/07/2025).  After Visit Summary: (MyChart) Due to this being a telephonic visit, the after visit summary with patients personalized plan was offered to patient via MyChart   Nurse Notes: Patient declines covid vaccine.

## 2024-08-07 NOTE — Patient Instructions (Addendum)
 Theresa [name],  Thank you for taking the time for your Medicare Wellness Visit. I appreciate your continued commitment to your health goals. Please review the care plan we discussed, and feel free to reach out if I can assist you further.  Please note that Annual Wellness Visits do not include a physical exam. Some assessments may be limited, especially if the visit was conducted virtually. If needed, we may recommend an in-person follow-up with your provider.  Ongoing Care Seeing your primary care provider every 3 to 6 months helps us  monitor your health and provide consistent, personalized care.  Remember to keep your vaccines up to date.  Managing Pain Without Opioids Opioids are strong medicines used to treat moderate to severe pain. For some people, especially those who have long-term (chronic) pain, opioids may not be the best choice for pain management due to: Side effects like nausea, constipation, and sleepiness. The risk of addiction (opioid use disorder). The longer you take opioids, the greater your risk of addiction. Pain that lasts for more than 3 months is called chronic pain. Managing chronic pain usually requires more than one approach and is often provided by a team of health care providers working together (multidisciplinary approach). Pain management may be done at a pain management center or pain clinic. How to manage pain without the use of opioids Use non-opioid medicines Non-opioid medicines for pain may include: Over-the-counter or prescription non-steroidal anti-inflammatory drugs (NSAIDs). These may be the first medicines used for pain. They work well for muscle and bone pain, and they reduce swelling. Acetaminophen . This over-the-counter medicine may work well for milder pain but not swelling. Antidepressants. These may be used to treat chronic pain. A certain type of antidepressant (tricyclics) is often used. These medicines are given in lower doses for pain than when used  for depression. Anticonvulsants. These are usually used to treat seizures but may also reduce nerve (neuropathic) pain. Muscle relaxants. These relieve pain caused by sudden muscle tightening (spasms). You may also use a pain medicine that is applied to the skin as a patch, cream, or gel (topical analgesic), such as a numbing medicine. These may cause fewer side effects than medicines taken by mouth. Do certain therapies as directed Some therapies can help with pain management. They include: Physical therapy. You will do exercises to gain strength and flexibility. A physical therapist may teach you exercises to move and stretch parts of your body that are weak, stiff, or painful. You can learn these exercises at physical therapy visits and practice them at home. Physical therapy may also involve: Massage. Heat wraps or applying heat or cold to affected areas. Electrical signals that interrupt pain signals (transcutaneous electrical nerve stimulation, TENS). Weak lasers that reduce pain and swelling (low-level laser therapy). Signals from your body that help you learn to regulate pain (biofeedback). Occupational therapy. This helps you to learn ways to function at home and work with less pain. Recreational therapy. This involves trying new activities or hobbies, such as a physical activity or drawing. Mental health therapy, including: Cognitive behavioral therapy (CBT). This helps you learn coping skills for dealing with pain. Acceptance and commitment therapy (ACT) to change the way you think and react to pain. Relaxation therapies, including muscle relaxation exercises and mindfulness-based stress reduction. Pain management counseling. This may be individual, family, or group counseling.  Receive medical treatments Medical treatments for pain management include: Nerve block injections. These may include a pain blocker and anti-inflammatory medicines. You may have injections: Near  the spine to  relieve chronic back or neck pain. Into joints to relieve back or joint pain. Into nerve areas that supply a painful area to relieve body pain. Into muscles (trigger point injections) to relieve some painful muscle conditions. A medical device placed near your spine to help block pain signals and relieve nerve pain or chronic back pain (spinal cord stimulation device). Acupuncture. Follow these instructions at home Medicines Take over-the-counter and prescription medicines only as told by your health care provider. If you are taking pain medicine, ask your health care providers about possible side effects to watch out for. Do not drive or use heavy machinery while taking prescription opioid pain medicine. Lifestyle  Do not use drugs or alcohol  to reduce pain. If you drink alcohol , limit how much you have to: 0-1 drink a day for women who are not pregnant. 0-2 drinks a day for men. Know how much alcohol  is in a drink. In the [place] one drink equals one 12 oz bottle of beer (355 mL), one 5 oz glass of wine (148 mL), or one 1 oz glass of hard liquor (44 mL). Do not use any products that contain nicotine or tobacco. These products include cigarettes, chewing tobacco, and vaping devices, such as e-cigarettes. If you need help quitting, ask your health care provider. Eat a healthy diet and maintain a healthy weight. Poor diet and excess weight may make pain worse. Eat foods that are high in fiber. These include fresh fruits and vegetables, whole grains, and beans. Limit foods that are high in fat and processed sugars, such as fried and sweet foods. Exercise regularly. Exercise lowers stress and may help relieve pain. Ask your health care provider what activities and exercises are safe for you. If your health care provider approves, join an exercise class that combines movement and stress reduction. Examples include yoga and tai chi. Get enough sleep. Lack of sleep may make pain worse. Lower stress  as much as possible. Practice stress reduction techniques as told by your therapist. General instructions Work with all your pain management providers to find the treatments that work best for you. You are an important member of your pain management team. There are many things you can do to reduce pain on your own. Consider joining an online or in-person support group for people who have chronic pain. Keep all follow-up visits. This is important. Where to find more information You can find more information about managing pain without opioids from: American Academy of Pain Medicine: painmed.org Institute for Chronic Pain: instituteforchronicpain.org American Chronic Pain Association: theacpa.org Contact a health care provider if: You have side effects from pain medicine. Your pain gets worse or does not get better with treatments or home therapy. You are struggling with anxiety or depression. Summary Many types of pain can be managed without opioids. Chronic pain may respond better to pain management without opioids. Pain is best managed when you and a team of health care providers work together. Pain management without opioids may include non-opioid medicines, medical treatments, physical therapy, mental health therapy, and lifestyle changes. Tell your health care providers if your pain gets worse or is not being managed well enough. This information is not intended to replace advice given to you by your health care provider. Make sure you discuss any questions you have with your health care provider. Document Revised: 12/10/2020 Document Reviewed: 12/10/2020 Elsevier Patient Education  2024 Elsevier Inc.  Referrals If a referral was made during today's visit and you  haven't received any updates within two weeks, please contact the referred provider directly to check on the status.  Recommended Screenings:  Health Maintenance  Topic Date Due   Breast Cancer Screening  08/22/2024    COVID-19 Vaccine (6 - 2025-26 season) 08/11/2024*   Medicare Annual Wellness Visit  08/07/2025   Colon Cancer Screening  04/30/2027   DTaP/Tdap/Td vaccine (3 - Td or Tdap) 10/14/2031   Pneumococcal Vaccine for age over 45  Completed   Flu Shot  Completed   Osteoporosis screening with Bone Density Scan  Completed   Hepatitis C Screening  Completed   Zoster (Shingles) Vaccine  Completed   Meningitis B Vaccine  Aged Out  *Topic was postponed. The date shown is not the original due date.       08/07/2024   10:17 AM  Advanced Directives  Does Patient Have a Medical Advance Directive? Yes  Type of Estate Agent of [place];Living will  Does patient want to make changes to medical advance directive? No - Patient declined  Copy of Healthcare Power of Attorney in Chart? Yes - validated most recent copy scanned in chart (See row information)    Vision: Annual vision screenings are recommended for early detection of glaucoma, cataracts, and diabetic retinopathy. These exams can also reveal signs of chronic conditions such as diabetes and high blood pressure.  Dental: Annual dental screenings help detect early signs of oral cancer, gum disease, and other conditions linked to overall health, including heart disease and diabetes.  Please see the attached documents for additional preventive care recommendations.

## 2024-08-08 ENCOUNTER — Other Ambulatory Visit: Payer: Self-pay | Admitting: Internal Medicine

## 2024-08-14 NOTE — Progress Notes (Signed)
 I connected with  Elianie Hubers on 08/07/24 by a video and audio enabled telemedicine application and verified that I am speaking with the correct person using two identifiers.  Patient Location: Home  Provider Location: Home Office  Persons Participating in Visit: Patient.  I discussed the limitations of evaluation and management by telemedicine. The patient expressed understanding and agreed to proceed.   Vital Signs: Because this visit was a virtual/telehealth visit, some criteria may be missing or patient reported. Any vitals not documented were not able to be obtained and vitals that have been documented are patient reported.

## 2024-08-20 ENCOUNTER — Encounter: Payer: Self-pay | Admitting: Internal Medicine

## 2024-08-22 NOTE — Assessment & Plan Note (Signed)
 Currently using tylenol  1000 mg tid ,  gabapentin  300 mg tid,  and rare prn use of hydrocoone.  MR stopped due to dizziness.

## 2024-08-23 ENCOUNTER — Inpatient Hospital Stay: Admission: RE | Admit: 2024-08-23 | Discharge: 2024-08-23 | Attending: Internal Medicine | Admitting: Internal Medicine

## 2024-08-23 DIAGNOSIS — Z1231 Encounter for screening mammogram for malignant neoplasm of breast: Secondary | ICD-10-CM | POA: Diagnosis present

## 2024-09-25 ENCOUNTER — Telehealth: Payer: Self-pay

## 2024-09-25 NOTE — Telephone Encounter (Signed)
 Prolia  VOB initiated via [email or web address]  Next Prolia  inj DUE: 10/19/24

## 2024-09-26 ENCOUNTER — Other Ambulatory Visit (HOSPITAL_COMMUNITY): Payer: Self-pay

## 2024-09-26 NOTE — Telephone Encounter (Signed)
 Theresa [name]

## 2024-09-26 NOTE — Telephone Encounter (Signed)
 Pt ready for scheduling for PROLIA  on or after : 10/19/24  Option# 1: Buy/Bill (Office supplied medication)  Out-of-pocket cost due at time of clinic visit: $0  Number of injection/visits approved: ---  Primary: MEDICARE Prolia  co-insurance: 0% Admin fee co-insurance: 0%  Secondary: [employer] Prolia  co-insurance: covers the Medicare Part B coinsurance and deductible. Admin fee co-insurance:   Medical Benefit Details: Date Benefits were checked: 09/25/24 Deductible: $0 Met of $283 Required/ Coinsurance: 0%/ Admin Fee: 0%  Prior Auth: N/A PA# Expiration Date:   # of doses approved: ----------------------------------------------------------------------- Option# 2- Med Obtained from pharmacy:  Pharmacy benefit: Copay $--- (Paid to pharmacy) Admin Fee: --- (Pay at clinic)  Prior Auth: RTS-NEXT FILL 10/08/24 PA# Expiration Date:   # of doses approved:   If patient wants fill through the pharmacy benefit please send prescription to: ---, and include estimated need by date in rx notes. Pharmacy will ship medication directly to the office.  Patient NOT eligible for Prolia  Copay Card. Copay Card can make patient's cost as little as $25. Link to apply: [email or web address]://[email or web address]  ** This summary of benefits is an estimation of the patient's out-of-pocket cost. Exact cost may very based on individual plan coverage.

## 2024-10-01 ENCOUNTER — Other Ambulatory Visit: Payer: Self-pay

## 2024-10-01 ENCOUNTER — Other Ambulatory Visit: Payer: Self-pay | Admitting: Internal Medicine

## 2024-10-01 DIAGNOSIS — M810 Age-related osteoporosis without current pathological fracture: Secondary | ICD-10-CM

## 2024-10-02 ENCOUNTER — Other Ambulatory Visit: Payer: Self-pay

## 2024-10-02 ENCOUNTER — Ambulatory Visit (HOSPITAL_COMMUNITY)
Admission: RE | Admit: 2024-10-02 | Discharge: 2024-10-02 | Disposition: A | Discharge status: Home / Self Care | Source: Ambulatory Visit | Attending: Cardiology | Admitting: Cardiology

## 2024-10-02 DIAGNOSIS — Z952 Presence of prosthetic heart valve: Secondary | ICD-10-CM | POA: Insufficient documentation

## 2024-10-02 DIAGNOSIS — I359 Nonrheumatic aortic valve disorder, unspecified: Secondary | ICD-10-CM | POA: Insufficient documentation

## 2024-10-03 ENCOUNTER — Other Ambulatory Visit: Payer: Self-pay

## 2024-10-03 LAB — ECHOCARDIOGRAM COMPLETE
AR max vel: 1.53 cm2
AV Area VTI: 1.52 cm2
AV Area mean vel: 1.61 cm2
AV Mean grad: 17 mmHg
AV Peak grad: 32 mmHg
Ao pk vel: 2.83 m/s
Area-P 1/2: 2.85 cm2
S' Lateral: 2.8 cm

## 2024-10-03 MED ORDER — PROLIA 60 MG/ML ~~LOC~~ SOSY
60.0000 mg | PREFILLED_SYRINGE | SUBCUTANEOUS | 1 refills | Status: AC
  Filled 2024-10-12: qty 1, 180d supply, fill #0

## 2024-10-04 ENCOUNTER — Other Ambulatory Visit: Payer: Self-pay

## 2024-10-04 ENCOUNTER — Ambulatory Visit: Payer: Self-pay | Admitting: Internal Medicine

## 2024-10-04 ENCOUNTER — Other Ambulatory Visit: Payer: Self-pay | Admitting: Internal Medicine

## 2024-10-04 DIAGNOSIS — M5410 Radiculopathy, site unspecified: Secondary | ICD-10-CM

## 2024-10-04 DIAGNOSIS — M792 Neuralgia and neuritis, unspecified: Secondary | ICD-10-CM

## 2024-10-04 NOTE — Progress Notes (Signed)
 Benefits investigation complete. See telephone encounter from 09/25/24. Insurance will not pay until 1/26. Waiting until then to submit test claim. Medical benefits shows $0

## 2024-10-08 ENCOUNTER — Other Ambulatory Visit: Payer: Self-pay

## 2024-10-08 ENCOUNTER — Other Ambulatory Visit: Payer: Self-pay | Admitting: Internal Medicine

## 2024-10-08 DIAGNOSIS — M810 Age-related osteoporosis without current pathological fracture: Secondary | ICD-10-CM

## 2024-10-09 ENCOUNTER — Other Ambulatory Visit: Payer: Self-pay

## 2024-10-11 ENCOUNTER — Other Ambulatory Visit: Payer: Self-pay

## 2024-10-12 ENCOUNTER — Telehealth: Payer: Self-pay

## 2024-10-12 ENCOUNTER — Other Ambulatory Visit: Payer: Self-pay

## 2024-10-12 NOTE — Progress Notes (Signed)
 Specialty Pharmacy Refill Coordination Note  Theresa [name] is a 69 y.o. female contacted today regarding refills of specialty medication(s) Denosumab  (PROLIA )   Patient requested Courier to Provider Office   Delivery date: 10/18/24   Verified address: [place] at [address]   Medication will be filled on: 10/17/24

## 2024-10-12 NOTE — Telephone Encounter (Signed)
 Copied from CRM #[id number]. Topic: Clinical - Medication Question >> Oct 11, 2024  2:22 PM Lonell PEDLAR wrote: Reason for CRM: Amy with the [facility] is requesting clarification on injection for recently denied rx denosumab  (PROLIA ) 60 MG/ML SOSY injection  Please contact to advise [phone]

## 2024-10-15 ENCOUNTER — Other Ambulatory Visit: Payer: Self-pay

## 2024-10-16 ENCOUNTER — Other Ambulatory Visit: Payer: Self-pay

## 2024-10-17 ENCOUNTER — Other Ambulatory Visit: Payer: Self-pay

## 2024-10-17 ENCOUNTER — Telehealth: Payer: Self-pay

## 2024-10-17 ENCOUNTER — Telehealth: Payer: Self-pay | Admitting: Internal Medicine

## 2024-10-17 NOTE — Telephone Encounter (Signed)
 Patient is calling to see if labs are needed before 10/24/24 appt.

## 2024-10-17 NOTE — Telephone Encounter (Signed)
 Copied from CRM [id number]. Topic: General - Other >> Oct 17, 2024 11:21 AM Theresa [name] wrote: Reason for CRM: Patient called to inform the office that per the speciality pharmacy her Prolia  injection will be delivered to the office tomorrow.

## 2024-10-17 NOTE — Telephone Encounter (Signed)
Noted - see previous message

## 2024-10-18 NOTE — Telephone Encounter (Signed)
 Called and spoke with pt, Prolia  is to be delivered today.  Pt would like to get her injection 10/30/24 when she is seeing Dr. Tullo for an appt.  Appointment note updated.

## 2024-10-24 ENCOUNTER — Ambulatory Visit: Admitting: Internal Medicine

## 2024-10-26 ENCOUNTER — Ambulatory Visit: Admitting: Internal Medicine

## 2024-10-30 ENCOUNTER — Ambulatory Visit: Admitting: Internal Medicine

## 2025-08-13 ENCOUNTER — Ambulatory Visit
